# Patient Record
Sex: Female | Born: 1935 | Race: Black or African American | Hispanic: No | Smoking: Former smoker
Health system: Southern US, Community
[De-identification: ages and names within clinical notes are randomized; demographics above are authoritative.]

## PROBLEM LIST (undated history)

## (undated) DIAGNOSIS — M1990 Unspecified osteoarthritis, unspecified site: Secondary | ICD-10-CM

## (undated) DIAGNOSIS — G629 Polyneuropathy, unspecified: Secondary | ICD-10-CM

## (undated) DIAGNOSIS — E785 Hyperlipidemia, unspecified: Secondary | ICD-10-CM

## (undated) DIAGNOSIS — R011 Cardiac murmur, unspecified: Secondary | ICD-10-CM

## (undated) DIAGNOSIS — E039 Hypothyroidism, unspecified: Secondary | ICD-10-CM

## (undated) DIAGNOSIS — K5792 Diverticulitis of intestine, part unspecified, without perforation or abscess without bleeding: Secondary | ICD-10-CM

## (undated) DIAGNOSIS — R0600 Dyspnea, unspecified: Secondary | ICD-10-CM

## (undated) DIAGNOSIS — Z972 Presence of dental prosthetic device (complete) (partial): Secondary | ICD-10-CM

## (undated) DIAGNOSIS — Z950 Presence of cardiac pacemaker: Secondary | ICD-10-CM

## (undated) DIAGNOSIS — M109 Gout, unspecified: Secondary | ICD-10-CM

## (undated) DIAGNOSIS — K219 Gastro-esophageal reflux disease without esophagitis: Secondary | ICD-10-CM

## (undated) DIAGNOSIS — I509 Heart failure, unspecified: Secondary | ICD-10-CM

## (undated) DIAGNOSIS — F32A Depression, unspecified: Secondary | ICD-10-CM

## (undated) DIAGNOSIS — F329 Major depressive disorder, single episode, unspecified: Secondary | ICD-10-CM

## (undated) DIAGNOSIS — I10 Essential (primary) hypertension: Secondary | ICD-10-CM

## (undated) DIAGNOSIS — K56609 Unspecified intestinal obstruction, unspecified as to partial versus complete obstruction: Secondary | ICD-10-CM

## (undated) DIAGNOSIS — I38 Endocarditis, valve unspecified: Secondary | ICD-10-CM

## (undated) HISTORY — PX: ABDOMINAL SURGERY: SHX537

## (undated) HISTORY — PX: THYROIDECTOMY: SHX17

## (undated) HISTORY — PX: HERNIA REPAIR: SHX51

## (undated) HISTORY — PX: COLON SURGERY: SHX602

## (undated) HISTORY — PX: PACEMAKER INSERTION: SHX728

## (undated) HISTORY — PX: ABDOMINAL HYSTERECTOMY: SHX81

---

## 2004-04-09 ENCOUNTER — Ambulatory Visit: Payer: Self-pay | Admitting: Gastroenterology

## 2004-10-24 ENCOUNTER — Ambulatory Visit: Payer: Self-pay | Admitting: Family Medicine

## 2005-01-08 ENCOUNTER — Ambulatory Visit: Payer: Self-pay | Admitting: Family Medicine

## 2005-11-16 ENCOUNTER — Emergency Department: Payer: Self-pay | Admitting: Internal Medicine

## 2005-11-16 ENCOUNTER — Other Ambulatory Visit: Payer: Self-pay

## 2006-04-16 ENCOUNTER — Ambulatory Visit: Payer: Self-pay | Admitting: Family Medicine

## 2007-05-20 ENCOUNTER — Ambulatory Visit: Payer: Self-pay | Admitting: Family Medicine

## 2008-05-30 ENCOUNTER — Ambulatory Visit: Payer: Self-pay | Admitting: Cardiology

## 2008-07-19 ENCOUNTER — Ambulatory Visit: Payer: Self-pay | Admitting: Family Medicine

## 2009-01-08 ENCOUNTER — Observation Stay: Payer: Self-pay | Admitting: Internal Medicine

## 2009-07-20 ENCOUNTER — Ambulatory Visit: Payer: Self-pay | Admitting: Family Medicine

## 2014-03-29 ENCOUNTER — Inpatient Hospital Stay: Payer: Self-pay | Admitting: Surgery

## 2014-03-29 LAB — CBC WITH DIFFERENTIAL/PLATELET
BASOS ABS: 0.3 10*3/uL — AB (ref 0.0–0.1)
Basophil %: 1.8 %
Eosinophil #: 0.1 10*3/uL (ref 0.0–0.7)
Eosinophil %: 0.6 %
HCT: 46.7 % (ref 35.0–47.0)
HGB: 14.8 g/dL (ref 12.0–16.0)
LYMPHS ABS: 0.6 10*3/uL — AB (ref 1.0–3.6)
Lymphocyte %: 3.1 %
MCH: 30.7 pg (ref 26.0–34.0)
MCHC: 31.7 g/dL — ABNORMAL LOW (ref 32.0–36.0)
MCV: 97 fL (ref 80–100)
MONO ABS: 0.9 x10 3/mm (ref 0.2–0.9)
MONOS PCT: 4.8 %
Neutrophil #: 16.2 10*3/uL — ABNORMAL HIGH (ref 1.4–6.5)
Neutrophil %: 89.7 %
PLATELETS: 252 10*3/uL (ref 150–440)
RBC: 4.82 10*6/uL (ref 3.80–5.20)
RDW: 13.5 % (ref 11.5–14.5)
WBC: 18.1 10*3/uL — ABNORMAL HIGH (ref 3.6–11.0)

## 2014-03-29 LAB — COMPREHENSIVE METABOLIC PANEL
ALBUMIN: 4.1 g/dL (ref 3.4–5.0)
Alkaline Phosphatase: 78 U/L
Anion Gap: 10 (ref 7–16)
BUN: 18 mg/dL (ref 7–18)
Bilirubin,Total: 0.7 mg/dL (ref 0.2–1.0)
CALCIUM: 9.9 mg/dL (ref 8.5–10.1)
CO2: 20 mmol/L — AB (ref 21–32)
CREATININE: 1.35 mg/dL — AB (ref 0.60–1.30)
Chloride: 103 mmol/L (ref 98–107)
EGFR (African American): 48 — ABNORMAL LOW
EGFR (Non-African Amer.): 39 — ABNORMAL LOW
Glucose: 171 mg/dL — ABNORMAL HIGH (ref 65–99)
OSMOLALITY: 272 (ref 275–301)
POTASSIUM: 4.2 mmol/L (ref 3.5–5.1)
SGOT(AST): 22 U/L (ref 15–37)
SGPT (ALT): 20 U/L
SODIUM: 133 mmol/L — AB (ref 136–145)
Total Protein: 8.6 g/dL — ABNORMAL HIGH (ref 6.4–8.2)

## 2014-03-29 LAB — LIPASE, BLOOD: Lipase: 154 U/L (ref 73–393)

## 2014-03-29 LAB — TROPONIN I: Troponin-I: <0.02 ng/mL

## 2014-03-30 LAB — URINALYSIS, COMPLETE
Bacteria: NONE SEEN
Bilirubin,UR: NEGATIVE
GLUCOSE, UR: NEGATIVE mg/dL (ref 0–75)
Hyaline Cast: 2
Ketone: NEGATIVE
LEUKOCYTE ESTERASE: NEGATIVE
Nitrite: NEGATIVE
PROTEIN: NEGATIVE
Ph: 5 (ref 4.5–8.0)
RBC,UR: 47 /HPF (ref 0–5)
Specific Gravity: 1.026 (ref 1.003–1.030)
Squamous Epithelial: <1
WBC UR: 8 /HPF (ref 0–5)

## 2014-03-30 LAB — CBC WITH DIFFERENTIAL/PLATELET
Basophil #: 0 10*3/uL (ref 0.0–0.1)
Basophil %: 0.2 %
Eosinophil #: 0 10*3/uL (ref 0.0–0.7)
Eosinophil %: 0.1 %
HCT: 36.5 % (ref 35.0–47.0)
HGB: 12 g/dL (ref 12.0–16.0)
Lymphocyte #: 1.2 10*3/uL (ref 1.0–3.6)
Lymphocyte %: 10.3 %
MCH: 31.4 pg (ref 26.0–34.0)
MCHC: 32.8 g/dL (ref 32.0–36.0)
MCV: 96 fL (ref 80–100)
Monocyte #: 1.6 x10 3/mm — ABNORMAL HIGH (ref 0.2–0.9)
Monocyte %: 14 %
NEUTROS PCT: 75.4 %
Neutrophil #: 8.6 10*3/uL — ABNORMAL HIGH (ref 1.4–6.5)
PLATELETS: 216 10*3/uL (ref 150–440)
RBC: 3.82 10*6/uL (ref 3.80–5.20)
RDW: 13.5 % (ref 11.5–14.5)
WBC: 11.4 10*3/uL — ABNORMAL HIGH (ref 3.6–11.0)

## 2014-03-30 LAB — BASIC METABOLIC PANEL
Anion Gap: 7 (ref 7–16)
BUN: 24 mg/dL — AB (ref 7–18)
CREATININE: 1.43 mg/dL — AB (ref 0.60–1.30)
Calcium, Total: 8.2 mg/dL — ABNORMAL LOW (ref 8.5–10.1)
Chloride: 109 mmol/L — ABNORMAL HIGH (ref 98–107)
Co2: 26 mmol/L (ref 21–32)
EGFR (Non-African Amer.): 37 — ABNORMAL LOW
GFR CALC AF AMER: 45 — AB
GLUCOSE: 110 mg/dL — AB (ref 65–99)
Osmolality: 288 (ref 275–301)
POTASSIUM: 3.9 mmol/L (ref 3.5–5.1)
SODIUM: 142 mmol/L (ref 136–145)

## 2014-04-03 LAB — PLATELET COUNT: Platelet: 265 10*3/uL (ref 150–440)

## 2014-04-03 LAB — CBC WITH DIFFERENTIAL/PLATELET
BASOS PCT: 0.1 %
Basophil #: 0 10*3/uL (ref 0.0–0.1)
Eosinophil #: 0 10*3/uL (ref 0.0–0.7)
Eosinophil %: 0.1 %
HCT: 37.1 % (ref 35.0–47.0)
HGB: 11.9 g/dL — ABNORMAL LOW (ref 12.0–16.0)
Lymphocyte #: 1.1 10*3/uL (ref 1.0–3.6)
Lymphocyte %: 7.8 %
MCH: 30.5 pg (ref 26.0–34.0)
MCHC: 32.2 g/dL (ref 32.0–36.0)
MCV: 95 fL (ref 80–100)
MONOS PCT: 14.4 %
Monocyte #: 2 x10 3/mm — ABNORMAL HIGH (ref 0.2–0.9)
Neutrophil #: 10.9 10*3/uL — ABNORMAL HIGH (ref 1.4–6.5)
Neutrophil %: 77.6 %
Platelet: 271 10*3/uL (ref 150–440)
RBC: 3.92 10*6/uL (ref 3.80–5.20)
RDW: 12.9 % (ref 11.5–14.5)
WBC: 14.1 10*3/uL — AB (ref 3.6–11.0)

## 2014-04-03 LAB — BASIC METABOLIC PANEL
ANION GAP: 12 (ref 7–16)
BUN: 19 mg/dL — AB (ref 7–18)
Calcium, Total: 8.5 mg/dL (ref 8.5–10.1)
Chloride: 105 mmol/L (ref 98–107)
Co2: 24 mmol/L (ref 21–32)
Creatinine: 0.9 mg/dL (ref 0.60–1.30)
EGFR (Non-African Amer.): >60
GLUCOSE: 110 mg/dL — AB (ref 65–99)
Osmolality: 284 (ref 275–301)
Potassium: 3.3 mmol/L — ABNORMAL LOW (ref 3.5–5.1)
Sodium: 141 mmol/L (ref 136–145)

## 2014-04-03 LAB — PHOSPHORUS: PHOSPHORUS: 2.5 mg/dL (ref 2.5–4.9)

## 2014-04-03 LAB — MAGNESIUM: Magnesium: 1.7 mg/dL — ABNORMAL LOW

## 2014-04-04 LAB — CBC WITH DIFFERENTIAL/PLATELET
Bands: 1 %
HCT: 33.6 % — ABNORMAL LOW (ref 35.0–47.0)
HGB: 10.9 g/dL — AB (ref 12.0–16.0)
LYMPHS PCT: 10 %
MCH: 30.9 pg (ref 26.0–34.0)
MCHC: 32.4 g/dL (ref 32.0–36.0)
MCV: 96 fL (ref 80–100)
Monocytes: 16 %
Platelet: 259 10*3/uL (ref 150–440)
RBC: 3.52 10*6/uL — AB (ref 3.80–5.20)
RDW: 13.3 % (ref 11.5–14.5)
Segmented Neutrophils: 73 %
WBC: 16 10*3/uL — ABNORMAL HIGH (ref 3.6–11.0)

## 2014-04-04 LAB — BASIC METABOLIC PANEL
ANION GAP: 10 (ref 7–16)
BUN: 20 mg/dL — ABNORMAL HIGH (ref 7–18)
CALCIUM: 8 mg/dL — AB (ref 8.5–10.1)
CREATININE: 0.81 mg/dL (ref 0.60–1.30)
Chloride: 105 mmol/L (ref 98–107)
Co2: 26 mmol/L (ref 21–32)
Glucose: 93 mg/dL (ref 65–99)
Osmolality: 284 (ref 275–301)
Potassium: 3 mmol/L — ABNORMAL LOW (ref 3.5–5.1)
Sodium: 141 mmol/L (ref 136–145)

## 2014-04-05 LAB — BASIC METABOLIC PANEL
ANION GAP: 9 (ref 7–16)
BUN: 18 mg/dL (ref 7–18)
CALCIUM: 8.1 mg/dL — AB (ref 8.5–10.1)
CHLORIDE: 106 mmol/L (ref 98–107)
CO2: 27 mmol/L (ref 21–32)
Creatinine: 0.81 mg/dL (ref 0.60–1.30)
EGFR (Non-African Amer.): >60
GLUCOSE: 105 mg/dL — AB (ref 65–99)
OSMOLALITY: 285 (ref 275–301)
Potassium: 3.4 mmol/L — ABNORMAL LOW (ref 3.5–5.1)
Sodium: 142 mmol/L (ref 136–145)

## 2014-04-06 LAB — CBC WITH DIFFERENTIAL/PLATELET
Basophil #: 0.1 10*3/uL (ref 0.0–0.1)
Basophil %: 0.4 %
Eosinophil #: 0.4 10*3/uL (ref 0.0–0.7)
Eosinophil %: 2 %
HCT: 34.4 % — ABNORMAL LOW (ref 35.0–47.0)
HGB: 11.2 g/dL — ABNORMAL LOW (ref 12.0–16.0)
Lymphocyte #: 2.1 10*3/uL (ref 1.0–3.6)
Lymphocyte %: 10.9 %
MCH: 31 pg (ref 26.0–34.0)
MCHC: 32.5 g/dL (ref 32.0–36.0)
MCV: 95 fL (ref 80–100)
Monocyte #: 2.7 x10 3/mm — ABNORMAL HIGH (ref 0.2–0.9)
Monocyte %: 14.1 %
Neutrophil #: 13.7 10*3/uL — ABNORMAL HIGH (ref 1.4–6.5)
Neutrophil %: 72.6 %
Platelet: 354 10*3/uL (ref 150–440)
RBC: 3.61 10*6/uL — ABNORMAL LOW (ref 3.80–5.20)
RDW: 13.1 % (ref 11.5–14.5)
WBC: 18.9 10*3/uL — ABNORMAL HIGH (ref 3.6–11.0)

## 2014-04-06 LAB — MAGNESIUM: Magnesium: 1.9 mg/dL

## 2014-04-06 LAB — POTASSIUM: Potassium: 3.6 mmol/L (ref 3.5–5.1)

## 2014-08-03 ENCOUNTER — Ambulatory Visit
Admit: 2014-08-03 | Disposition: A | Discharge status: Home / Self Care | Payer: Self-pay | Attending: Surgery | Admitting: Surgery

## 2014-08-16 ENCOUNTER — Ambulatory Visit
Admit: 2014-08-16 | Disposition: A | Discharge status: Home / Self Care | Payer: Self-pay | Attending: Surgery | Admitting: Surgery

## 2014-08-26 NOTE — Discharge Summary (Signed)
PATIENT NAME:  Vanessa CorwinBENSON, Kadesha M MR#:  [id number] DATE OF BIRTH:  Aug 01, 1922  DATE OF ADMISSION:  03/29/2014 DATE OF DISCHARGE: 04/06/2014  FINAL DIAGNOSES: Incarcerated right femoral hernia with a small bowel obstruction, hypertension, coronary artery disease.   PRINCIPAL PROCEDURE: Exploratory laparotomy with small bowel resection and McVay repair of incarcerated right femoral hernia.   CONSULTS: Medicine consultation.   HOSPITAL COURSE SUMMARY: The patient was brought to the operating room urgently the day of her admission. Her postoperative course was unremarkable except for some nausea and vomiting. X-rays were unrevealing. The patient had hypertension, and was seen by internal medicine. No real changes were made. Blood pressure was reasonably controlled. The patient continued to improve. On postoperative day number 7, she was tolerating a regular diet and had oral pain medications by mouth. The wound was healing nicely and she was participating in physical therapy and was deemed a suitable candidate for rehabilitation transfer.   DISCHARGE MEDICATIONS: Medication reconciliation form was done.   FOLLOWUP INSTRUCTIONS: Follow up in our office in 1 week for staple removal. Call with any questions or concerns.   CONDITION ON DISCHARGE: Stable and improved.     ____________________________ Redge GainerMark A. Egbert GaribaldiBird, MD mab:JT D: 04/06/2014 08:54:14 ET T: 04/06/2014 09:04:18 ET JOB#: [id number]  cc: Loraine LericheMark A. Egbert GaribaldiBird, MD, <Dictator> Deniesha Stenglein A Doriann Zuch MD ELECTRONICALLY SIGNED 04/08/2014 17:10

## 2014-08-26 NOTE — Consult Note (Signed)
PATIENT NAME:  Vanessa CorwinBENSON, Crissa M MR#:  [id number] DATE OF BIRTH:  November 17, 1922  DATE OF CONSULTATION:  04/03/2014  REFERRING PHYSICIAN:   CONSULTING PHYSICIAN:  Katha HammingSnehalatha Tara Wich, MD  PRIMARY DOCTOR:  Dr. Terance HartBronstein.    CONSULT REQUESTING PHYSICIAN:  Dr. Egbert GaribaldiBird.    REASON FOR CONSULT: Management of hypertension.   HISTORY OF PRESENT ILLNESS: An 81 year old female patient admitted to surgical service on November 25 for incarcerated inguinal hernia. The patient admitted to surgical service and she had an exploratory laparotomy with hernia repair and small bowel resection on November 25. The patient is on morphine PCA for pain control and IV fluids. We were consulted for management of hypertension. The patient's blood pressure has been around 115/80 and 164/80.  It is overall well controlled since yesterday, but since this morning it is a little bit elevated at 167/80. The patient says that she is feeling more nauseous today than previous days. Unable to eat anything for 5 days.  Does not have abdominal pain, and able to pass flatus.    MEDICATIONS:  The patient's hypertensive medications includes lisinopril 40 mg daily, Imdur 30 mg daily, atenolol 50 mg daily, amlodipine 5 mg a day at this time.    ALLERGIES:  INDERAL.    SOCIAL HISTORY:  No smoking.  No drinking.  The patient lives alone.   PAST SURGICAL HISTORY: Significant for hysterectomy, pacemaker placement. She had history of thyroidectomy.   FAMILY HISTORY: No hypertension or diabetes.   REVIEW OF SYSTEMS:    CONSTITUTIONAL: The patient denies any fever.  EARS, NOSE, AND THROAT: The patient denies any ear pain. No epistaxis. No difficulty swallowing.  CARDIOVASCULAR: No chest pain. No palpitations or syncope.  PULMONOLOGY: Denies any trouble breathing.  GASTROINTESTINAL:  Has nausea and decreased p.o. intake. Denies abdominal pain. She complains of soreness only.  GENITOURINARY:  No dysuria.  ENDOCRINE: No polyuria or nocturia.   PSYCHIATRIC: No anxiety or insomnia.  NEUROLOGIC: No history of strokes.   PHYSICAL EXAMINATION:   VITAL SIGNS:  Temperature 98.8, heart rate 64, blood pressure 164/80, saturation 95% on 1 liter.  GENERAL:  She is alert, awake, oriented, pleasant female, slightly in distress secondary to nausea, able to questions appropriately.  HEAD: Atraumatic, normocephalic.  EYES: Pupils equal, reacting to light. Extraocular movements are intact.  EARS, NOSE, AND THROAT: No tympanic membrane congestion. No turbinate hypertrophy. No erythema.   NECK: Supple. No JVD.  No carotid bruit.  CARDIOVASCULAR: S1, S2 regular. No murmurs.  LUNGS: Clear to auscultation. No wheeze, no rales.  ABDOMEN:  The patient abdominal mesh present. Bowel sounds are diminished. Nontender, nondistended.  EXTREMITIES: No extremity edema. No cyanosis, no clubbing.  NEUROLOGIC: Alert, awake, oriented. Cranial nerves II through XII intact. Power 5 out of 5 in upper and lower extremities. Sensation is intact. DTRs 2 + bilaterally.  PSYCHIATRIC: Mood and affect are within normal limits.   LABORATORY DATA: The patient laboratories done on November 26, white count was 11.4. Electrolytes at that time showed sodium of 142, potassium 3.9, chloride 109, bicarbonate 26, BUN 24, creatinine 1.43. No recent laboratories are available.  The patient had abdominal x-ray today and it showed   Improved bowel gas pattern, but possible continued dilation ofthe stomach. Consider NG tube decompression. 3. No free air identified.  ASSESSMENT AND PLAN:  1.  This is an 81 year old female with hypertension. The patient right now is on atenolol and amlodipine, lisinopril and Imdur.  I would recommend to continue the same dose  at this time and watch and make further recommendations accordingly. Blood pressure is slightly elevated likely secondary to her nausea.  2.  Small incarcerated hernia status post repair. Right now she is off morphine PCA since today  morning and continue Reglan, PPIs and nausea medication and see how she does.  We could increase amlodipine to 10 mg depending on the blood pressure, if blood pressure is high we  could increase to 10 daily from tomorrow. At this time continue same dose of lisinopril, Imdur, atenolol, and amlodipine.  Obtain followup laboratories of CBC and  BMP  for tomorrow.  TIME SPENT ON THE CONSULTATION: 55 minutes.     ____________________________ Katha Hamming, MD sk:bu D: 04/03/2014 12:51:01 ET T: 04/03/2014 13:19:26 ET JOB#: [id number]  cc: Katha Hamming, MD, <Dictator> Katha Hamming MD ELECTRONICALLY SIGNED 04/29/2014 19:41

## 2014-08-26 NOTE — Op Note (Signed)
PATIENT NAME:  Vanessa Lang, FOISTER MR#:  [id number] DATE OF BIRTH:  02-03-1923  DATE OF PROCEDURE:  03/29/2014  ATTENDING SURGEON:  Dr. Salome Holmes.   PREOPERATIVE DIAGNOSIS: Small bowel obstruction due to incarcerated right inguinal hernia.   POSTOPERATIVE DIAGNOSIS:  Small bowel obstruction with strangulated right femoral hernia.   PROCEDURES PERFORMED:   1.  Primary right inguinal and femoral hernia repair using McVay technique for strangulated hernia.  2.  Exploratory laparotomy.   3.  Small bowel resection.    INDICATION FOR SURGERY:  Vanessa Lang is a pleasant 81 year old female who presented with right groin pain, nausea, and vomiting. She had a CT scan which was concerning for a small bowel obstruction secondary to incarcerated right inguinal hernia.   ANESTHESIA: General.   ESTIMATED BLOOD LOSS: 50 mL.   COMPLICATIONS: None.   SPECIMENS: Strangulated small bowel with necrosis.   DETAILS OF PROCEDURE: Informed consent was obtained.  Vanessa Lang was brought to the operating suite.  She was induced, endotracheal tube was placed.  General anesthesia was administered and her right groin and abdomen were prepped and draped in standard surgical fashion. A timeout was then performed, correctly identifying the patient name, operative site, and procedure to be performed.  An incision was made over her right groin.  It was deepened down to the fascia, the aponeurosis of the external oblique underneath the  inguinal ligament. There was a hard area of incarcerated what was thought to be bowel. The sac was dissected out. There was a purple-brown-black knuckle of bowel in her right lower quadrant. Her femoral canal aponeurosis was opened.  The round ligament was ligated and the floor of the canal was opened and the peritoneum was opened.  I did encounter a piece of bowel which was unable to be reduced.  The inguinal ligament was ligated, but it still was unable to be reduce.  I was concerned with the  questionable viability of the bowel and concern that resection would have to occur.  I thus made a midline incision and this was deepened down to the fascia. The fascia was incised. The peritoneum was entered. I then followed the small bowel to the area of strangulation with some work. I was able to reduce the small bowel.  Next the attention was made on repairing the hernia.  Because of the possible necrosis of small bowel a decision was made not to use mesh.  I then repaired the inguinal ligament using interrupted 3-0 Prolene.  I then performed a McVay tissue repair suturing the conjoined tendon to the [employer] ligament. I used a running 0 Prolene and ran, continued this closure to the shelving edge of the inguinal ligament which was quite destroyed due to the chronicity of the femoral hernia. I then made a transition stitch between the conjoined tendon and Cooper ligament and the shelving edge of the inguinal ligament and then continued the closure by joining the shelving edge to the conjoined tendon. The internal ring was closed. I did feel a small defect at the inferior medial aspect of my closure and did a simple interrupted 0 Ethibond to close the defect between the [place] ligament and the conjoined tendon. After I was satisfied with the closure I closed the aponeurosis of the external oblique using a running 3-0 Vicryl. I then closed Scarpa fascia with a running 3-0 Vicryl. I then closed the wound with stitches. Sterile dressing was then placed over the wound. I then proceeded to look at the abdomen  and investigate the incarcerated knuckle of bowel that was black and purple and I was afraid of its viability, therefore I opted to do a small bowel resection. A side-to-side functional end-to-end small bowel resection of the strangulated necrotic bowel was performed. It was excised using 2 staple loads, the GIA 75 with a blue load stapler at the proximal and distal aspects of it, the mesentery was ligated with  Kelly clamps and suture ties. The specimen was then sent off. I then brought the 2 ends together in proximity side by side. I placed 2 stay sutures of 3-0 Vicryl to hold them in place and then removed the tips of the staple line to provide an enterotomy. A GIA 75 load was used to create a common channel. I then used a TX 60 to close the common enterotomy.  The mesenteric defect was quite small and  I did not close it.  I then returned the bowel to the abdomen. The abdomen was then irrigated with multiple liters of normal saline and then the fascia was closed with a running looped PDS ran from cephalad to caudate and then tied to itself. The wound was then stapled. Sterile dressings were then placed over both wounds. The patient was then awoken, extubated, and brought to the postanesthesia care unit. There were no immediate complications. Needle, sponge, and instrument counts were correct at the end of the procedure.    ____________________________ Si Raiderhristopher A. Sandrina Heaton, MD cal:bu D: 03/30/2014 09:05:00 ET T: 03/30/2014 12:54:23 ET JOB#: [id number]  cc: Cristal Deerhristopher A. Cassandria Drew, MD, <Dictator> Jarvis NewcomerHRISTOPHER A Zarya Lasseigne MD ELECTRONICALLY SIGNED 04/10/2014 20:10

## 2014-08-26 NOTE — Consult Note (Signed)
Brief Consult Note: Diagnosis: 81 yr old female with i incarcerated ingunal hernia s/p repair, medical ocnsult for Hypertension management.   Patient was seen by consultant.   Consult note dictated.   Recommend to proceed with surgery or procedure.   Comments: 81 yr old female with Hypertension;overall well controlled,slighly elevated today likley due to Intractable nausea; continue amlodipine 5mg  daily,atenolol 50 mg po daily,lisinopril 40 mg po dailu and imdur 30 mg po daily.can increase amlodipine to 5mg  if BP is persistently high; monitor labs.  Electronic Signatures: Katha HammingKonidena, Giavanni Odonovan (MD)  (Signed [phone]-Nov-15 12:57)  Authored: Brief Consult Note   Last Updated: 30-Nov-15 12:57 by Katha HammingKonidena, Lyndsy Gilberto (MD)

## 2014-08-26 NOTE — H&P (Signed)
   Subjective/Chief Complaint Abdominal pain/Nausea/vomiting   History of Present Illness Ms. Vanessa Lang is Lang pleasant 81 yo F with Lang PMH of CAD, HTN and prior pacemaker placement who presents with approx 1 day of RLQ pain.  Began acutely and has not improved.  Began N/V at approx 1 am this am.  No fevers/chills.  CT shows Right inguinal hernia with incarcerated bowel with transition point.  No sick contacts, no unusual ingestions.   Past History H/O CAD H/o pacemaker HTN H/o appendectomy H/o thyroid surgery H/o hysterectomy   Code Status Full Code   Past Med/Surgical Hx:  Pacemaker:   Hypothyroidism:   Silent MI:   HTN:   Appendectomy:   thyroid surgery:   hysterectomy:   ALLERGIES:  Inderal: Itching, Rash  Family and Social History:  Family History Non-Contributory   Social History negative tobacco, negative ETOH   Place of Living Home   Review of Systems:  Subjective/Chief Complaint RLQ pain, N/V, right groin bulge   Fever/Chills No   Cough No   Abdominal Pain Yes   Diarrhea No   Constipation No   Nausea/Vomiting Yes   SOB/DOE No   Chest Pain No   Dysuria No   Tolerating Diet No  Nauseated  Vomiting   Physical Exam:  GEN well developed, well nourished, no acute distress   HEENT pink conjunctivae, PERRL, good dentition   RESP normal resp effort  clear BS  no use of accessory muscles   CARD regular rate  no murmur  no thrills   ABD positive tenderness  positive hernia  soft  distended  no Abdominal Bruits  no Adominal Mass  Unreducible right groin bulge   LYMPH negative neck, negative axillae   EXTR negative cyanosis/clubbing, negative edema   SKIN normal to palpation, No rashes   NEURO cranial nerves intact, follows commands, strength:, motor/sensory function intact   PSYCH Lang+O to time, place, person, good insight    Assessment/Admission Diagnosis 81 yo with R groin pain/N/V.  CT shows SBO from incarcerated right inguinal hernia, possibly  femoral on exam.  Elevated WBC, concern for incarceration/strangulation.   Plan Admit, plan on OR for emergent repair of incarcerated possibly strangulated hernia   Electronic Signatures: Vanessa Lang, Vanessa Lang (MD)  (Signed [phone]-Nov-15 13:35)  Authored: CHIEF COMPLAINT and HISTORY, PAST MEDICAL/SURGIAL HISTORY, ALLERGIES, FAMILY AND SOCIAL HISTORY, REVIEW OF SYSTEMS, PHYSICAL EXAM, ASSESSMENT AND PLAN   Last Updated: 25-Nov-15 13:35 by Vanessa Lang, Vanessa Lang (MD)

## 2014-08-28 LAB — SURGICAL PATHOLOGY

## 2015-05-26 ENCOUNTER — Inpatient Hospital Stay
Admission: EM | Admit: 2015-05-26 | Discharge: 2015-05-29 | DRG: 378 | Disposition: A | Discharge status: Home / Self Care | Payer: Medicare Other | Attending: Internal Medicine | Admitting: Internal Medicine

## 2015-05-26 ENCOUNTER — Encounter: Payer: Self-pay | Admitting: Internal Medicine

## 2015-05-26 DIAGNOSIS — Z8249 Family history of ischemic heart disease and other diseases of the circulatory system: Secondary | ICD-10-CM

## 2015-05-26 DIAGNOSIS — K5791 Diverticulosis of intestine, part unspecified, without perforation or abscess with bleeding: Principal | ICD-10-CM | POA: Diagnosis present

## 2015-05-26 DIAGNOSIS — E861 Hypovolemia: Secondary | ICD-10-CM | POA: Diagnosis present

## 2015-05-26 DIAGNOSIS — I10 Essential (primary) hypertension: Secondary | ICD-10-CM | POA: Diagnosis present

## 2015-05-26 DIAGNOSIS — Z79899 Other long term (current) drug therapy: Secondary | ICD-10-CM

## 2015-05-26 DIAGNOSIS — E785 Hyperlipidemia, unspecified: Secondary | ICD-10-CM | POA: Diagnosis present

## 2015-05-26 DIAGNOSIS — I5032 Chronic diastolic (congestive) heart failure: Secondary | ICD-10-CM | POA: Diagnosis present

## 2015-05-26 DIAGNOSIS — E890 Postprocedural hypothyroidism: Secondary | ICD-10-CM | POA: Diagnosis present

## 2015-05-26 DIAGNOSIS — Z888 Allergy status to other drugs, medicaments and biological substances status: Secondary | ICD-10-CM | POA: Diagnosis not present

## 2015-05-26 DIAGNOSIS — N179 Acute kidney failure, unspecified: Secondary | ICD-10-CM | POA: Diagnosis present

## 2015-05-26 DIAGNOSIS — D62 Acute posthemorrhagic anemia: Secondary | ICD-10-CM | POA: Diagnosis present

## 2015-05-26 DIAGNOSIS — N189 Chronic kidney disease, unspecified: Secondary | ICD-10-CM | POA: Diagnosis present

## 2015-05-26 DIAGNOSIS — Z87891 Personal history of nicotine dependence: Secondary | ICD-10-CM | POA: Diagnosis not present

## 2015-05-26 DIAGNOSIS — E039 Hypothyroidism, unspecified: Secondary | ICD-10-CM | POA: Diagnosis present

## 2015-05-26 DIAGNOSIS — M109 Gout, unspecified: Secondary | ICD-10-CM | POA: Diagnosis present

## 2015-05-26 DIAGNOSIS — K922 Gastrointestinal hemorrhage, unspecified: Secondary | ICD-10-CM | POA: Diagnosis present

## 2015-05-26 DIAGNOSIS — Z950 Presence of cardiac pacemaker: Secondary | ICD-10-CM

## 2015-05-26 DIAGNOSIS — Z800 Family history of malignant neoplasm of digestive organs: Secondary | ICD-10-CM

## 2015-05-26 DIAGNOSIS — Z90710 Acquired absence of both cervix and uterus: Secondary | ICD-10-CM | POA: Diagnosis not present

## 2015-05-26 HISTORY — DX: Depression, unspecified: F32.A

## 2015-05-26 HISTORY — DX: Essential (primary) hypertension: I10

## 2015-05-26 HISTORY — DX: Hypothyroidism, unspecified: E03.9

## 2015-05-26 HISTORY — DX: Gout, unspecified: M10.9

## 2015-05-26 HISTORY — DX: Heart failure, unspecified: I50.9

## 2015-05-26 HISTORY — DX: Hyperlipidemia, unspecified: E78.5

## 2015-05-26 HISTORY — DX: Major depressive disorder, single episode, unspecified: F32.9

## 2015-05-26 LAB — COMPREHENSIVE METABOLIC PANEL
ALBUMIN: 4.4 g/dL (ref 3.5–5.0)
ALK PHOS: 61 U/L (ref 38–126)
ALT: 14 U/L (ref 14–54)
ANION GAP: 10 (ref 5–15)
AST: 17 U/L (ref 15–41)
BUN: 22 mg/dL — ABNORMAL HIGH (ref 6–20)
CALCIUM: 9.4 mg/dL (ref 8.9–10.3)
CHLORIDE: 106 mmol/L (ref 101–111)
CO2: 22 mmol/L (ref 22–32)
Creatinine, Ser: 1.17 mg/dL — ABNORMAL HIGH (ref 0.44–1.00)
GFR calc Af Amer: 46 mL/min — ABNORMAL LOW (ref >60)
GFR calc non Af Amer: 40 mL/min — ABNORMAL LOW (ref >60)
GLUCOSE: 137 mg/dL — AB (ref 65–99)
Potassium: 3.9 mmol/L (ref 3.5–5.1)
SODIUM: 138 mmol/L (ref 135–145)
Total Bilirubin: 0.7 mg/dL (ref 0.3–1.2)
Total Protein: 7.9 g/dL (ref 6.5–8.1)

## 2015-05-26 LAB — CBC
HCT: 37.1 % (ref 35.0–47.0)
HEMOGLOBIN: 11.9 g/dL — AB (ref 12.0–16.0)
MCH: 30.2 pg (ref 26.0–34.0)
MCHC: 32.1 g/dL (ref 32.0–36.0)
MCV: 94.2 fL (ref 80.0–100.0)
Platelets: 251 10*3/uL (ref 150–440)
RBC: 3.94 MIL/uL (ref 3.80–5.20)
RDW: 13.3 % (ref 11.5–14.5)
WBC: 12.2 10*3/uL — ABNORMAL HIGH (ref 3.6–11.0)

## 2015-05-26 LAB — ABO/RH: ABO/RH(D): B POS

## 2015-05-26 MED ORDER — SODIUM CHLORIDE 0.9 % IV SOLN
Freq: Once | INTRAVENOUS | Status: AC
  Administered 2015-05-26: 23:00:00 via INTRAVENOUS

## 2015-05-26 NOTE — ED Provider Notes (Signed)
[facility] Emergency Department Provider Note  Time seen: 9:47 PM  I have reviewed the triage vital signs and the nursing notes.   HISTORY  Chief Complaint Rectal Bleeding    HPI Vanessa Lang is a 82 y.o. female with a past medical history of hypertension, hypothyroidism, who presents to the emergency department with rectal bleeding 2-3 hours. According to the patient for the past 2-3 hours she has been having loose stool, she looked down and saw that it was bright red blood. Denies any history of rectal bleeding a past take the baby aspirin but denies any other blood thinners. Denies any abdominal pain.Denies nausea or vomiting.     No past medical history on file.  There are no active problems to display for this patient.   No past surgical history on file.  Current Outpatient Rx  Name  Route  Sig  Dispense  Refill  . amLODipine (NORVASC) 5 MG tablet   Oral   Take 1 tablet by mouth daily.         Marland Kitchen atenolol (TENORMIN) 50 MG tablet   Oral   Take 1 tablet by mouth daily.         Marland Kitchen gabapentin (NEURONTIN) 100 MG capsule   Oral   Take 1 capsule by mouth 3 (three) times daily.         Marland Kitchen HYDROcodone-acetaminophen (NORCO/VICODIN) 5-325 MG tablet               . isosorbide mononitrate (IMDUR) 30 MG 24 hr tablet   Oral   Take 1 tablet by mouth daily.         Marland Kitchen lisinopril (PRINIVIL,ZESTRIL) 40 MG tablet   Oral   Take 1 tablet by mouth daily.         Marland Kitchen SYNTHROID 75 MCG tablet   Oral   Take 1 tablet by mouth daily.           Dispense as written.     Allergies Review of patient's allergies indicates no known allergies.  No family history on file.  Social History Social History  Substance Use Topics  . Smoking status: Not on file  . Smokeless tobacco: Not on file  . Alcohol Use: Not on file    Review of Systems Constitutional: Negative for fever Cardiovascular: Negative for chest pain. Respiratory: Negative for  shortness of breath. Gastrointestinal: Negative for abdominal pain. Positive rectal bleeding. Negative for nausea or vomiting. Musculoskeletal: Negative for back pain. Neurological: Negative for headache 10-point ROS otherwise negative.  ____________________________________________   PHYSICAL EXAM:  VITAL SIGNS: ED Triage Vitals  Enc Vitals Group     BP 05/26/15 2048 132/71 mmHg     Pulse Rate 05/26/15 2048 63     Resp 05/26/15 2048 20     Temp 05/26/15 2048 97.5 F (36.4 C)     Temp Source 05/26/15 2048 Oral     SpO2 05/26/15 2048 95 %     Weight 05/26/15 2048 149 lb (67.586 kg)     Height 05/26/15 2048  (1.778 m)     Head Cir --      Peak Flow --      Pain Score --      Pain Loc --      Pain Edu? --      Excl. in GC? --     Constitutional: Alert and oriented. Well appearing and in no distress. Eyes: Normal exam ENT   Head: Normocephalic and  atraumatic.   Mouth/Throat: Mucous membranes are moist. Cardiovascular: Normal rate, regular rhythm. No murmur Respiratory: Normal respiratory effort without tachypnea nor retractions. Breath sounds are clear  Gastrointestinal: Soft and nontender. No distention. Rectal exam shows gross red blood. Nontender. Musculoskeletal: Nontender with normal range of motion in all extremities. Neurologic:  Normal speech and language. No gross focal neurologic deficits Skin:  Skin is warm, dry and intact.  Psychiatric: Mood and affect are normal. Speech and behavior are normal.   ____________________________________________    INITIAL IMPRESSION / ASSESSMENT AND PLAN / ED COURSE  Pertinent labs & imaging results that were available during my care of the patient were reviewed by me and considered in my medical decision making (see chart for details).  Patient presents for 2-3 hours of rectal bleeding. Bright red blood on rectal exam. Bright red blood in toilet in the emergency department. Patient's labs show hemoglobin of 11.9.  We'll start IV fluids and closely monitor the patient. She'll need to be admitted to the hospital for lower GI bleed. Nontender abdomen. Suspect diverticulosis.  ____________________________________________   FINAL CLINICAL IMPRESSION(S) / ED DIAGNOSES  Lower GI bleed   Minna Antis, MD 05/26/15 2207

## 2015-05-26 NOTE — ED Notes (Signed)
Patient reports approximately 2 hours ago noticed bright red blood and clots in the toilet.

## 2015-05-26 NOTE — H&P (Signed)
[facility] - [place] at [facility]   PATIENT NAME: Vanessa Lang    MR#:  [id number]  DATE OF BIRTH:  [birth year]  DATE OF ADMISSION:  05/26/2015  PRIMARY CARE PHYSICIAN: Dorothey Baseman, MD   REQUESTING/REFERRING PHYSICIAN: Lenard Lance, MD  CHIEF COMPLAINT:   Chief Complaint  Patient presents with  . Rectal Bleeding    HISTORY OF PRESENT ILLNESS:  Vanessa Lang  is a 82 y.o. female who presents with GI bleed. Patient states that early this afternoon she had a bowel movement and noticed a fair amount of bright red blood. She then proceeded to have 4-5 more bowel movements at home. His bowel movements then turned darker with clots visible. She began to feel a little weak and came to the ED for evaluation. She had 2-3 more bloody bowel movements here. Lab workup is largely stable at this time. Vital signs are stable, including hemodynamics. Patient denies any abdominal pain or other symptoms. Denies any prior history of GI bleed. Hospitalists were called for admission  PAST MEDICAL HISTORY:   Past Medical History  Diagnosis Date  . HTN (hypertension)   . Hypothyroidism   . CHF (congestive heart failure) (HCC)   . Depression   . Gout   . HLD (hyperlipidemia)     PAST SURGICAL HISTORY:   Past Surgical History  Procedure Laterality Date  . Abdominal hysterectomy    . Pacemaker insertion    . Thyroidectomy      SOCIAL HISTORY:   Social History  Substance Use Topics  . Smoking status: Former Games developer  . Smokeless tobacco: Not on file  . Alcohol Use: No    FAMILY HISTORY:   Family History  Problem Relation Age of Onset  . Thyroid disease Mother   . Hypertension Sister   . Thyroid disease Sister   . Heart attack Brother   . Cancer Brother   . Colon cancer Sister     DRUG ALLERGIES:   Allergies  Allergen Reactions  . Propranolol     MEDICATIONS AT HOME:   Prior to Admission medications   Medication Sig Start Date End Date Taking?  Authorizing Provider  amLODipine (NORVASC) 5 MG tablet Take 1 tablet by mouth daily. 05/02/15  Yes Historical Provider, MD  atenolol (TENORMIN) 50 MG tablet Take 1 tablet by mouth daily. 03/22/15  Yes Historical Provider, MD  gabapentin (NEURONTIN) 100 MG capsule Take 1 capsule by mouth 3 (three) times daily. 03/22/15  Yes Historical Provider, MD  HYDROcodone-acetaminophen (NORCO/VICODIN) 5-325 MG tablet Take 1 tablet by mouth every 6 (six) hours as needed.  05/24/15  Yes Historical Provider, MD  isosorbide mononitrate (IMDUR) 30 MG 24 hr tablet Take 1 tablet by mouth daily. 05/11/15  Yes Historical Provider, MD  lisinopril (PRINIVIL,ZESTRIL) 40 MG tablet Take 1 tablet by mouth daily. 05/11/15  Yes Historical Provider, MD  SYNTHROID 75 MCG tablet Take 1 tablet by mouth daily. 05/22/15  Yes Historical Provider, MD    REVIEW OF SYSTEMS:  Review of Systems  Constitutional: Negative for fever, chills, weight loss and malaise/fatigue.  HENT: Negative for ear pain, hearing loss and tinnitus.   Eyes: Negative for blurred vision, double vision, pain and redness.  Respiratory: Negative for cough, hemoptysis and shortness of breath.   Cardiovascular: Negative for chest pain, palpitations, orthopnea and leg swelling.  Gastrointestinal: Positive for blood in stool. Negative for nausea, vomiting, abdominal pain, diarrhea and constipation.  Genitourinary: Negative for dysuria, frequency and hematuria.  Musculoskeletal: Negative for  back pain, joint pain and neck pain.  Skin:       No acne, rash, or lesions  Neurological: Positive for weakness. Negative for dizziness, tremors and focal weakness.  Endo/Heme/Allergies: Negative for polydipsia. Does not bruise/bleed easily.  Psychiatric/Behavioral: Negative for depression. The patient is not nervous/anxious and does not have insomnia.      VITAL SIGNS:   Filed Vitals:   05/26/15 2048  BP: 132/71  Pulse: 63  Temp: 97.5 F (36.4 C)  TempSrc: Oral  Resp: 20   Height:  (1.778 m)  Weight: 67.586 kg (149 lb)  SpO2: 95%   Wt Readings from Last 3 Encounters:  05/26/15 67.586 kg (149 lb)    PHYSICAL EXAMINATION:  Physical Exam  Vitals reviewed. Constitutional: She is oriented to person, place, and time. She appears well-developed and well-nourished. No distress.  HENT:  Head: Normocephalic and atraumatic.  Mouth/Throat: Oropharynx is clear and moist.  Eyes: Conjunctivae and EOM are normal. Pupils are equal, round, and reactive to light. No scleral icterus.  Neck: Normal range of motion. Neck supple. No JVD present. No thyromegaly present.  Cardiovascular: Normal rate, regular rhythm and intact distal pulses.  Exam reveals no gallop and no friction rub.   Murmur (3/6 systolic murmur) heard. Respiratory: Effort normal and breath sounds normal. No respiratory distress. She has no wheezes. She has no rales.  GI: Soft. Bowel sounds are normal. She exhibits no distension. There is no tenderness.  Musculoskeletal: Normal range of motion. She exhibits no edema.  No arthritis, no gout  Lymphadenopathy:    She has no cervical adenopathy.  Neurological: She is alert and oriented to person, place, and time. No cranial nerve deficit.  No dysarthria, no aphasia  Skin: Skin is warm and dry. No rash noted. No erythema.  Psychiatric: She has a normal mood and affect. Her behavior is normal. Judgment and thought content normal.    LABORATORY PANEL:   CBC  Recent Labs Lab 05/26/15 2101  WBC 12.2*  HGB 11.9*  HCT 37.1  PLT 251   ------------------------------------------------------------------------------------------------------------------  Chemistries   Recent Labs Lab 05/26/15 2101  NA 138  K 3.9  CL 106  CO2 22  GLUCOSE 137*  BUN 22*  CREATININE 1.17*  CALCIUM 9.4  AST 17  ALT 14  ALKPHOS 61  BILITOT 0.7    ------------------------------------------------------------------------------------------------------------------  Cardiac Enzymes No results for input(s): TROPONINI in the last 168 hours. ------------------------------------------------------------------------------------------------------------------  RADIOLOGY:  No results found.  EKG:   Orders placed or performed in visit on 03/29/14  . EKG 12-Lead    IMPRESSION AND PLAN:  Principal Problem:   GI bleed - suspect high possibility of diverticular bleed, as the blood was initially bright red and patient has no abdominal pain or other symptoms. Patient denies any documented history of diverticulosis. Hemoglobin currently stable, will monitor every 6 hours. Blood pressure stable at this time. We'll keep nothing by mouth and get a GI consult. We'll place on Protonix drip. Active Problems:   AKI (acute kidney injury) (HCC) - likely due to hypovolemia from acute blood loss and prerenal insult. We will keep her on IV fluids for hydration and monitor her creatinine level. Avoid nephrotoxins.   HTN (hypertension) - currently stable, holding by mouth meds at this time including antihypertensives as her blood pressure is normotensive. Monitor, and they restart once her blood pressure rises.   Chronic diastolic CHF (congestive heart failure) (HCC) - not currently exacerbated. Will hold her meds at this  time as they can further lower her blood pressure. These will need to be restarted when possible.   Hypothyroid - continue home dose thyroid replacement.  All the records are reviewed and case discussed with ED provider. Management plans discussed with the patient and/or family.  DVT PROPHYLAXIS: Mechanical only  GI PROPHYLAXIS: PPI  ADMISSION STATUS: Inpatient  CODE STATUS: Full Code Status History    This patient does not have a recorded code status. Please follow your organizational policy for patients in this situation.      TOTAL  TIME TAKING CARE OF THIS PATIENT: 45 minutes.    Keian Odriscoll FIELDING 05/26/2015, 10:34 PM  [facility]  Office  [phone]  CC: Primary care physician; Dorothey Baseman, MD

## 2015-05-27 LAB — BASIC METABOLIC PANEL
ANION GAP: 6 (ref 5–15)
BUN: 22 mg/dL — ABNORMAL HIGH (ref 6–20)
CHLORIDE: 111 mmol/L (ref 101–111)
CO2: 23 mmol/L (ref 22–32)
Calcium: 8.3 mg/dL — ABNORMAL LOW (ref 8.9–10.3)
Creatinine, Ser: 1.05 mg/dL — ABNORMAL HIGH (ref 0.44–1.00)
GFR calc non Af Amer: 45 mL/min — ABNORMAL LOW (ref >60)
GFR, EST AFRICAN AMERICAN: 53 mL/min — AB (ref >60)
Glucose, Bld: 122 mg/dL — ABNORMAL HIGH (ref 65–99)
POTASSIUM: 3.8 mmol/L (ref 3.5–5.1)
SODIUM: 140 mmol/L (ref 135–145)

## 2015-05-27 LAB — CBC
HEMATOCRIT: 27.5 % — AB (ref 35.0–47.0)
HEMOGLOBIN: 9 g/dL — AB (ref 12.0–16.0)
MCH: 31.3 pg (ref 26.0–34.0)
MCHC: 32.8 g/dL (ref 32.0–36.0)
MCV: 95.3 fL (ref 80.0–100.0)
PLATELETS: 192 10*3/uL (ref 150–440)
RBC: 2.89 MIL/uL — AB (ref 3.80–5.20)
RDW: 13.1 % (ref 11.5–14.5)
WBC: 10 10*3/uL (ref 3.6–11.0)

## 2015-05-27 LAB — HEMOGLOBIN
Hemoglobin: 8.6 g/dL — ABNORMAL LOW (ref 12.0–16.0)
Hemoglobin: 8.2 g/dL — ABNORMAL LOW (ref 12.0–16.0)

## 2015-05-27 LAB — HEMOGLOBIN AND HEMATOCRIT, BLOOD
HEMATOCRIT: 26.8 % — AB (ref 35.0–47.0)
Hemoglobin: 8.9 g/dL — ABNORMAL LOW (ref 12.0–16.0)

## 2015-05-27 MED ORDER — SODIUM CHLORIDE 0.9 % IV SOLN
8.0000 mg/h | INTRAVENOUS | Status: DC
  Administered 2015-05-27 – 2015-05-28 (×4): 8 mg/h via INTRAVENOUS
  Filled 2015-05-27 (×4): qty 80

## 2015-05-27 MED ORDER — LEVOTHYROXINE SODIUM 75 MCG PO TABS
75.0000 ug | ORAL_TABLET | Freq: Every day | ORAL | Status: DC
  Administered 2015-05-27 – 2015-05-29 (×3): 75 ug via ORAL
  Filled 2015-05-27 (×3): qty 1

## 2015-05-27 MED ORDER — PANTOPRAZOLE SODIUM 40 MG IV SOLR
80.0000 mg | Freq: Once | INTRAVENOUS | Status: AC
  Administered 2015-05-27: 80 mg via INTRAVENOUS
  Filled 2015-05-27: qty 80

## 2015-05-27 MED ORDER — SODIUM CHLORIDE 0.9 % IJ SOLN
3.0000 mL | Freq: Two times a day (BID) | INTRAMUSCULAR | Status: DC
  Administered 2015-05-27 – 2015-05-29 (×5): 3 mL via INTRAVENOUS

## 2015-05-27 MED ORDER — PANTOPRAZOLE SODIUM 40 MG IV SOLR: 40.0000 mg | Freq: Two times a day (BID) | INTRAVENOUS | Status: DC

## 2015-05-27 MED ORDER — ACETAMINOPHEN 10 MG/ML IV SOLN
500.0000 mg | Freq: Four times a day (QID) | INTRAVENOUS | Status: AC
  Administered 2015-05-27 (×4): 500 mg via INTRAVENOUS
  Filled 2015-05-27 (×5): qty 50

## 2015-05-27 MED ORDER — ONDANSETRON HCL 4 MG PO TABS: 4.0000 mg | ORAL_TABLET | Freq: Four times a day (QID) | ORAL | Status: DC | PRN

## 2015-05-27 MED ORDER — ACETAMINOPHEN 650 MG RE SUPP: 650.0000 mg | Freq: Four times a day (QID) | RECTAL | Status: DC | PRN

## 2015-05-27 MED ORDER — SODIUM CHLORIDE 0.9 % IV SOLN
INTRAVENOUS | Status: AC
  Administered 2015-05-27: 03:00:00 via INTRAVENOUS

## 2015-05-27 MED ORDER — ONDANSETRON HCL 4 MG/2ML IJ SOLN: 4.0000 mg | Freq: Four times a day (QID) | INTRAMUSCULAR | Status: DC | PRN

## 2015-05-27 MED ORDER — ACETAMINOPHEN 325 MG PO TABS
650.0000 mg | ORAL_TABLET | Freq: Four times a day (QID) | ORAL | Status: DC | PRN
  Administered 2015-05-29: 10:00:00 650 mg via ORAL
  Filled 2015-05-27: qty 2

## 2015-05-27 MED ORDER — SODIUM CHLORIDE 0.9 % IV SOLN
Freq: Once | INTRAVENOUS | Status: AC
  Administered 2015-05-27: 04:00:00 via INTRAVENOUS

## 2015-05-27 MED ORDER — DIPHENHYDRAMINE HCL 50 MG/ML IJ SOLN
25.0000 mg | Freq: Once | INTRAMUSCULAR | Status: AC
  Administered 2015-05-27: 25 mg via INTRAVENOUS
  Filled 2015-05-27: qty 0.5

## 2015-05-27 NOTE — Consult Note (Signed)
GI Inpatient Consult Note  Reason for Consult: BRBPR   Attending Requesting Consult:  History of Present Illness: Vanessa Lang is a  pleasant 82 y.o. female who started passing BRBPR yesterday. Minimal abdominal pain. Recalls having normal colonoscopy 8 years ago. No mention of diverticulosis. On bASA daily. Had intestinal surgery 1 year ago for bowel obstruction. Few small bloody BM's this AM>  Past Medical History:  Past Medical History  Diagnosis Date  . HTN (hypertension)   . Hypothyroidism   . CHF (congestive heart failure) (HCC)   . Depression   . Gout   . HLD (hyperlipidemia)     Problem List: Patient Active Problem List   Diagnosis Date Noted  . GI bleed 05/26/2015  . HTN (hypertension) 05/26/2015  . Hypothyroid 05/26/2015  . Chronic diastolic CHF (congestive heart failure) (HCC) 05/26/2015  . AKI (acute kidney injury) (HCC) 05/26/2015    Past Surgical History: Past Surgical History  Procedure Laterality Date  . Abdominal hysterectomy    . Pacemaker insertion    . Thyroidectomy      Allergies: Allergies  Allergen Reactions  . Propranolol     Home Medications: Prescriptions prior to admission  Medication Sig Dispense Refill Last Dose  . amLODipine (NORVASC) 5 MG tablet Take 1 tablet by mouth daily.   05/26/2015 at AM  . atenolol (TENORMIN) 50 MG tablet Take 1 tablet by mouth daily.   05/26/2015 at AM  . gabapentin (NEURONTIN) 100 MG capsule Take 1 capsule by mouth 3 (three) times daily.   05/26/2015 at AM  . HYDROcodone-acetaminophen (NORCO/VICODIN) 5-325 MG tablet Take 1 tablet by mouth every 6 (six) hours as needed.    PRN at PRN  . isosorbide mononitrate (IMDUR) 30 MG 24 hr tablet Take 1 tablet by mouth daily.   05/26/2015 at AM  . lisinopril (PRINIVIL,ZESTRIL) 40 MG tablet Take 1 tablet by mouth daily.   05/26/2015 at AM  . SYNTHROID 75 MCG tablet Take 1 tablet by mouth daily.   05/26/2015 at AM   Home medication reconciliation was completed with the  patient.   Scheduled Inpatient Medications:   . acetaminophen  500 mg Intravenous 4 times per day  . levothyroxine  75 mcg Oral QAC breakfast  . [START ON 05/30/2015] pantoprazole (PROTONIX) IV  40 mg Intravenous Q12H  . sodium chloride  3 mL Intravenous Q12H    Continuous Inpatient Infusions:   . pantoprozole (PROTONIX) infusion 8 mg/hr (05/27/15 0246)    PRN Inpatient Medications:  acetaminophen **OR** acetaminophen, ondansetron **OR** ondansetron (ZOFRAN) IV  Family History: family history includes Cancer in her brother; Colon cancer in her sister; Heart attack in her brother; Hypertension in her sister; Thyroid disease in her mother and sister.  The patient's family history is negative for inflammatory bowel disorders, GI malignancy, or solid organ transplantation.  Social History:   reports that she has quit smoking. She does not have any smokeless tobacco history on file. She reports that she does not drink alcohol or use illicit drugs. The patient denies ETOH, tobacco, or drug use.   Review of Systems: Constitutional: Weight is stable.  Eyes: No changes in vision. ENT: No oral lesions, sore throat.  GI: see HPI.  Heme/Lymph: No easy bruising.  CV: No chest pain.  GU: No hematuria.  Integumentary: No rashes.  Neuro: No headaches.  Psych: No depression/anxiety.  Endocrine: No heat/cold intolerance.  Allergic/Immunologic: No urticaria.  Resp: No cough, SOB.  Musculoskeletal: No joint swelling.  Physical Examination: BP 96/64 mmHg  Pulse 63  Temp(Src) 98.6 F (37 C) (Oral)  Resp 16  Ht  (1.778 m)  Wt 66.271 kg (146 lb 1.6 oz)  BMI 20.96 kg/m2  SpO2 99% Gen: NAD, alert and oriented x 4 HEENT: PEERLA, EOMI, Neck: supple, no JVD or thyromegaly Chest: CTA bilaterally, no wheezes, crackles, or other adventitious sounds CV: RRR, no m/g/c/r Abd: soft, NT, ND, +BS in all four quadrants; no HSM, guarding, ridigity, or rebound tenderness Ext: no edema, well  perfused with 2+ pulses, Skin: no rash or lesions noted Lymph: no LAD  Data: Lab Results  Component Value Date   WBC 10.0 05/27/2015   HGB 8.9* 05/27/2015   HCT 26.8* 05/27/2015   MCV 95.3 05/27/2015   PLT 192 05/27/2015    Recent Labs Lab 05/26/15 2101 05/27/15 0158 05/27/15 0915  HGB 11.9* 9.0* 8.9*   Lab Results  Component Value Date   NA 140 05/27/2015   K 3.8 05/27/2015   CL 111 05/27/2015   CO2 23 05/27/2015   BUN 22* 05/27/2015   CREATININE 1.05* 05/27/2015   Lab Results  Component Value Date   ALT 14 05/26/2015   AST 17 05/26/2015   ALKPHOS 61 05/26/2015   BILITOT 0.7 05/26/2015   No results for input(s): APTT, INR, PTT in the last 168 hours. Assessment/Plan: Vanessa Lang is a 82 y.o. female with LGI bleeding, likely from diverticulosis. Bleeding from AVM less possible.  Recommendations: Bleeding scan IF active bleeding today. Hold ASA. Expect bleeding to stop on own. Consider colonoscopy only if bleeding persists. Will follow.  Thank you for the consult. Please call with questions or concerns.  Vanessa Lang, Ezzard Standing, MD

## 2015-05-27 NOTE — ED Notes (Signed)
Transporting to 2a

## 2015-05-27 NOTE — Progress Notes (Signed)
[facility] - [place] at [facility]   PATIENT NAME: Vanessa Lang    MR#:  [id number]  DATE OF BIRTH:  November 09, 1922  SUBJECTIVE:  CHIEF COMPLAINT:   Chief Complaint  Patient presents with  . Rectal Bleeding    Came with bright red blood in stool, and then had few dark clots.   Overnight had 2-3 BM with dark blood.   S/p 1 unit of blood transfusion. No abd pain, nausea, vomit.  REVIEW OF SYSTEMS:  CONSTITUTIONAL: No fever, fatigue or weakness.  EYES: No blurred or double vision.  EARS, NOSE, AND THROAT: No tinnitus or ear pain.  RESPIRATORY: No cough, shortness of breath, wheezing or hemoptysis.  CARDIOVASCULAR: No chest pain, orthopnea, edema.  GASTROINTESTINAL: No nausea, vomiting, diarrhea or abdominal pain. Had dark stool. GENITOURINARY: No dysuria, hematuria.  ENDOCRINE: No polyuria, nocturia,  HEMATOLOGY: No anemia, easy bruising or bleeding SKIN: No rash or lesion. MUSCULOSKELETAL: No joint pain or arthritis.   NEUROLOGIC: No tingling, numbness, weakness.  PSYCHIATRY: No anxiety or depression.   ROS  DRUG ALLERGIES:   Allergies  Allergen Reactions  . Propranolol     VITALS:  Blood pressure 155/60, pulse 64, temperature 98 F (36.7 C), temperature source Oral, resp. rate 18, height  (1.778 m), weight 66.271 kg (146 lb 1.6 oz), SpO2 99 %.  PHYSICAL EXAMINATION:  GENERAL:  82 y.o.-year-old patient lying in the bed with no acute distress.  EYES: Pupils equal, round, reactive to light and accommodation. No scleral icterus. Extraocular muscles intact. Conjunctiva pale. HEENT: Head atraumatic, normocephalic. Oropharynx and nasopharynx clear.  NECK:  Supple, no jugular venous distention. No thyroid enlargement, no tenderness.  LUNGS: Normal breath sounds bilaterally, no wheezing, rales,rhonchi or crepitation. No use of accessory muscles of respiration.  CARDIOVASCULAR: S1, S2 normal. No murmurs, rubs, or gallops.  ABDOMEN: Soft, nontender,  nondistended. Bowel sounds present. No organomegaly or mass.  EXTREMITIES: No pedal edema, cyanosis, or clubbing.  NEUROLOGIC: Cranial nerves II through XII are intact. Muscle strength 5/5 in all extremities. Sensation intact. Gait not checked.  PSYCHIATRIC: The patient is alert and oriented x 3.  SKIN: No obvious rash, lesion, or ulcer.   Physical Exam LABORATORY PANEL:   CBC  Recent Labs Lab 05/27/15 0158 05/27/15 0915  05/27/15 1851  WBC 10.0  --   --   --   HGB 9.0* 8.9*  < > 8.2*  HCT 27.5* 26.8*  --   --   PLT 192  --   --   --   < > = values in this interval not displayed. ------------------------------------------------------------------------------------------------------------------  Chemistries   Recent Labs Lab 05/26/15 2101 05/27/15 0158  NA 138 140  K 3.9 3.8  CL 106 111  CO2 22 23  GLUCOSE 137* 122*  BUN 22* 22*  CREATININE 1.17* 1.05*  CALCIUM 9.4 8.3*  AST 17  --   ALT 14  --   ALKPHOS 61  --   BILITOT 0.7  --    ------------------------------------------------------------------------------------------------------------------  Cardiac Enzymes No results for input(s): TROPONINI in the last 168 hours. ------------------------------------------------------------------------------------------------------------------  RADIOLOGY:  No results found.  ASSESSMENT AND PLAN:   Principal Problem:   GI bleed Active Problems:   HTN (hypertension)   Hypothyroid   Chronic diastolic CHF (congestive heart failure) (HCC)   AKI (acute kidney injury) (HCC)   * GI bleed - suspect high possibility of diverticular bleed,   no abdominal pain or other symptoms. Patient denies any documented history  of diverticulosis. Hemoglobin currently stable, will monitor every 6 hours. Blood pressure stable at this time.   keep nothing by mouth ,appreciated GI consult.  on Protonix BID.  * acute blood loss anemia   S/p transfusion today.  * AKI (acute kidney injury)  (HCC) - likely due to hypovolemia from acute blood loss and prerenal insult.   on IV fluids for hydration and monitor her creatinine level. Avoid nephrotoxins.  * HTN (hypertension) - currently stable, holding by mouth meds at this time including antihypertensives as her blood pressure is normotensive. Monitor, and they restart once her blood pressure rises.  * Chronic diastolic CHF (congestive heart failure) (HCC) - not currently exacerbated. Will hold her meds at this time as they can further lower her blood pressure. These will need to be restarted when possible.  * Hypothyroid - continue home dose thyroid replacement.   All the records are reviewed and case discussed with Care Management/Social Workerr. Management plans discussed with the patient, family and they are in agreement.  CODE STATUS: Full  TOTAL TIME TAKING CARE OF THIS PATIENT: 35 minutes.     POSSIBLE D/C IN 1-2 DAYS, DEPENDING ON CLINICAL CONDITION.   [name] M.D on 05/27/2015   Between 7am to 6pm - Pager - [phone]  After 6pm go to [email or web address] - password [email or web address]  [facility]  Office  [phone]  CC: Primary care physician; Dorothey Baseman, MD  Note: This dictation was prepared with Dragon dictation along with smaller phrase technology. Any transcriptional errors that result from this process are unintentional.

## 2015-05-27 NOTE — Progress Notes (Addendum)
Pt admitted for rectal bleeding, A&O, 1 Assist to [facility]. No c/o pain. Skin assessment- skin is dry and flaky, there is a plantar callus on right foot. Skin witnessed by Soumoun, RN.   At 0214 pt had an episode of heavy bleeding in bed. Pt requested to use the bathroom while this writer was getting her clean. A bedpan was offered, pt refused, pt stated "I was using the bathroom in the emergency room, I have pride, I wont do it in bed" got pt to [facility], while on the [facility] pt said "I'm bleeding to death, I feel it coming." after couple of minutes pt's face started getting pale and pt fainted for about 1-2 minutes. RRT was called.  Pt was helped back in bed. when RR nurse and supervisor got to the room patient was coming back to baseline, A&O/talking. Hgb level went from 11.9 to 9.0, MD Willis aware. One unit of blood is infusing at this time as ordered by Dr. Anne Hahn.

## 2015-05-27 NOTE — Progress Notes (Signed)
Rapid Response Event Note Called to rapid response to room 252.      Overview:    Per nursing staff patient got up to bedside commode for bowel movement and "went out".  Patient currently resting in bed.     Initial Focused Assessment:  Alert and oriented.  B/P 108-70.  HR 68.  Hgb 9.0.      Interventions:  IVF bolus currently given. Type and screen previously ordered.  Protonix drip ordered and GI consult pending Event Summary:   at  Dr. Anne Hahn notified.  Continue to monitor VS and for continued bleeding.  Patient instructed not to get OOB.    at          [facility]

## 2015-05-28 LAB — CBC
HEMATOCRIT: 26.6 % — AB (ref 35.0–47.0)
Hemoglobin: 8.9 g/dL — ABNORMAL LOW (ref 12.0–16.0)
MCH: 30.7 pg (ref 26.0–34.0)
MCHC: 33.6 g/dL (ref 32.0–36.0)
MCV: 91.5 fL (ref 80.0–100.0)
Platelets: 165 10*3/uL (ref 150–440)
RBC: 2.91 MIL/uL — ABNORMAL LOW (ref 3.80–5.20)
RDW: 14.5 % (ref 11.5–14.5)
WBC: 8.4 10*3/uL (ref 3.6–11.0)

## 2015-05-28 LAB — HEMOGLOBIN: HEMOGLOBIN: 7.9 g/dL — AB (ref 12.0–16.0)

## 2015-05-28 MED ORDER — LISINOPRIL 20 MG PO TABS
40.0000 mg | ORAL_TABLET | Freq: Every day | ORAL | Status: DC
  Administered 2015-05-28 – 2015-05-29 (×2): 40 mg via ORAL
  Filled 2015-05-28 (×2): qty 2

## 2015-05-28 MED ORDER — LISINOPRIL 20 MG PO TABS: 40.0000 mg | ORAL_TABLET | Freq: Every evening | ORAL | Status: DC

## 2015-05-28 MED ORDER — ATENOLOL 50 MG PO TABS
50.0000 mg | ORAL_TABLET | Freq: Every day | ORAL | Status: DC
  Administered 2015-05-28 – 2015-05-29 (×2): 50 mg via ORAL
  Filled 2015-05-28 (×3): qty 2
  Filled 2015-05-28: qty 1

## 2015-05-28 MED ORDER — ISOSORBIDE MONONITRATE ER 30 MG PO TB24
30.0000 mg | ORAL_TABLET | Freq: Every day | ORAL | Status: DC
  Administered 2015-05-28 – 2015-05-29 (×2): 30 mg via ORAL
  Filled 2015-05-28 (×3): qty 1

## 2015-05-28 MED ORDER — AMLODIPINE BESYLATE 5 MG PO TABS
5.0000 mg | ORAL_TABLET | Freq: Every day | ORAL | Status: DC
  Administered 2015-05-28 – 2015-05-29 (×2): 5 mg via ORAL
  Filled 2015-05-28 (×2): qty 1

## 2015-05-28 MED ORDER — ALPRAZOLAM 0.25 MG PO TABS
0.2500 mg | ORAL_TABLET | Freq: Every day | ORAL | Status: DC | PRN
  Administered 2015-05-28: 0.25 mg via ORAL
  Filled 2015-05-28: qty 1

## 2015-05-28 NOTE — Plan of Care (Signed)
Pt improving - no bloody stools; Hgb imp to 8.9.  Still experiencing dizziness.  Wked w/PT but had run of Afib.  Pt has been off her BP meds and they were restarted today.  PT recommends home w/home PT and also recommends that someone stay w/her 24/7.  We really need to make sure this happens.  Protonix drip d/ced and her diet has been advanced to soft and she seems to be tolerating.  Pt states she has some anxiety and takes xanax prn daily.  That has been reordered.  Possible d/c tomorrow.

## 2015-05-28 NOTE — Progress Notes (Signed)
[facility] -  at [facility]   PATIENT NAME: Vanessa Lang    MR#:  [id number]  DATE OF BIRTH:  1923/03/14  SUBJECTIVE:  CHIEF COMPLAINT:   Chief Complaint  Patient presents with  . Rectal Bleeding    Came with bright red blood in stool, and then had few dark clots.   Overnight had 2-3 BM with dark blood.   S/p 1 unit of blood transfusion. No abd pain, nausea, vomit.  did not had any BM in last 24 hrs.   Tolerated liquid diet well.  REVIEW OF SYSTEMS:  CONSTITUTIONAL: No fever, fatigue or weakness.  EYES: No blurred or double vision.  EARS, NOSE, AND THROAT: No tinnitus or ear pain.  RESPIRATORY: No cough, shortness of breath, wheezing or hemoptysis.  CARDIOVASCULAR: No chest pain, orthopnea, edema.  GASTROINTESTINAL: No nausea, vomiting, diarrhea or abdominal pain. Had dark stool. GENITOURINARY: No dysuria, hematuria.  ENDOCRINE: No polyuria, nocturia,  HEMATOLOGY: No anemia, easy bruising or bleeding SKIN: No rash or lesion. MUSCULOSKELETAL: No joint pain or arthritis.   NEUROLOGIC: No tingling, numbness, weakness.  PSYCHIATRY: No anxiety or depression.   ROS  DRUG ALLERGIES:   Allergies  Allergen Reactions  . Propranolol     VITALS:  Blood pressure 166/69, pulse 63, temperature 98.2 F (36.8 C), temperature source Oral, resp. rate 16, height  (1.778 m), weight 66.271 kg (146 lb 1.6 oz), SpO2 100 %.  PHYSICAL EXAMINATION:  GENERAL:  82 y.o.-year-old patient lying in the bed with no acute distress.  EYES: Pupils equal, round, reactive to light and accommodation. No scleral icterus. Extraocular muscles intact. Conjunctiva pale. HEENT: Head atraumatic, normocephalic. Oropharynx and nasopharynx clear.  NECK:  Supple, no jugular venous distention. No thyroid enlargement, no tenderness.  LUNGS: Normal breath sounds bilaterally, no wheezing, rales,rhonchi or crepitation. No use of accessory muscles of respiration.  CARDIOVASCULAR: S1,  S2 normal. No murmurs, rubs, or gallops.  ABDOMEN: Soft, nontender, nondistended. Bowel sounds present. No organomegaly or mass.  EXTREMITIES: No pedal edema, cyanosis, or clubbing.  NEUROLOGIC: Cranial nerves II through XII are intact. Muscle strength 5/5 in all extremities. Sensation intact. Gait not checked.  PSYCHIATRIC: The patient is alert and oriented x 3.  SKIN: No obvious rash, lesion, or ulcer.   Physical Exam LABORATORY PANEL:   CBC  Recent Labs Lab 05/28/15 0721  WBC 8.4  HGB 8.9*  HCT 26.6*  PLT 165   ------------------------------------------------------------------------------------------------------------------  Chemistries   Recent Labs Lab 05/26/15 2101 05/27/15 0158  NA 138 140  K 3.9 3.8  CL 106 111  CO2 22 23  GLUCOSE 137* 122*  BUN 22* 22*  CREATININE 1.17* 1.05*  CALCIUM 9.4 8.3*  AST 17  --   ALT 14  --   ALKPHOS 61  --   BILITOT 0.7  --    ------------------------------------------------------------------------------------------------------------------  Cardiac Enzymes No results for input(s): TROPONINI in the last 168 hours. ------------------------------------------------------------------------------------------------------------------  RADIOLOGY:  No results found.  ASSESSMENT AND PLAN:   Principal Problem:   GI bleed Active Problems:   HTN (hypertension)   Hypothyroid   Chronic diastolic CHF (congestive heart failure) (HCC)   AKI (acute kidney injury) (HCC)   * GI bleed - suspect high possibility of diverticular bleed,   no abdominal pain or other symptoms. Patient denies any documented history of diverticulosis. Hemoglobin  currently stable, Blood pressure stable at this time.  appreciated GI consult.  on Protonix BID.  Upgrade diet as she can tolerate.  *  acute blood loss anemia   S/p transfusion, Hb stable.  * AKI (acute kidney injury) (HCC) - likely due to hypovolemia from acute blood loss and prerenal insult.    on IV fluids for hydration and monitor her creatinine level. Avoid nephrotoxins.   Renal func at baseline now.  * HTN (hypertension) - currently stable, holding by mouth meds at this time including antihypertensives as her blood pressure is normotensive. Monitor  BP started rising- resume meds today.  * Chronic diastolic CHF (congestive heart failure) (HCC) - not currently exacerbated.   resume meds now.  * Hypothyroid - continue home dose thyroid replacement.   All the records are reviewed and case discussed with Care Management/Social Workerr. Management plans discussed with the patient, family and they are in agreement.  CODE STATUS: Full  TOTAL TIME TAKING CARE OF THIS PATIENT: 35 minutes.  As pt is weak, she walks with a cane at home- will get PT eval to get discharge planning. BP is on higher side, so resumed home meds today.  Likely d/c tomorrow.   [name] M.D on 05/28/2015   Between 7am to 6pm - Pager - [phone]  After 6pm go to [email or web address] - password [email or web address] [facility]  [facility]  Office  [phone]  CC: Primary care physician; Dorothey Baseman, MD  Note: This dictation was prepared with Dragon dictation along with smaller phrase technology. Any transcriptional errors that result from this process are unintentional.

## 2015-05-28 NOTE — Consult Note (Signed)
  GI Inpatient Follow-up Note  Patient Identification: Vanessa Lang is a 82 y.o. female  Subjective: No further bleeding. Hgb stable. Eating solids now.  Scheduled Inpatient Medications:  . amLODipine  5 mg Oral Daily  . atenolol  50 mg Oral Daily  . isosorbide mononitrate  30 mg Oral Daily  . levothyroxine  75 mcg Oral QAC breakfast  . lisinopril  40 mg Oral Daily  . [START ON 05/30/2015] pantoprazole (PROTONIX) IV  40 mg Intravenous Q12H  . sodium chloride  3 mL Intravenous Q12H    Continuous Inpatient Infusions:   . pantoprozole (PROTONIX) infusion 8 mg/hr (05/28/15 1355)    PRN Inpatient Medications:  acetaminophen **OR** acetaminophen, ondansetron **OR** ondansetron (ZOFRAN) IV  Review of Systems: Constitutional: Weight is stable.  Eyes: No changes in vision. ENT: No oral lesions, sore throat.  GI: see HPI.  Heme/Lymph: No easy bruising.  CV: No chest pain.  GU: No hematuria.  Integumentary: No rashes.  Neuro: No headaches.  Psych: No depression/anxiety.  Endocrine: No heat/cold intolerance.  Allergic/Immunologic: No urticaria.  Resp: No cough, SOB.  Musculoskeletal: No joint swelling.    Physical Examination: BP 166/69 mmHg  Pulse 63  Temp(Src) 98.2 F (36.8 C) (Oral)  Resp 16  Ht  (1.778 m)  Wt 66.271 kg (146 lb 1.6 oz)  BMI 20.96 kg/m2  SpO2 100% Gen: NAD, alert and oriented x 4 HEENT: PEERLA, EOMI, Neck: supple, no JVD or thyromegaly Chest: CTA bilaterally, no wheezes, crackles, or other adventitious sounds CV: RRR, no m/g/c/r Abd: soft, NT, ND, +BS in all four quadrants; no HSM, guarding, ridigity, or rebound tenderness Ext: no edema, well perfused with 2+ pulses, Skin: no rash or lesions noted Lymph: no LAD  Data: Lab Results  Component Value Date   WBC 8.4 05/28/2015   HGB 8.9* 05/28/2015   HCT 26.6* 05/28/2015   MCV 91.5 05/28/2015   PLT 165 05/28/2015    Recent Labs Lab 05/27/15 1851 05/28/15 0234 05/28/15 0721  HGB  8.2* 7.9* 8.9*   Lab Results  Component Value Date   NA 140 05/27/2015   K 3.8 05/27/2015   CL 111 05/27/2015   CO2 23 05/27/2015   BUN 22* 05/27/2015   CREATININE 1.05* 05/27/2015   Lab Results  Component Value Date   ALT 14 05/26/2015   AST 17 05/26/2015   ALKPHOS 61 05/26/2015   BILITOT 0.7 05/26/2015   No results for input(s): APTT, INR, PTT in the last 168 hours. Assessment/Plan: Ms. Panella is a 82 y.o. female with LGI bleeiding, which has stopped.  Recommendations: Discharge pt if remains stable. No plans for colonoscopy unless pt actively rebleeds. Can f/u in GI in few weeks. Will sign off. Thanks Please call with questions or concerns.  Corde Antonini, Ezzard Standing, MD

## 2015-05-28 NOTE — Evaluation (Signed)
Physical Therapy Evaluation Patient Details Name: Vanessa Lang MRN: [id number] DOB: [birth year] Today's Date: 05/28/2015   History of Present Illness  Vanessa Lang is a 82 y.o. female who presents with GI bleed. Patient states that early this afternoon she had a bowel movement and noticed a fair amount of bright red blood. She then proceeded to have 4-5 more bowel movements at home. His bowel movements then turned darker with clots visible. She began to feel a little weak and came to the ED for evaluation. She had 2-3 more bloody bowel movements here. Lab workup is largely stable at this time. Vital signs are stable, including hemodynamics. Patient denies any abdominal pain or other symptoms. Denies any prior history of GI bleed. Hospitalists were called for admission. No falls in the last 12 months  Clinical Impression  Pt demonstrates good strength and stability with bed mobility and transfers. She demonstrate safe use of rolling walker but requires cues initially to stay within confines of walker. During ambulation pt reports increase in dizziness requiring return to bed. HR found to be 160 and RN notified who comes and confirms. RN to contact MD regarding heart medications. Pt is safe to return home provided she has intermittent supervision for the first 24-48 hours due to dizziness with ambulation. Advised to utilize rolling walker upon returning home which she agrees. Pt will need HH PT for strengthening and balance training upon returning home. Pt will benefit from skilled PT services to address deficits in strength, balance, and mobility in order to return to full function at home.     Follow Up Recommendations Home health PT;Supervision - Intermittent    Equipment Recommendations  None recommended by PT (Should use rolling walker at discharge, pt agrees)    Recommendations for Other Services       Precautions / Restrictions Precautions Precautions: Fall Restrictions Weight Bearing  Restrictions: No      Mobility  Bed Mobility Overal bed mobility: Independent             General bed mobility comments: Good strength and sequencing noted with bed mobility  Transfers Overall transfer level: Needs assistance Equipment used: Rolling walker (2 wheeled) Transfers: Sit to/from Stand Sit to Stand: Min guard         General transfer comment: Pt demonstrates increased time to come to standing but overall is steady and stable with transfer. Pt with good safety awareness and no evidence for LOB. Forward leaning trunk posture noted in standing but appropriate for age  Ambulation/Gait Ambulation/Gait assistance: Min guard Ambulation Distance (Feet): 40 Feet Assistive device: Rolling walker (2 wheeled) Gait Pattern/deviations: Decreased step length - right;Decreased step length - left Gait velocity: Decreased but pt reports speed is baseline for her Gait velocity interpretation: <1.8 ft/sec, indicative of risk for recurrent falls General Gait Details: Overall decreased gait speed. Pt is steady with use of walker but reports dizziness in standing and during ambulation. Pt requires cues to standing inside walker as she typically uses single point cane for ambulation. Pt unable to ambulate further at this time due to dizziness. Upon returning to bed HR found to be 160 bpm, SaO2>90%. RN notified who comes and takes apical pulse who also notes tachycardia. Per RN pt has not recieved all of her heart medications. RN to call MD.   Stairs            Wheelchair Mobility    Modified Rankin (Stroke Patients Only)       Balance Overall  balance assessment: Needs assistance Sitting-balance support: No upper extremity supported Sitting balance-Leahy Scale: Good     Standing balance support: No upper extremity supported Standing balance-Leahy Scale: Fair                               Pertinent Vitals/Pain Pain Assessment: 0-10 Pain Score: 4  Pain  Location: L anterior lower ribs Pain Descriptors / Indicators: Sharp Pain Intervention(s): Limited activity within patient's tolerance;Monitored during session    Home Living Family/patient expects to be discharged to:: Private residence Living Arrangements: Alone Available Help at Discharge: Family;Friend(s) Type of Home: House Home Access: Stairs to enter Entrance Stairs-Rails: Right Entrance Stairs-Number of Steps: 2 Home Layout: One level Home Equipment: Toilet riser;Shower seat;Walker - 2 wheels;Cane - single point;Grab bars - toilet;Grab bars - tub/shower;Wheelchair - manual      Prior Function Level of Independence: Needs assistance   Gait / Transfers Assistance Needed: Ambulates with single point cane for household and limited community ambulation. Pt does not drive  ADL's / Homemaking Assistance Needed: Independent with ADLs  Comments: Assist for cleaning house.      Hand Dominance   Dominant Hand: Right    Extremity/Trunk Assessment   Upper Extremity Assessment: Overall WFL for tasks assessed           Lower Extremity Assessment: RLE deficits/detail;Overall [facility] for tasks assessed RLE Deficits / Details: 4-/5 R hip flexion, 4/5 L hip flexion. Otherwise bilateral knee and ankle strength at least 4/5 throughout. Pt denies numbness/tingling in bilateral LE       Communication   Communication: No difficulties  Cognition Arousal/Alertness: Awake/alert Behavior During Therapy: WFL for tasks assessed/performed Overall Cognitive Status: Within Functional Limits for tasks assessed                      General Comments      Exercises        Assessment/Plan    PT Assessment Patient needs continued PT services  PT Diagnosis Difficulty walking;Abnormality of gait;Generalized weakness   PT Problem List Decreased strength;Decreased activity tolerance;Decreased balance;Decreased mobility;Decreased knowledge of use of DME;Cardiopulmonary status limiting  activity  PT Treatment Interventions DME instruction;Gait training;Stair training;Therapeutic activities;Therapeutic exercise;Balance training;Neuromuscular re-education;Patient/family education   PT Goals (Current goals can be found in the Care Plan section) Acute Rehab PT Goals Patient Stated Goal: "I really want to get back home." PT Goal Formulation: With patient Time For Goal Achievement: 06/11/15 Potential to Achieve Goals: Good    Frequency Min 2X/week   Barriers to discharge Decreased caregiver support Pt reports she will contact friend to see if she can have some additional supervision upon returning home    Co-evaluation               End of Session Equipment Utilized During Treatment: Gait belt Activity Tolerance: Other (comment) (Limited by dizziness during ambulation) Patient left: in bed;with call bell/phone within reach;with bed alarm set Nurse Communication: Mobility status;Other (comment) (Elevated HR, dizziness)         Time: 1610-9604 PT Time Calculation (min) (ACUTE ONLY): 32 min   Charges:   PT Evaluation $PT Eval Moderate Complexity: 1 Procedure PT Treatments $Gait Training: 8-22 mins   PT G Codes:       Sharalyn Ink Ayisha Pol PT, DPT   Mallissa Lorenzen 05/28/2015, 3:29 PM

## 2015-05-28 NOTE — Plan of Care (Signed)
Problem: Bowel/Gastric: Goal: Will not experience complications related to bowel motility Outcome: Progressing Pt alert and oriented. No c/o pain nor distress noted. No bleeding noted this shift. Continues on iv Protonix drip at 25 ml/hr. Continue to monitor.

## 2015-05-29 LAB — TYPE AND SCREEN
ABO/RH(D): B POS
Antibody Screen: NEGATIVE
UNIT DIVISION: 0
Unit division: 0

## 2015-05-29 LAB — PREPARE RBC (CROSSMATCH)

## 2015-05-29 MED ORDER — FERROUS SULFATE 325 (65 FE) MG PO TABS: 325.0000 mg | ORAL_TABLET | Freq: Two times a day (BID) | ORAL | Status: AC

## 2015-05-29 MED ORDER — PANTOPRAZOLE SODIUM 40 MG PO TBEC: 40.0000 mg | DELAYED_RELEASE_TABLET | Freq: Every day | ORAL | Status: DC

## 2015-05-29 MED ORDER — HYDROCODONE-ACETAMINOPHEN 5-325 MG PO TABS: 1.0000 | ORAL_TABLET | Freq: Four times a day (QID) | ORAL | Status: DC | PRN

## 2015-05-29 MED ORDER — CYCLOBENZAPRINE HCL 5 MG PO TABS: 5.0000 mg | ORAL_TABLET | Freq: Three times a day (TID) | ORAL | Status: DC | PRN

## 2015-05-29 NOTE — Plan of Care (Signed)
Problem: Bowel/Gastric: Goal: Will not experience complications related to bowel motility Outcome: Progressing Pt up to bsc with standby assist. No complaints of pain. Pt wishes to go home but rehab recommended. No signs of active bleeding. Will continue to monitor.

## 2015-05-29 NOTE — Discharge Summary (Signed)
[facility] - [place] at [facility]   PATIENT NAME: Vanessa Lang    MR#:  [id number]  DATE OF BIRTH:  1922/09/19  DATE OF ADMISSION:  05/26/2015 ADMITTING PHYSICIAN: Oralia Manis, MD  DATE OF DISCHARGE:05/29/2015  PRIMARY CARE PHYSICIAN: Dorothey Baseman, MD    ADMISSION DIAGNOSIS:  Lower GI bleed [K92.2]  DISCHARGE DIAGNOSIS:  Principal Problem:   GI bleed Active Problems:   HTN (hypertension)   Hypothyroid   Chronic diastolic CHF (congestive heart failure) (HCC)   AKI (acute kidney injury) (HCC)   SECONDARY DIAGNOSIS:   Past Medical History  Diagnosis Date  . HTN (hypertension)   . Hypothyroidism   . CHF (congestive heart failure) (HCC)   . Depression   . Gout   . HLD (hyperlipidemia)     HOSPITAL COURSE:   * GI bleed - suspect high possibility of diverticular bleed,  no abdominal pain or other symptoms. Patient denies any documented history of diverticulosis. Hemoglobin currently stable, Blood pressure stable at this time.  appreciated GI consult. on Protonix BID. Upgrade diet as she can tolerate.   Gi suggested conservative approach.  * acute blood loss anemia  S/p transfusion, Hb stable.   Will give oral iron on d/c.  * AKI (acute kidney injury) (HCC) - likely due to hypovolemia from acute blood loss and prerenal insult.  on IV fluids for hydration and monitor her creatinine level. Avoid nephrotoxins.  Renal func at baseline now.  * HTN (hypertension) - currently stable, holding by mouth meds at this time including antihypertensives as her blood pressure is normotensive. Monitor BP started rising- resume meds today.  * Chronic diastolic CHF (congestive heart failure) (HCC) - not currently exacerbated.  resume meds now.  * Hypothyroid - continue home dose thyroid replacement.  * left lower chest pain   More positional, and sharp.   Likely due to muscular strain.   Flexeryl, and continue norco as she was  taking.  DISCHARGE CONDITIONS:   Stable.  CONSULTS OBTAINED:  Treatment Team:  Wallace Cullens, MD  DRUG ALLERGIES:   Allergies  Allergen Reactions  . Propranolol     DISCHARGE MEDICATIONS:   Current Discharge Medication List    START taking these medications   Details  cyclobenzaprine (FLEXERIL) 5 MG tablet Take 1 tablet (5 mg total) by mouth 3 (three) times daily as needed for muscle spasms. Qty: 20 tablet, Refills: 0    ferrous sulfate 325 (65 FE) MG tablet Take 1 tablet (325 mg total) by mouth 2 (two) times daily with a meal. Qty: 60 tablet, Refills: 3    pantoprazole (PROTONIX) 40 MG tablet Take 1 tablet (40 mg total) by mouth daily. Qty: 30 tablet, Refills: 0      CONTINUE these medications which have CHANGED   Details  HYDROcodone-acetaminophen (NORCO/VICODIN) 5-325 MG tablet Take 1 tablet by mouth every 6 (six) hours as needed for moderate pain or severe pain. Qty: 20 tablet, Refills: 0      CONTINUE these medications which have NOT CHANGED   Details  ALPRAZolam (XANAX) 0.25 MG tablet Take 0.25 mg by mouth daily as needed for anxiety.    amLODipine (NORVASC) 5 MG tablet Take 1 tablet by mouth daily.    atenolol (TENORMIN) 50 MG tablet Take 1 tablet by mouth daily.    gabapentin (NEURONTIN) 100 MG capsule Take 1 capsule by mouth 3 (three) times daily.    isosorbide mononitrate (IMDUR) 30 MG 24 hr tablet Take 1 tablet  by mouth daily.    lisinopril (PRINIVIL,ZESTRIL) 40 MG tablet Take 1 tablet by mouth daily.    SYNTHROID 75 MCG tablet Take 1 tablet by mouth daily.         DISCHARGE INSTRUCTIONS:    Follow with PMD in 1-2 weeks.  If you experience worsening of your admission symptoms, develop shortness of breath, life threatening emergency, suicidal or homicidal thoughts you must seek medical attention immediately by calling 911 or calling your MD immediately  if symptoms less severe.  You Must read complete instructions/literature along with all the  possible adverse reactions/side effects for all the Medicines you take and that have been prescribed to you. Take any new Medicines after you have completely understood and accept all the possible adverse reactions/side effects.   Please note  You were cared for by a hospitalist during your hospital stay. If you have any questions about your discharge medications or the care you received while you were in the hospital after you are discharged, you can call the unit and asked to speak with the hospitalist on call if the hospitalist that took care of you is not available. Once you are discharged, your primary care physician will handle any further medical issues. Please note that NO REFILLS for any discharge medications will be authorized once you are discharged, as it is imperative that you return to your primary care physician (or establish a relationship with a primary care physician if you do not have one) for your aftercare needs so that they can reassess your need for medications and monitor your lab values.    Today   CHIEF COMPLAINT:   Chief Complaint  Patient presents with  . Rectal Bleeding    HISTORY OF PRESENT ILLNESS:  Vanessa Lang  is a 82 y.o. female presents with GI bleed. Patient states that early this afternoon she had a bowel movement and noticed a fair amount of bright red blood. She then proceeded to have 4-5 more bowel movements at home. His bowel movements then turned darker with clots visible. She began to feel a little weak and came to the ED for evaluation. She had 2-3 more bloody bowel movements here. Lab workup is largely stable at this time. Vital signs are stable, including hemodynamics. Patient denies any abdominal pain or other symptoms. Denies any prior history of GI bleed. Hospitalists were called for admission     VITAL SIGNS:  Blood pressure 150/94, pulse 63, temperature 98.4 F (36.9 C), temperature source Oral, resp. rate 18, height  (1.778 m),  weight 66.271 kg (146 lb 1.6 oz), SpO2 98 %.  I/O:   Intake/Output Summary (Last 24 hours) at 05/29/15 0947 Last data filed at 05/28/15 1800  Gross per 24 hour  Intake    361 ml  Output      0 ml  Net    361 ml    PHYSICAL EXAMINATION:   GENERAL: 82 y.o.-year-old patient lying in the bed with no acute distress.  EYES: Pupils equal, round, reactive to light and accommodation. No scleral icterus. Extraocular muscles intact. Conjunctiva pale. HEENT: Head atraumatic, normocephalic. Oropharynx and nasopharynx clear.  NECK: Supple, no jugular venous distention. No thyroid enlargement, no tenderness.  LUNGS: Normal breath sounds bilaterally, no wheezing, rales,rhonchi or crepitation. No use of accessory muscles of respiration.  CARDIOVASCULAR: S1, S2 normal. No murmurs, rubs, or gallops.  ABDOMEN: Soft, nontender, nondistended. Bowel sounds present. No organomegaly or mass.  EXTREMITIES: No pedal edema, cyanosis, or clubbing.  NEUROLOGIC: Cranial nerves II through XII are intact. Muscle strength 5/5 in all extremities. Sensation intact. Gait not checked.  PSYCHIATRIC: The patient is alert and oriented x 3.  SKIN: No obvious rash, lesion, or ulcer.    DATA REVIEW:   CBC  Recent Labs Lab 05/28/15 0721  WBC 8.4  HGB 8.9*  HCT 26.6*  PLT 165    Chemistries   Recent Labs Lab 05/26/15 2101 05/27/15 0158  NA 138 140  K 3.9 3.8  CL 106 111  CO2 22 23  GLUCOSE 137* 122*  BUN 22* 22*  CREATININE 1.17* 1.05*  CALCIUM 9.4 8.3*  AST 17  --   ALT 14  --   ALKPHOS 61  --   BILITOT 0.7  --     Cardiac Enzymes No results for input(s): TROPONINI in the last 168 hours.  Microbiology Results  No results found for this or any previous visit.  RADIOLOGY:  No results found.  EKG:   Orders placed or performed in visit on 03/29/14  . EKG 12-Lead      Management plans discussed with the patient, family and they are in agreement.  CODE STATUS:     Code Status  Orders        Start     Ordered   05/27/15 0016  Full code   Continuous     05/27/15 0015    Code Status History    Date Active Date Inactive Code Status Order ID Comments User Context   This patient has a current code status but no historical code status.      TOTAL TIME TAKING CARE OF THIS PATIENT: 36 minutes.    [name] M.D on 05/29/2015 at 9:47 AM  Between 7am to 6pm - Pager - [phone]  After 6pm go to [email or web address] - password [email or web address] [facility]  [facility]  Office  [phone]  CC: Primary care physician; Dorothey Baseman, MD   Note: This dictation was prepared with Dragon dictation along with smaller phrase technology. Any transcriptional errors that result from this process are unintentional.

## 2015-05-29 NOTE — Progress Notes (Signed)
Pt given prescription x 1, pt given d/c instructions r/t activity, diet, followup care, and medications, voiced understanding, pt d/c home via wheelchair escorted by staff and friend

## 2015-05-29 NOTE — Care Management (Signed)
Admitted to [facility] with the diagnosis of GI Bleed. Lives alone. Last seen Dr. Terance Hart 3 months ago. [facility] 2 years ago. Uses a cane to aid in ambulation. Takes care of all basic activities of daily living herself. Friends help with errands. Last fall was 2 years ago. Good appetite, but eats little meals. Friend will transport home. Physical therapy evaluation completed. Recommends home with home health/physical therapy. Discussed home health agencies at the bedside. Chose [facility]. Will update Feliberto Gottron, [facility] representative, Discharge to home today per Dr. Elisabeth Pigeon. Gwenette Greet RN MSN CCM Care Management [phone]

## 2015-05-29 NOTE — Care Management Important Message (Signed)
Important Message  Patient Details  Name: Vanessa Lang MRN: [id number] Date of Birth: 1922-10-06   Medicare Important Message Given:  Yes    Olegario Messier A Avani Sensabaugh 05/29/2015, 10:12 AM

## 2015-12-06 ENCOUNTER — Emergency Department
Admission: EM | Admit: 2015-12-06 | Discharge: 2015-12-06 | Disposition: A | Discharge status: Home / Self Care | Payer: Federal, State, Local not specified - PPO | Attending: Emergency Medicine | Admitting: Emergency Medicine

## 2015-12-06 ENCOUNTER — Emergency Department: Payer: Federal, State, Local not specified - PPO

## 2015-12-06 DIAGNOSIS — R0602 Shortness of breath: Secondary | ICD-10-CM | POA: Diagnosis not present

## 2015-12-06 DIAGNOSIS — I5032 Chronic diastolic (congestive) heart failure: Secondary | ICD-10-CM | POA: Insufficient documentation

## 2015-12-06 DIAGNOSIS — M542 Cervicalgia: Secondary | ICD-10-CM | POA: Diagnosis present

## 2015-12-06 DIAGNOSIS — I110 Hypertensive heart disease with heart failure: Secondary | ICD-10-CM | POA: Diagnosis not present

## 2015-12-06 DIAGNOSIS — Z79899 Other long term (current) drug therapy: Secondary | ICD-10-CM | POA: Insufficient documentation

## 2015-12-06 DIAGNOSIS — Z87891 Personal history of nicotine dependence: Secondary | ICD-10-CM | POA: Diagnosis not present

## 2015-12-06 DIAGNOSIS — M791 Myalgia: Secondary | ICD-10-CM | POA: Insufficient documentation

## 2015-12-06 DIAGNOSIS — Z950 Presence of cardiac pacemaker: Secondary | ICD-10-CM | POA: Insufficient documentation

## 2015-12-06 DIAGNOSIS — E039 Hypothyroidism, unspecified: Secondary | ICD-10-CM | POA: Insufficient documentation

## 2015-12-06 DIAGNOSIS — M7918 Myalgia, other site: Secondary | ICD-10-CM

## 2015-12-06 LAB — COMPREHENSIVE METABOLIC PANEL
ALK PHOS: 62 U/L (ref 38–126)
ALT: 13 U/L — AB (ref 14–54)
ANION GAP: 8 (ref 5–15)
AST: 18 U/L (ref 15–41)
Albumin: 4.5 g/dL (ref 3.5–5.0)
BILIRUBIN TOTAL: 0.8 mg/dL (ref 0.3–1.2)
BUN: 17 mg/dL (ref 6–20)
CHLORIDE: 104 mmol/L (ref 101–111)
CO2: 27 mmol/L (ref 22–32)
CREATININE: 1.27 mg/dL — AB (ref 0.44–1.00)
Calcium: 9.8 mg/dL (ref 8.9–10.3)
GFR, EST AFRICAN AMERICAN: 41 mL/min — AB (ref >60)
GFR, EST NON AFRICAN AMERICAN: 36 mL/min — AB (ref >60)
Glucose, Bld: 107 mg/dL — ABNORMAL HIGH (ref 65–99)
Potassium: 4.5 mmol/L (ref 3.5–5.1)
SODIUM: 139 mmol/L (ref 135–145)
Total Protein: 8 g/dL (ref 6.5–8.1)

## 2015-12-06 LAB — CBC
HEMATOCRIT: 37.2 % (ref 35.0–47.0)
Hemoglobin: 12.6 g/dL (ref 12.0–16.0)
MCH: 32.5 pg (ref 26.0–34.0)
MCHC: 33.9 g/dL (ref 32.0–36.0)
MCV: 95.9 fL (ref 80.0–100.0)
Platelets: 197 10*3/uL (ref 150–440)
RBC: 3.88 MIL/uL (ref 3.80–5.20)
RDW: 14 % (ref 11.5–14.5)
WBC: 8.4 10*3/uL (ref 3.6–11.0)

## 2015-12-06 LAB — TROPONIN I: Troponin I: <0.03 ng/mL (ref <0.03)

## 2015-12-06 MED ORDER — HYDROCODONE-ACETAMINOPHEN 5-325 MG PO TABS
1.0000 | ORAL_TABLET | Freq: Once | ORAL | Status: AC
  Administered 2015-12-06: 1 via ORAL
  Filled 2015-12-06: qty 1

## 2015-12-06 MED ORDER — HYDROCODONE-ACETAMINOPHEN 5-325 MG PO TABS: 1.0000 | ORAL_TABLET | ORAL | 0 refills | Status: AC | PRN

## 2015-12-06 NOTE — ED Triage Notes (Signed)
Pt sent from [facility] ([facility]) with c/o left shoulder and neck pain with SOB for the past 3-4 days.. Denies pain with movement, states it is constant.

## 2015-12-06 NOTE — ED Provider Notes (Signed)
[facility] Emergency Department Provider Note  Time seen: 9:31 AM  I have reviewed the triage vital signs and the nursing notes.   HISTORY  Chief Complaint Neck Pain and Shortness of Breath    HPI Vanessa Lang is a 82 y.o. female with a past medical history of CHF, pacemaker, depression, hypertension, hyperlipidemia, who presents the emergency department with 4 days of left shoulder pain. According to the patient for the past 4 days she has been experiencing left shoulder and left-sided neck discomfort. Patient states a history of arthritis for which she is prescribed Norco chronically. She also states over the past 4 days she is intermittently become short of breath. Denies any shortness of breath currently. Denies any chest pain at any point. Denies any shortness of breath with exertion, states it comes and goes randomly. Denies any recent cough, congestion or fever. Denies any known trauma. Patient states the area feels better if she rubs on it.  Past Medical History:  Diagnosis Date  . CHF (congestive heart failure) (HCC)   . Depression   . Gout   . HLD (hyperlipidemia)   . HTN (hypertension)   . Hypothyroidism     Patient Active Problem List   Diagnosis Date Noted  . GI bleed 05/26/2015  . HTN (hypertension) 05/26/2015  . Hypothyroid 05/26/2015  . Chronic diastolic CHF (congestive heart failure) (HCC) 05/26/2015  . AKI (acute kidney injury) (HCC) 05/26/2015    Past Surgical History:  Procedure Laterality Date  . ABDOMINAL HYSTERECTOMY    . PACEMAKER INSERTION    . THYROIDECTOMY      Prior to Admission medications   Medication Sig Start Date End Date Taking? Authorizing Provider  ALPRAZolam Prudy Feeler) 0.25 MG tablet Take 0.25 mg by mouth daily as needed for anxiety.    Historical Provider, MD  amLODipine (NORVASC) 5 MG tablet Take 1 tablet by mouth daily. 05/02/15   Historical Provider, MD  atenolol (TENORMIN) 50 MG tablet Take 1 tablet by  mouth daily. 03/22/15   Historical Provider, MD  cyclobenzaprine (FLEXERIL) 5 MG tablet Take 1 tablet (5 mg total) by mouth 3 (three) times daily as needed for muscle spasms. 05/29/15   [name], MD  ferrous sulfate 325 (65 FE) MG tablet Take 1 tablet (325 mg total) by mouth 2 (two) times daily with a meal. 05/29/15   [name], MD  gabapentin (NEURONTIN) 100 MG capsule Take 1 capsule by mouth 3 (three) times daily. 03/22/15   Historical Provider, MD  HYDROcodone-acetaminophen (NORCO/VICODIN) 5-325 MG tablet Take 1 tablet by mouth every 6 (six) hours as needed for moderate pain or severe pain. 05/29/15   [name], MD  isosorbide mononitrate (IMDUR) 30 MG 24 hr tablet Take 1 tablet by mouth daily. 05/11/15   Historical Provider, MD  lisinopril (PRINIVIL,ZESTRIL) 40 MG tablet Take 1 tablet by mouth daily. 05/11/15   Historical Provider, MD  pantoprazole (PROTONIX) 40 MG tablet Take 1 tablet (40 mg total) by mouth daily. 05/29/15   [name], MD  SYNTHROID 75 MCG tablet Take 1 tablet by mouth daily. 05/22/15   Historical Provider, MD    Allergies  Allergen Reactions  . Propranolol     Family History  Problem Relation Age of Onset  . Thyroid disease Mother   . Hypertension Sister   . Thyroid disease Sister   . Heart attack Brother   . Cancer Brother   . Colon cancer Sister     Social History Social History  Substance Use Topics  . Smoking status: Former Games developer  . Smokeless tobacco: Not on file  . Alcohol use No    Review of Systems Constitutional: Negative for fever. Cardiovascular: Negative for chest pain. Respiratory: Intermittent shortness of breath Gastrointestinal: Negative for abdominal pain Musculoskeletal: Left neck/shoulder pain Neurological: Negative for headaches, focal weakness or numbness. 10-point ROS otherwise negative.  ____________________________________________   PHYSICAL EXAM:  Constitutional: Alert and oriented.  Well appearing and in no distress. Eyes: Normal exam ENT   Head: Normocephalic and atraumatic.   Mouth/Throat: Mucous membranes are moist. Cardiovascular: Normal rate, regular rhythm. No murmur Respiratory: Normal respiratory effort without tachypnea nor retractions. Breath sounds are clear and equal bilaterally. No wheezes/rales/rhonchi. Gastrointestinal: Soft and nontender. No distention.  Musculoskeletal: Mild tenderness to palpation elicited of the left neck back or shoulder. Neurologic:  Normal speech and language. No gross focal neurologic deficit Skin:  Skin is warm, dry and intact.  Psychiatric: Mood and affect are normal. Speech and behavior are normal.   ____________________________________________    EKG  EKG reviewed and interpreted by myself shows an atrial paced rhythm at 66 bpm, normal axis, nonspecific but no concerning ST changes.  ____________________________________________    RADIOLOGY  Chest x-ray negative  ____________________________________________   INITIAL IMPRESSION / ASSESSMENT AND PLAN / ED COURSE  Pertinent labs & imaging results that were available during my care of the patient were reviewed by me and considered in my medical decision making (see chart for details).  Patient presents with 4 days of intermittent shortness of breath, left neck and shoulder discomfort. Has a history of arthritis, but became concerned due to the intermittent shortness of breath so she came for evaluation. We will check labs, chest x-ray, EKG, and closer monitor in the emergency department. Overall the patient appears very well, no distress.   Patient's labs within normal limits. Troponin is negative. Chest x-rays negative. Patient ambulates well with her cane. Patient states she is or even go home. I discussed with the patient taking a full Norco every 6 hours as needed for discomfort, the patient is currently only taking half a Norco tablet when she feels  discomfort.  patient's symptoms appear to be in the distribution of her trapezius muscle on the left side.  ____________________________________________   FINAL CLINICAL IMPRESSION(S) / ED DIAGNOSES  Musculoskeletal pain    Minna Antis, MD 12/06/15 1131

## 2015-12-06 NOTE — ED Notes (Signed)
MD at bedside. 

## 2015-12-06 NOTE — ED Notes (Signed)
Pt ambulated in the room with a cane, slow and steady gait

## 2015-12-06 NOTE — Discharge Instructions (Signed)
As we discussed please take your Norco as needed for discomfort, as prescribed by your doctor. He may also use Tylenol or ibuprofen for discomfort. Return to the emergency department for any chest pain, any trouble breathing, or any weakness or numbness of any arm or leg, confusion or slurred speech. Please follow-up your doctor, call today for the next available appointment.

## 2018-06-28 ENCOUNTER — Encounter: Payer: Self-pay | Admitting: Emergency Medicine

## 2018-06-28 ENCOUNTER — Inpatient Hospital Stay
Admission: EM | Admit: 2018-06-28 | Discharge: 2018-07-03 | DRG: 982 | Disposition: A | Discharge status: Home Health | Payer: Medicare Other | Attending: Internal Medicine | Admitting: Internal Medicine

## 2018-06-28 ENCOUNTER — Inpatient Hospital Stay: Payer: Medicare Other

## 2018-06-28 ENCOUNTER — Other Ambulatory Visit: Payer: Self-pay

## 2018-06-28 DIAGNOSIS — N289 Disorder of kidney and ureter, unspecified: Secondary | ICD-10-CM | POA: Diagnosis present

## 2018-06-28 DIAGNOSIS — Z7982 Long term (current) use of aspirin: Secondary | ICD-10-CM

## 2018-06-28 DIAGNOSIS — Z23 Encounter for immunization: Secondary | ICD-10-CM | POA: Diagnosis present

## 2018-06-28 DIAGNOSIS — K4090 Unilateral inguinal hernia, without obstruction or gangrene, not specified as recurrent: Secondary | ICD-10-CM | POA: Diagnosis present

## 2018-06-28 DIAGNOSIS — D62 Acute posthemorrhagic anemia: Secondary | ICD-10-CM | POA: Diagnosis present

## 2018-06-28 DIAGNOSIS — I459 Conduction disorder, unspecified: Secondary | ICD-10-CM | POA: Diagnosis present

## 2018-06-28 DIAGNOSIS — Z8249 Family history of ischemic heart disease and other diseases of the circulatory system: Secondary | ICD-10-CM

## 2018-06-28 DIAGNOSIS — Z888 Allergy status to other drugs, medicaments and biological substances status: Secondary | ICD-10-CM

## 2018-06-28 DIAGNOSIS — E785 Hyperlipidemia, unspecified: Secondary | ICD-10-CM | POA: Diagnosis present

## 2018-06-28 DIAGNOSIS — E890 Postprocedural hypothyroidism: Secondary | ICD-10-CM | POA: Diagnosis present

## 2018-06-28 DIAGNOSIS — Z87891 Personal history of nicotine dependence: Secondary | ICD-10-CM

## 2018-06-28 DIAGNOSIS — Z66 Do not resuscitate: Secondary | ICD-10-CM | POA: Diagnosis present

## 2018-06-28 DIAGNOSIS — K5733 Diverticulitis of large intestine without perforation or abscess with bleeding: Principal | ICD-10-CM | POA: Diagnosis present

## 2018-06-28 DIAGNOSIS — Z8349 Family history of other endocrine, nutritional and metabolic diseases: Secondary | ICD-10-CM

## 2018-06-28 DIAGNOSIS — I5032 Chronic diastolic (congestive) heart failure: Secondary | ICD-10-CM | POA: Diagnosis present

## 2018-06-28 DIAGNOSIS — M109 Gout, unspecified: Secondary | ICD-10-CM | POA: Diagnosis present

## 2018-06-28 DIAGNOSIS — E440 Moderate protein-calorie malnutrition: Secondary | ICD-10-CM | POA: Diagnosis present

## 2018-06-28 DIAGNOSIS — Z90710 Acquired absence of both cervix and uterus: Secondary | ICD-10-CM

## 2018-06-28 DIAGNOSIS — Z9049 Acquired absence of other specified parts of digestive tract: Secondary | ICD-10-CM

## 2018-06-28 DIAGNOSIS — Z79899 Other long term (current) drug therapy: Secondary | ICD-10-CM

## 2018-06-28 DIAGNOSIS — F329 Major depressive disorder, single episode, unspecified: Secondary | ICD-10-CM | POA: Diagnosis present

## 2018-06-28 DIAGNOSIS — R001 Bradycardia, unspecified: Secondary | ICD-10-CM | POA: Diagnosis present

## 2018-06-28 DIAGNOSIS — K921 Melena: Secondary | ICD-10-CM

## 2018-06-28 DIAGNOSIS — Z681 Body mass index (BMI) 19 or less, adult: Secondary | ICD-10-CM | POA: Diagnosis not present

## 2018-06-28 DIAGNOSIS — Z79890 Hormone replacement therapy (postmenopausal): Secondary | ICD-10-CM

## 2018-06-28 DIAGNOSIS — K922 Gastrointestinal hemorrhage, unspecified: Secondary | ICD-10-CM | POA: Diagnosis present

## 2018-06-28 DIAGNOSIS — Z950 Presence of cardiac pacemaker: Secondary | ICD-10-CM

## 2018-06-28 DIAGNOSIS — I471 Supraventricular tachycardia: Secondary | ICD-10-CM | POA: Diagnosis not present

## 2018-06-28 DIAGNOSIS — I110 Hypertensive heart disease with heart failure: Secondary | ICD-10-CM | POA: Diagnosis present

## 2018-06-28 HISTORY — DX: Unspecified intestinal obstruction, unspecified as to partial versus complete obstruction: K56.609

## 2018-06-28 HISTORY — PX: IR ANGIOGRAM VISCERAL SELECTIVE: IMG657

## 2018-06-28 HISTORY — DX: Diverticulitis of intestine, part unspecified, without perforation or abscess without bleeding: K57.92

## 2018-06-28 LAB — COMPREHENSIVE METABOLIC PANEL
ALT: 13 U/L (ref 0–44)
AST: 22 U/L (ref 15–41)
Albumin: 4.4 g/dL (ref 3.5–5.0)
Alkaline Phosphatase: 67 U/L (ref 38–126)
Anion gap: 9 (ref 5–15)
BUN: 36 mg/dL — ABNORMAL HIGH (ref 8–23)
CO2: 24 mmol/L (ref 22–32)
Calcium: 9.2 mg/dL (ref 8.9–10.3)
Chloride: 103 mmol/L (ref 98–111)
Creatinine, Ser: 1.49 mg/dL — ABNORMAL HIGH (ref 0.44–1.00)
GFR calc Af Amer: 35 mL/min — ABNORMAL LOW (ref >60)
GFR calc non Af Amer: 30 mL/min — ABNORMAL LOW (ref >60)
GLUCOSE: 117 mg/dL — AB (ref 70–99)
Potassium: 3.9 mmol/L (ref 3.5–5.1)
Sodium: 136 mmol/L (ref 135–145)
Total Bilirubin: 0.4 mg/dL (ref 0.3–1.2)
Total Protein: 7.6 g/dL (ref 6.5–8.1)

## 2018-06-28 LAB — CBC
HCT: 33.8 % — ABNORMAL LOW (ref 36.0–46.0)
Hemoglobin: 11 g/dL — ABNORMAL LOW (ref 12.0–15.0)
MCH: 31.1 pg (ref 26.0–34.0)
MCHC: 32.5 g/dL (ref 30.0–36.0)
MCV: 95.5 fL (ref 80.0–100.0)
Platelets: 178 10*3/uL (ref 150–400)
RBC: 3.54 MIL/uL — ABNORMAL LOW (ref 3.87–5.11)
RDW: 12.7 % (ref 11.5–15.5)
WBC: 12.4 10*3/uL — ABNORMAL HIGH (ref 4.0–10.5)
nRBC: 0 % (ref 0.0–0.2)

## 2018-06-28 LAB — HEMOGLOBIN AND HEMATOCRIT, BLOOD
HCT: 29 % — ABNORMAL LOW (ref 36.0–46.0)
HCT: 25.6 % — ABNORMAL LOW (ref 36.0–46.0)
HCT: 25.2 % — ABNORMAL LOW (ref 36.0–46.0)
Hemoglobin: 9.6 g/dL — ABNORMAL LOW (ref 12.0–15.0)
Hemoglobin: 8 g/dL — ABNORMAL LOW (ref 12.0–15.0)
Hemoglobin: 8.3 g/dL — ABNORMAL LOW (ref 12.0–15.0)

## 2018-06-28 LAB — MRSA PCR SCREENING: MRSA by PCR: NEGATIVE

## 2018-06-28 LAB — PROTIME-INR
INR: 1.07
Prothrombin Time: 13.8 seconds (ref 11.4–15.2)

## 2018-06-28 LAB — APTT: aPTT: 30 seconds (ref 24–36)

## 2018-06-28 LAB — TSH: TSH: 1.76 u[IU]/mL (ref 0.350–4.500)

## 2018-06-28 LAB — GLUCOSE, CAPILLARY: Glucose-Capillary: 91 mg/dL (ref 70–99)

## 2018-06-28 MED ORDER — IOHEXOL 350 MG/ML SOLN
75.0000 mL | Freq: Once | INTRAVENOUS | Status: AC | PRN
  Administered 2018-06-28: 80 mL via INTRAVENOUS

## 2018-06-28 MED ORDER — SODIUM CHLORIDE 0.9 % IV SOLN
10.0000 mL/h | Freq: Once | INTRAVENOUS | Status: AC
  Administered 2018-06-29: 10 mL/h via INTRAVENOUS

## 2018-06-28 MED ORDER — SODIUM CHLORIDE 0.9 % IV SOLN
INTRAVENOUS | Status: DC
  Administered 2018-06-28 – 2018-06-30 (×6): via INTRAVENOUS

## 2018-06-28 MED ORDER — IOPAMIDOL (ISOVUE-300) INJECTION 61%
100.0000 mL | Freq: Once | INTRAVENOUS | Status: AC | PRN
  Administered 2018-06-28: 85 mL via INTRA_ARTERIAL

## 2018-06-28 MED ORDER — DOPAMINE-DEXTROSE 3.2-5 MG/ML-% IV SOLN
INTRAVENOUS | Status: AC
  Filled 2018-06-28: qty 250

## 2018-06-28 MED ORDER — PHENYLEPHRINE 40 MCG/ML (10ML) SYRINGE FOR IV PUSH (FOR BLOOD PRESSURE SUPPORT)
PREFILLED_SYRINGE | INTRAVENOUS | Status: AC | PRN
  Administered 2018-06-28: 1 ug via INTRAVENOUS

## 2018-06-28 MED ORDER — ACETAMINOPHEN 650 MG RE SUPP: 650.0000 mg | Freq: Four times a day (QID) | RECTAL | Status: DC | PRN

## 2018-06-28 MED ORDER — PNEUMOCOCCAL VAC POLYVALENT 25 MCG/0.5ML IJ INJ
0.5000 mL | INJECTION | INTRAMUSCULAR | Status: AC
  Administered 2018-06-30: 0.5 mL via INTRAMUSCULAR
  Filled 2018-06-28: qty 0.5

## 2018-06-28 MED ORDER — LEVOTHYROXINE SODIUM 75 MCG PO TABS
75.0000 ug | ORAL_TABLET | Freq: Every day | ORAL | Status: DC
  Administered 2018-06-28 – 2018-07-03 (×6): 75 ug via ORAL
  Filled 2018-06-28 (×6): qty 1

## 2018-06-28 MED ORDER — MIDAZOLAM HCL 5 MG/5ML IJ SOLN
INTRAMUSCULAR | Status: AC
  Filled 2018-06-28: qty 5

## 2018-06-28 MED ORDER — TRANEXAMIC ACID-NACL 1000-0.7 MG/100ML-% IV SOLN
1000.0000 mg | INTRAVENOUS | Status: AC
  Administered 2018-06-28: 1000 mg via INTRAVENOUS
  Filled 2018-06-28: qty 100

## 2018-06-28 MED ORDER — LIDOCAINE HCL (PF) 1 % IJ SOLN
INTRAMUSCULAR | Status: AC
  Filled 2018-06-28: qty 30

## 2018-06-28 MED ORDER — SODIUM CHLORIDE 0.9 % IV SOLN
INTRAVENOUS | Status: AC | PRN
  Administered 2018-06-28: 10 mL/h via INTRAVENOUS

## 2018-06-28 MED ORDER — NOREPINEPHRINE 4 MG/250ML-% IV SOLN
INTRAVENOUS | Status: AC | PRN
  Administered 2018-06-28: 10 ug/min via INTRAVENOUS

## 2018-06-28 MED ORDER — ACETAMINOPHEN 325 MG PO TABS
650.0000 mg | ORAL_TABLET | Freq: Four times a day (QID) | ORAL | Status: DC | PRN
  Administered 2018-07-02: 650 mg via ORAL
  Filled 2018-06-28 (×2): qty 2

## 2018-06-28 MED ORDER — DOPAMINE-DEXTROSE 3.2-5 MG/ML-% IV SOLN
INTRAVENOUS | Status: AC | PRN
  Administered 2018-06-28: 5 ug/kg/min via INTRAVENOUS
  Administered 2018-06-28: 10 ug/kg/min via INTRAVENOUS

## 2018-06-28 MED ORDER — ONDANSETRON HCL 4 MG PO TABS: 4.0000 mg | ORAL_TABLET | Freq: Four times a day (QID) | ORAL | Status: DC | PRN

## 2018-06-28 MED ORDER — FENTANYL CITRATE (PF) 100 MCG/2ML IJ SOLN
INTRAMUSCULAR | Status: AC | PRN
  Administered 2018-06-28 (×2): 25 ug via INTRAVENOUS

## 2018-06-28 MED ORDER — PANTOPRAZOLE SODIUM 40 MG IV SOLR
40.0000 mg | Freq: Two times a day (BID) | INTRAVENOUS | Status: DC
  Administered 2018-06-28 – 2018-07-03 (×11): 40 mg via INTRAVENOUS
  Filled 2018-06-28 (×11): qty 40

## 2018-06-28 MED ORDER — MIDAZOLAM HCL 5 MG/5ML IJ SOLN
INTRAMUSCULAR | Status: AC | PRN
  Administered 2018-06-28: 1 mg via INTRAVENOUS

## 2018-06-28 MED ORDER — TECHNETIUM TC 99M-LABELED RED BLOOD CELLS IV KIT
20.0000 | PACK | Freq: Once | INTRAVENOUS | Status: AC | PRN
  Administered 2018-06-28: 21.459 via INTRAVENOUS

## 2018-06-28 MED ORDER — OXYCODONE-ACETAMINOPHEN 5-325 MG PO TABS
1.0000 | ORAL_TABLET | ORAL | Status: DC | PRN
  Administered 2018-06-28 – 2018-07-01 (×4): 1 via ORAL
  Filled 2018-06-28 (×5): qty 1

## 2018-06-28 MED ORDER — PHENYLEPHRINE HCL 10 MG/ML IJ SOLN
INTRAMUSCULAR | Status: AC
  Filled 2018-06-28: qty 1

## 2018-06-28 MED ORDER — ONDANSETRON HCL 4 MG/2ML IJ SOLN: 4.0000 mg | Freq: Four times a day (QID) | INTRAMUSCULAR | Status: DC | PRN

## 2018-06-28 MED ORDER — FENTANYL CITRATE (PF) 100 MCG/2ML IJ SOLN
INTRAMUSCULAR | Status: AC
  Filled 2018-06-28: qty 4

## 2018-06-28 NOTE — ED Notes (Addendum)
Pt turned and cleaned. Noted large amount of dark red blood from rectum, will notify admitting physician

## 2018-06-28 NOTE — Progress Notes (Signed)
[facility] [place] [facility]   PATIENT NAME: Vanessa Lang    MR#:  [id number]  DATE OF BIRTH:  Nov 13, 1922  SUBJECTIVE:  CHIEF COMPLAINT:   Chief Complaint  Patient presents with  . Rectal Bleeding   Patient admitted this morning with rectal bleeding.  Maroon-colored bleeding.  Has had multiple episodes.  Vital signs are stable at the time of my evaluation.  Due to recurrent episodes of bleeding, I discussed case with gastroenterologist and recommendation was for bleeding scan which was done and came back positive.  REVIEW OF SYSTEMS:  Review of Systems  Constitutional: Negative for chills and fever.  HENT: Negative for hearing loss and tinnitus.   Eyes: Negative for blurred vision and double vision.  Respiratory: Negative for cough, hemoptysis and sputum production.   Cardiovascular: Negative for chest pain and palpitations.  Gastrointestinal: Negative for abdominal pain, heartburn and nausea.       Ongoing rectal bleed  Genitourinary: Negative for dysuria and urgency.  Musculoskeletal: Negative for myalgias and neck pain.  Skin: Negative for itching and rash.  Neurological: Negative for dizziness and headaches.  Psychiatric/Behavioral: Negative for depression and hallucinations.      DRUG ALLERGIES:   Allergies  Allergen Reactions  . Propranolol Rash   VITALS:  Blood pressure 132/72, pulse 60, temperature 97.7 F (36.5 C), temperature source Oral, resp. rate (!) 22, height 5' 10.5" (1.791 m), weight 61.7 kg, SpO2 97 %. PHYSICAL EXAMINATION:  Physical Exam  Constitutional: She is oriented to person, place, and time. She appears well-developed and well-nourished.  HENT:  Head: Normocephalic and atraumatic.  Eyes: Pupils are equal, round, and reactive to light. Conjunctivae and EOM are normal. Right eye exhibits no discharge.  Neck: Normal range of motion. Neck supple. No tracheal deviation present.  Cardiovascular: Normal rate, regular rhythm  and normal heart sounds.  Respiratory: Effort normal and breath sounds normal. She has no wheezes.  GI: Soft. Bowel sounds are normal. There is no abdominal tenderness.  Musculoskeletal: Normal range of motion.        General: No edema.  Neurological: She is alert and oriented to person, place, and time. No cranial nerve deficit.  Skin: Skin is warm. She is not diaphoretic. No erythema.  Psychiatric: She has a normal mood and affect. Her behavior is normal.    LABORATORY PANEL:  Female CBC Recent Labs  Lab 06/28/18 0151 06/28/18 0856  WBC 12.4*  --   HGB 11.0* 9.6*  HCT 33.8* 29.0*  PLT 178  --    ------------------------------------------------------------------------------------------------------------------ Chemistries  Recent Labs  Lab 06/28/18 0151  NA 136  K 3.9  CL 103  CO2 24  GLUCOSE 117*  BUN 36*  CREATININE 1.49*  CALCIUM 9.2  AST 22  ALT 13  ALKPHOS 67  BILITOT 0.4   RADIOLOGY:  Nm Gi Blood Loss  Result Date: 06/28/2018 CLINICAL DATA:  GI bleed. Started 1030 last night. Prior colectomy. EXAM: NUCLEAR MEDICINE GASTROINTESTINAL BLEEDING SCAN TECHNIQUE: Sequential abdominal images were obtained following intravenous administration of Tc-68m labeled red blood cells. RADIOPHARMACEUTICALS:  21.459 mCi Tc-81m pertechnetate in-vitro labeled red cells. COMPARISON:  None. FINDINGS: Progressively increasing and peristalsing radiotracer in the left upper abdomen extending into the left lower abdomen. Normal physiologic biodistribution of the radiotracer is demonstrated throughout the abdomen. IMPRESSION: Progressively increasing and peristalsing radiotracer in the left upper abdomen extending into the left lower abdomen. Findings are consistent with active gastrointestinal bleeding in the splenic flexure region versus small  bowel. Critical Value/emergent results were called by telephone at the time of interpretation on 06/28/2018 at 1:30 pm to Dr. Jama Flavors , who verbally  acknowledged these results. Electronically Signed   By: Elige Ko   On: 06/28/2018 13:30   ASSESSMENT AND PLAN:    1.  GI bleed: Patient presented with melena with some intermittent hematochezia.  Has had multiple maroon-colored bleeding today. I discussed case with gastroenterologist on-call who recommended bleeding scan since patient not prepped for colonoscopy. Bleeding scan came back positive.  Discussed case with interventional radiologist Dr. Deanne Coffer.  Arrangements being made for CTA to localize source of bleeding and then patient to have angiogram done. All the vital signs currently stable, patient already complained of feeling dizzy and lightheaded. Nursing staff to transfuse with 2 units of packed red blood cells.  IV fluid hydration.  Continue IV Protonix Changed status to ICU for closer monitoring. Consult placed for intensivist and case discussed with intensivist.  2.  Hypertension: Controlled; hold blood pressure medications for now  3.  Hypothyroidism: TSH normal.  Continue Synthroid  4.  CHF: Chronic; diastolic.  Stable.   DVT prophylaxis: SCDs No heparin products due to GI bleed  All the records are reviewed and case discussed with Care Management/Social Worker. Management plans discussed with the patient, family and they are in agreement.  CODE STATUS: Full Code  TOTAL TIME TAKING CARE OF THIS PATIENT: 40 minutes.   More than 50% of the time was spent in counseling/coordination of care: YES  POSSIBLE D/C IN 3 DAYS, DEPENDING ON CLINICAL CONDITION.   Takiah Maiden M.D on 06/28/2018 at 2:30 PM  Between 7am to 6pm - Pager - [phone]  After 6pm go to [email or web address] - Social research officer, government  [facility]  Office  [phone]  CC: Primary care physician; Dorothey Baseman, MD  Note: This dictation was prepared with Dragon dictation along with smaller phrase technology. Any transcriptional errors that result from this process are  unintentional.

## 2018-06-28 NOTE — Sedation Documentation (Signed)
Levophed gtt. Started now . Dopamine gtt. OFF now.

## 2018-06-28 NOTE — ED Provider Notes (Signed)
[facility] Emergency Department Provider Note  ____________________________________________   First MD Initiated Contact with Patient 06/28/18 [phone]     (approximate)  I have reviewed the triage vital signs and the nursing notes.   HISTORY  Chief Complaint Rectal Bleeding    HPI Vanessa Lang is a 85 y.o. female with a history of diverticulitis and diverticulosis with prior GI bleed about 3 years ago.  In spite of her age she is very healthy, active, and mentally sharp with no evidence of dementia.  She presents with a family member by private vehicle for evaluation of acute onset and severe rectal bleeding.  She states that it started spontaneously this evening when she felt an urgent bowel movement and it was mostly blood.  It was relatively small volume but she has had at least 2 additional episodes that were larger volume.  She had one just after my initial evaluation of the patient in the emergency department that seems to be pure blood and a mixture of both dark and bright red blood.  She is having no pain.  She denies any abdominal cramping, nausea, vomiting, fever/chills, chest pain, shortness of breath.  She is not dizzy or lightheaded.      Past Medical History:  Diagnosis Date  . Bowel obstruction (HCC)   . CHF (congestive heart failure) (HCC)   . Depression   . Diverticulitis   . Gout   . HLD (hyperlipidemia)   . HTN (hypertension)   . Hypothyroidism     Patient Active Problem List   Diagnosis Date Noted  . GI bleed 05/26/2015  . HTN (hypertension) 05/26/2015  . Hypothyroid 05/26/2015  . Chronic diastolic CHF (congestive heart failure) (HCC) 05/26/2015  . AKI (acute kidney injury) (HCC) 05/26/2015    Past Surgical History:  Procedure Laterality Date  . ABDOMINAL HYSTERECTOMY    . ABDOMINAL SURGERY    . HERNIA REPAIR    . PACEMAKER INSERTION    . THYROIDECTOMY      Prior to Admission medications   Medication Sig Start Date  End Date Taking? Authorizing Provider  ALPRAZolam Prudy Feeler) 0.25 MG tablet Take 0.25 mg by mouth daily as needed for anxiety.   Yes [provider]  amLODipine (NORVASC) 5 MG tablet Take 1 tablet by mouth daily. 05/02/15  Yes [provider]  aspirin EC 81 MG tablet Take 81 mg by mouth daily.   Yes [provider]  atenolol (TENORMIN) 50 MG tablet Take 1 tablet by mouth daily. 03/22/15  Yes [provider]  donepezil (ARICEPT) 5 MG tablet Take 1 tablet by mouth daily. 11/07/15  Yes [provider]  escitalopram (LEXAPRO) 5 MG tablet Take 5 mg by mouth daily. For 90 days 06/04/18 09/02/18 Yes [provider]  ferrous sulfate 325 (65 FE) MG tablet Take 1 tablet (325 mg total) by mouth 2 (two) times daily with a meal. 05/29/15  Yes [name], MD  gabapentin (NEURONTIN) 100 MG capsule Take 1 capsule by mouth 3 (three) times daily. 03/22/15  Yes [provider]  hydrochlorothiazide (HYDRODIURIL) 25 MG tablet Take 1 tablet by mouth daily. 10/24/15  Yes [provider]  isosorbide mononitrate (IMDUR) 30 MG 24 hr tablet Take 1 tablet by mouth daily. 05/11/15  Yes [provider]  lisinopril (PRINIVIL,ZESTRIL) 40 MG tablet Take 1 tablet by mouth daily. 05/11/15  Yes [provider]  Multiple Vitamin (MULTI-VITAMIN DAILY PO) Take 1 tablet by mouth daily.   Yes  [provider]  pantoprazole (PROTONIX) 40 MG tablet Take 40 mg by mouth daily. 04/21/18  Yes [provider]  SYNTHROID 75 MCG tablet Take 1 tablet by mouth daily. 05/22/15  Yes [provider]    Allergies Propranolol  Family History  Problem Relation Age of Onset  . Thyroid disease Mother   . Hypertension Sister   . Thyroid disease Sister   . Heart attack Brother   . Cancer Brother   . Colon cancer Sister     Social History Social History   Tobacco Use  . Smoking status: Former Games developer  . Smokeless tobacco: Never Used   Substance Use Topics  . Alcohol use: No    Alcohol/week: 0.0 standard drinks  . Drug use: No    Review of Systems Constitutional: No fever/chills Eyes: No visual changes. ENT: No sore throat. Cardiovascular: Denies chest pain. Respiratory: Denies shortness of breath. Gastrointestinal: Acute GI bleed.  No abdominal pain.  No nausea, no vomiting.  No diarrhea.  No constipation. Genitourinary: Negative for dysuria. Musculoskeletal: Negative for neck pain.  Negative for back pain. Integumentary: Negative for rash. Neurological: Negative for headaches, focal weakness or numbness.   ____________________________________________   PHYSICAL EXAM:  VITAL SIGNS: ED Triage Vitals  Enc Vitals Group     BP 06/28/18 0141 (!) 187/98     Pulse Rate 06/28/18 0141 63     Resp 06/28/18 0141 17     Temp 06/28/18 0141 97.7 F (36.5 C)     Temp Source 06/28/18 0141 Oral     SpO2 06/28/18 0141 95 %     Weight 06/28/18 0142 61.7 kg (136 lb)     Height 06/28/18 0142 1.791 m (5' 10.5")     Head Circumference --      Peak Flow --      Pain Score 06/28/18 0142 2     Pain Loc --      Pain Edu? --      Excl. in GC? --     Constitutional: Alert and oriented.  No distress, well-appearing and active for her age. Eyes: Conjunctivae are normal.  Head: Atraumatic. Nose: No congestion/rhinnorhea. Mouth/Throat: Mucous membranes are moist. Neck: No stridor.  No meningeal signs.   Cardiovascular: Normal rate, regular rhythm. Good peripheral circulation. Grossly normal heart sounds. Respiratory: Normal respiratory effort.  No retractions. Lungs CTAB. Gastrointestinal: Soft and nontender. No distention.  Rectal: Gross melena and hematochezia.  ED chaperone present throughout exam. Musculoskeletal: No lower extremity tenderness nor edema. No gross deformities of extremities. Neurologic:  Normal speech and language. No gross focal neurologic deficits are appreciated.  Skin:  Skin is warm, dry and  intact. No rash noted. Psychiatric: Mood and affect are normal. Speech and behavior are normal.  ____________________________________________   LABS (all labs ordered are listed, but only abnormal results are displayed)  Labs Reviewed  COMPREHENSIVE METABOLIC PANEL - Abnormal; Notable for the following components:      Result Value   Glucose, Bld 117 (*)    BUN 36 (*)    Creatinine, Ser 1.49 (*)    GFR calc non Af Amer 30 (*)    GFR calc Af Amer 35 (*)    All other components within normal limits  CBC - Abnormal; Notable for the following components:   WBC 12.4 (*)    RBC 3.54 (*)    Hemoglobin 11.0 (*)    HCT 33.8 (*)    All other components within normal limits  PROTIME-INR  APTT  POC OCCULT BLOOD, ED  TYPE AND SCREEN   ____________________________________________  EKG  ED ECG REPORT I, Loleta Rose, the attending physician, personally viewed and interpreted this ECG.  Date: 06/28/2018 EKG Time: 4:43 AM Rate: 68 Rhythm: Atrial paced rhythm QRS Axis: normal Intervals: LVH ST/T Wave abnormalities: Non-specific ST segment / T-wave changes, but no clear evidence of acute ischemia. Narrative Interpretation: no definitive evidence of acute ischemia; does not meet STEMI criteria.   ____________________________________________  RADIOLOGY   ED MD interpretation: No indication for emergent imaging  Official radiology report(s): No results found.  ____________________________________________   PROCEDURES   Procedure(s) performed (including Critical Care):  Procedures   ____________________________________________   INITIAL IMPRESSION / MDM / ASSESSMENT AND PLAN / ED COURSE  As part of my medical decision making, I reviewed the following data within the electronic MEDICAL RECORD NUMBER History obtained from family, Nursing notes reviewed and incorporated, Labs reviewed , Old chart reviewed, Discussed with admitting physician  and Notes from prior ED visits         Differential diagnosis includes, but is not limited to, diverticular bleed, neoplasm, AV malformation, less likely upper GI source.  The patient has what seems to be a combination of bright red blood and melena, but based on the fact that she has a prior diverticular bleed and she is having the sensation of bowel urgency I think it is likely this is coming from her colon.  She is not having any abdominal pain or tenderness, no cramping, and no nausea nor vomiting.  Her lab work is reassuring with a stable creatinine of metabolic panel and a stable hemoglobin at this time although I suspect her repeat CBC will have gone down due to her acute blood loss.  Type and screen and coags are pending.  I am ordering TXA 1000 mg IV (although her creatinine clearance is decreased, I discussed it with Matt the pharmacist and we agreed that it would be better to go ahead and get her the medication rather than to try to adjust it given that there is unlikely to be any negative consequence to a higher dose and it may help with the acute bleeding).  I have paged the hospitalist at about 4:30 AM to discuss admission.  The patient is hemodynamically stable with 2 large-bore peripheral IVs.  I am holding off on IV fluids at this time.  Clinical Course as of Jun 28 530  Mon Jun 28, 2018  0786 I just discussed the case in person with Dr. Sheryle Hail who agrees with the plan and will admit.   [CF]    Clinical Course User Index [CF] Loleta Rose, MD    ____________________________________________  FINAL CLINICAL IMPRESSION(S) / ED DIAGNOSES  Final diagnoses:  Acute GI bleeding  Melena     MEDICATIONS GIVEN DURING THIS VISIT:  Medications  tranexamic acid (CYKLOKAPRON) IVPB 1,000 mg (1,000 mg Intravenous New Bag/Given 06/28/18 0524)     ED Discharge Orders    None       Note:  This document was prepared using Dragon voice recognition software and may include unintentional dictation errors.   Loleta Rose, MD 06/28/18 [phone]

## 2018-06-28 NOTE — Sedation Documentation (Signed)
Await amiodarone from pharmacy. Pt. AA&Ox3. Pt. Able to "cough" self out of runs of VT

## 2018-06-28 NOTE — Sedation Documentation (Signed)
NS to 750 ml/hr. Now  After Neo IVP. HR-82-20

## 2018-06-28 NOTE — Sedation Documentation (Signed)
Pt. Tx. To ICU-20 now via bed, with RN and 2 tech. Escort, on O2 and monitor. Stable for tx.

## 2018-06-28 NOTE — Sedation Documentation (Signed)
NS to 500 ml/hr. BP 141/60-81-16-96%. Dopamine gtt. Remains at 20 mcg/kg/min.

## 2018-06-28 NOTE — Sedation Documentation (Signed)
Total sedation: Versed 1 mg IV, Fentanyl 50 mcg IV. Pt. Tolerated procedure well 

## 2018-06-28 NOTE — Procedures (Signed)
  Procedure: Mesenteric angiogram no bleeding identified EBL:   minimal Complications:  Perforation of R CFA retroperitoneal branch, with hematoma, controlled with coil embolization  See full dictation in [employer].  Thora Lance MD Main # [phone] Pager  [phone]

## 2018-06-28 NOTE — ED Notes (Signed)
Admitting doctor messaged to inform that pt is c/o dizziness with increased fatigue

## 2018-06-28 NOTE — ED Notes (Addendum)
Large amount of maroon colored bm

## 2018-06-28 NOTE — Sedation Documentation (Signed)
Dr. Deanne Coffer notified of BP. Dopamine started at 10 mcg/kg/min.

## 2018-06-28 NOTE — Consult Note (Signed)
Patient been in the ER all day , Informed taged RBC scan positive for bleed and going to IR for intervention.   If GI consult required please feel free to call/text/page/Haiku me. Discussed with Dr Enid Baas no consult needed for now.   Dr Wyline Mood MD,MRCP [facility]) Gastroenterology/Hepatology Pager: [phone]

## 2018-06-28 NOTE — ED Notes (Signed)
Pt transferred over to hospital bed.  

## 2018-06-28 NOTE — Sedation Documentation (Signed)
300 ml bolus of 0.9 NS started-see procedure log.

## 2018-06-28 NOTE — H&P (Signed)
Vanessa Lang is an 85 y.o. female.   Chief Complaint: Rectal bleeding HPI: The patient with past medical history of hypertension, hypothyroidism, CHF and diverticulitis presents to the emergency department complaining of urgency of defecation and bleeding per rectum.  The patient reports initially feeling significant urge to have a bowel movement.  She then had a painless bloody stool.  She has had diverticulitis in the past but has never had bleeding.  She denies lightheadedness or dizziness, shortness of breath or palpitations.  In the emergency department the patient again had grossly bloody stools.  Throughout evaluation she remained stable and due to continued GI bleeding the emergency department staff called the hospitalist service for admission  Past Medical History:  Diagnosis Date  . Bowel obstruction (HCC)   . CHF (congestive heart failure) (HCC)   . Depression   . Diverticulitis   . Gout   . HLD (hyperlipidemia)   . HTN (hypertension)   . Hypothyroidism     Past Surgical History:  Procedure Laterality Date  . ABDOMINAL HYSTERECTOMY    . ABDOMINAL SURGERY    . HERNIA REPAIR    . PACEMAKER INSERTION    . THYROIDECTOMY      Family History  Problem Relation Age of Onset  . Thyroid disease Mother   . Hypertension Sister   . Thyroid disease Sister   . Heart attack Brother   . Cancer Brother   . Colon cancer Sister    Social History:  reports that she has quit smoking. She has never used smokeless tobacco. She reports that she does not drink alcohol or use drugs.  Allergies:  Allergies  Allergen Reactions  . Propranolol Rash    Prior to Admission medications   Medication Sig Start Date End Date Taking? Authorizing Provider  ALPRAZolam Prudy Feeler) 0.25 MG tablet Take 0.25 mg by mouth daily as needed for anxiety.   Yes [provider]  amLODipine (NORVASC) 5 MG tablet Take 1 tablet by mouth daily. 05/02/15  Yes [provider]  aspirin EC 81 MG tablet  Take 81 mg by mouth daily.   Yes [provider]  atenolol (TENORMIN) 50 MG tablet Take 1 tablet by mouth daily. 03/22/15  Yes [provider]  donepezil (ARICEPT) 5 MG tablet Take 1 tablet by mouth daily. 11/07/15  Yes [provider]  escitalopram (LEXAPRO) 5 MG tablet Take 5 mg by mouth daily. For 90 days 06/04/18 09/02/18 Yes [provider]  ferrous sulfate 325 (65 FE) MG tablet Take 1 tablet (325 mg total) by mouth 2 (two) times daily with a meal. 05/29/15  Yes [name], MD  gabapentin (NEURONTIN) 100 MG capsule Take 1 capsule by mouth 3 (three) times daily. 03/22/15  Yes [provider]  hydrochlorothiazide (HYDRODIURIL) 25 MG tablet Take 1 tablet by mouth daily. 10/24/15  Yes [provider]  isosorbide mononitrate (IMDUR) 30 MG 24 hr tablet Take 1 tablet by mouth daily. 05/11/15  Yes [provider]  lisinopril (PRINIVIL,ZESTRIL) 40 MG tablet Take 1 tablet by mouth daily. 05/11/15  Yes [provider]  Multiple Vitamin (MULTI-VITAMIN DAILY PO) Take 1 tablet by mouth daily.   Yes [provider]  pantoprazole (PROTONIX) 40 MG tablet Take 40 mg by mouth daily. 04/21/18  Yes [provider]  SYNTHROID 75 MCG tablet Take 1 tablet by mouth daily. 05/22/15  Yes [provider]     Results for orders placed or performed during the hospital encounter of 06/28/18 (  from the past 48 hour(s))  Comprehensive metabolic panel     Status: Abnormal   Collection Time: 06/28/18  1:51 AM  Result Value Ref Range   Sodium 136 135 - 145 mmol/L   Potassium 3.9 3.5 - 5.1 mmol/L   Chloride 103 98 - 111 mmol/L   CO2 24 22 - 32 mmol/L   Glucose, Bld 117 (H) 70 - 99 mg/dL   BUN 36 (H) 8 - 23 mg/dL   Creatinine, Ser 9.60 (H) 0.44 - 1.00 mg/dL   Calcium 9.2 8.9 - 45.4 mg/dL   Total Protein 7.6 6.5 - 8.1 g/dL   Albumin 4.4 3.5 - 5.0 g/dL   AST 22 15 - 41 U/L   ALT 13 0 - 44 U/L   Alkaline Phosphatase 67 38 -  126 U/L   Total Bilirubin 0.4 0.3 - 1.2 mg/dL   GFR calc non Af Amer 30 (L) >60 mL/min   GFR calc Af Amer 35 (L) >60 mL/min   Anion gap 9 5 - 15    Comment: Performed at [facility], [address] [place], [place] [address]  CBC     Status: Abnormal   Collection Time: 06/28/18  1:51 AM  Result Value Ref Range   WBC 12.4 (H) 4.0 - 10.5 K/uL   RBC 3.54 (L) 3.87 - 5.11 MIL/uL   Hemoglobin 11.0 (L) 12.0 - 15.0 g/dL   HCT 91.4 (L) 78.2 - 95.6 %   MCV 95.5 80.0 - 100.0 fL   MCH 31.1 26.0 - 34.0 pg   MCHC 32.5 30.0 - 36.0 g/dL   RDW 21.3 08.6 - 57.8 %   Platelets 178 150 - 400 K/uL   nRBC 0.0 0.0 - 0.2 %    Comment: Performed at [facility], [address] [place], [place] [address]   No results found.  Review of Systems  Constitutional: Negative for chills and fever.  HENT: Negative for sore throat and tinnitus.   Eyes: Negative for blurred vision and redness.  Respiratory: Negative for cough and shortness of breath.   Cardiovascular: Negative for chest pain, palpitations, orthopnea and PND.  Gastrointestinal: Positive for melena. Negative for abdominal pain, diarrhea, nausea and vomiting.  Genitourinary: Negative for dysuria, frequency and urgency.  Musculoskeletal: Negative for joint pain and myalgias.  Skin: Negative for rash.       No lesions  Neurological: Negative for speech change, focal weakness and weakness.  Endo/Heme/Allergies: Does not bruise/bleed easily.       No temperature intolerance  Psychiatric/Behavioral: Negative for depression and suicidal ideas.    Blood pressure 139/81, pulse (!) 58, temperature 97.7 F (36.5 C), temperature source Oral, resp. rate 18, height 5' 10.5" (1.791 m), weight 61.7 kg, SpO2 96 %. Physical Exam  Vitals reviewed. Constitutional: She is oriented to person, place, and time. She appears well-developed and well-nourished. No distress.  HENT:  Head: Normocephalic and atraumatic.  Mouth/Throat: Oropharynx is  clear and moist.  Eyes: Pupils are equal, round, and reactive to light. Conjunctivae and EOM are normal. No scleral icterus.  Neck: Normal range of motion. Neck supple. No JVD present. No tracheal deviation present. No thyromegaly present.  Cardiovascular: Normal rate, regular rhythm and normal heart sounds. Exam reveals no gallop and no friction rub.  No murmur heard. Respiratory: Effort normal and breath sounds normal.  GI: Soft. Bowel sounds are normal. She exhibits no distension. There is no abdominal tenderness.  Genitourinary:    Genitourinary Comments: Deferred  Musculoskeletal: Normal range of motion.        General: No edema.  Lymphadenopathy:    She has no cervical adenopathy.  Neurological: She is alert and oriented to person, place, and time. No cranial nerve deficit. She exhibits normal muscle tone.  Skin: Skin is warm and dry. No rash noted. No erythema.  Psychiatric: She has a normal mood and affect. Her behavior is normal. Judgment and thought content normal.     Assessment/Plan This is a 85 year old female admitted for GI bleed. 1.  GI bleed: Melena with some intermittent hematochezia.  Blood pressure stable.  I have started IV Protonix as well as maintenance fluid.  Consult gastroenterology. 2.  Hypertension: Controlled; hold blood pressure medications for now 3.  Hypothyroidism: Check TSH; continue Synthroid 4.  CHF: Chronic; diastolic.  Stable. 5.  DVT prophylaxis: SCDs 6.  GI prophylaxis: As above The patient is a full code.  Time spent on admission orders and patient care approximately 45 minutes  Arnaldo Natal, MD 06/28/2018, 5:40 AM

## 2018-06-28 NOTE — Sedation Documentation (Signed)
IVF's down to 250 ml/hr.

## 2018-06-28 NOTE — ED Notes (Signed)
Assisted patient to restroom during triage visit. Patient has large amount bloody stool in brief, with bad odor.AS

## 2018-06-28 NOTE — Sedation Documentation (Signed)
Dopamine gtt to 5 mcg/kg/min. BP [id number].

## 2018-06-28 NOTE — ED Notes (Signed)
Pt returned from nuclear medicine at this time 

## 2018-06-28 NOTE — Progress Notes (Signed)
patient with past medical history of hypertension, hypothyroidism, CHF and diverticulitis presents to the emergency department complaining of urgency of defecation and bleeding per rectum.    In emergency department the patient again had grossly bloody stools.   GI consulted   tagged RBC scan positive for bleed and going to IR for intervention.     IR Procedure:       Mesenteric angiogram no bleeding identified  Complications: Perforation of R CFA retroperitoneal branch, with hematoma, controlled with coil embolization  VS stable Admitted to SD for further assessment Transfuse one unit PRBC's Follow H/H     Lucie Leather, M.D.  [facility]  Medical Director [facility] Medical Director [facility] Cardio-Pulmonary Department

## 2018-06-28 NOTE — ED Notes (Signed)
Report given to Sabrina RN.

## 2018-06-28 NOTE — ED Notes (Signed)
ED TO INPATIENT HANDOFF REPORT  Name/Age/Gender Vanessa Lang 85 y.o. female  Code Status    Code Status Orders  (From admission, onward)         Start     Ordered   06/28/18 0800  Full code  Continuous     06/28/18 0759        Code Status History    Date Active Date Inactive Code Status Order ID Comments User Context   05/27/2015 0015 05/29/2015 1459 Full Code [id number]  Lance Coon, MD Inpatient      Home/SNF/Other UNKNOWN at this time  Chief Complaint Rectal Bleeding  Level of Care/Admitting Diagnosis ED Disposition    ED Disposition Condition [place]: [place] [100120]  Level of Care: ICU [6]  Diagnosis: GI bleed [195093]  Admitting Physician: Otila Back [place]  Attending Physician: Otila Back [[id number]]  Estimated length of stay: past midnight tomorrow  Certification:: I certify this patient will need inpatient services for at least 2 midnights  PT Class (Do Not Modify): Inpatient [101]  PT Acc Code (Do Not Modify): Private [1]       Medical History Past Medical History:  Diagnosis Date  . Bowel obstruction ([place])   . CHF (congestive heart failure) ([place])   . Depression   . Diverticulitis   . Gout   . HLD (hyperlipidemia)   . HTN (hypertension)   . Hypothyroidism     Allergies Allergies  Allergen Reactions  . Propranolol Rash    IV Location/Drains/Wounds Patient Lines/Drains/Airways Status   Active Line/Drains/Airways    Name:   Placement date:   Placement time:   Site:   Days:   Peripheral IV 06/28/18 Right Forearm   06/28/18    0420    Forearm   less than 1   Peripheral IV 06/28/18 Left Forearm   06/28/18    0420    Forearm   less than 1          Labs/Imaging Results for orders placed or performed during the hospital encounter of 06/28/18 (from the past 48 hour(s))  Comprehensive metabolic panel     Status: Abnormal   Collection Time: 06/28/18  1:51 AM  Result Value Ref Range   Sodium  136 135 - 145 mmol/L   Potassium 3.9 3.5 - 5.1 mmol/L   Chloride 103 98 - 111 mmol/L   CO2 24 22 - 32 mmol/L   Glucose, Bld 117 (H) 70 - 99 mg/dL   BUN 36 (H) 8 - 23 mg/dL   Creatinine, Ser 1.49 (H) 0.44 - 1.00 mg/dL   Calcium 9.2 8.9 - 10.3 mg/dL   Total Protein 7.6 6.5 - 8.1 g/dL   Albumin 4.4 3.5 - 5.0 g/dL   AST 22 15 - 41 U/L   ALT 13 0 - 44 U/L   Alkaline Phosphatase 67 38 - 126 U/L   Total Bilirubin 0.4 0.3 - 1.2 mg/dL   GFR calc non Af Amer 30 (L) >60 mL/min   GFR calc Af Amer 35 (L) >60 mL/min   Anion gap 9 5 - 15    Comment: Performed at [facility], [address] [place], [place] [address]  CBC     Status: Abnormal   Collection Time: 06/28/18  1:51 AM  Result Value Ref Range   WBC 12.4 (H) 4.0 - 10.5 K/uL   RBC 3.54 (L) 3.87 - 5.11 MIL/uL   Hemoglobin 11.0 (L) 12.0 -  15.0 g/dL   HCT 33.8 (L) 36.0 - 46.0 %   MCV 95.5 80.0 - 100.0 fL   MCH 31.1 26.0 - 34.0 pg   MCHC 32.5 30.0 - 36.0 g/dL   RDW 12.7 11.5 - 15.5 %   Platelets 178 150 - 400 K/uL   nRBC 0.0 0.0 - 0.2 %    Comment: Performed at [facility], [address] [place], [place] [address]  TSH     Status: None   Collection Time: 06/28/18  1:51 AM  Result Value Ref Range   TSH 1.760 0.350 - 4.500 uIU/mL    Comment: Performed by a 3rd Generation assay with a functional sensitivity of <=0.01 uIU/mL. Performed at [facility], [address] [place], [place] [address]   Type and screen Ordered by PROVIDER DEFAULT     Status: None (Preliminary result)   Collection Time: 06/28/18  5:14 AM  Result Value Ref Range   ABO/RH(D) B POS    Antibody Screen NEG    Sample Expiration      07/01/2018 Performed at [facility], [address] [place], [place] [address]    Unit Number [id number]    Blood Component Type RED CELLS,LR    Unit division 00    Status of Unit ALLOCATED    Transfusion Status OK TO TRANSFUSE    Crossmatch Result Compatible    Unit Number  [id number]    Blood Component Type RED CELLS,LR    Unit division 00    Status of Unit ALLOCATED    Transfusion Status OK TO TRANSFUSE    Crossmatch Result Compatible   Protime-INR     Status: None   Collection Time: 06/28/18  5:14 AM  Result Value Ref Range   Prothrombin Time 13.8 11.4 - 15.2 seconds   INR 1.07     Comment: Performed at [facility], [address] [place], [place] [address]  APTT     Status: None   Collection Time: 06/28/18  5:14 AM  Result Value Ref Range   aPTT 30 24 - 36 seconds    Comment: Performed at [facility], [address] [place], [place] [address]  Hemoglobin and hematocrit, blood     Status: Abnormal   Collection Time: 06/28/18  8:56 AM  Result Value Ref Range   Hemoglobin 9.6 (L) 12.0 - 15.0 g/dL   HCT 29.0 (L) 36.0 - 46.0 %    Comment: Performed at [facility], [address], [place], [place] [address]  Prepare RBC     Status: None   Collection Time: 06/28/18  2:06 PM  Result Value Ref Range   Order Confirmation      ORDER PROCESSED BY BLOOD BANK Performed at [facility], [address] [place], [place] [address]    Nm Gi Blood Loss  Result Date: 06/28/2018 CLINICAL DATA:  GI bleed. Started 1030 last night. Prior colectomy. EXAM: NUCLEAR MEDICINE GASTROINTESTINAL BLEEDING SCAN TECHNIQUE: Sequential abdominal images were obtained following intravenous administration of Tc-65mlabeled red blood cells. RADIOPHARMACEUTICALS:  21.459 mCi Tc-961mertechnetate in-vitro labeled red cells. COMPARISON:  None. FINDINGS: Progressively increasing and peristalsing radiotracer in the left upper abdomen extending into the left lower abdomen. Normal physiologic biodistribution of the radiotracer is demonstrated throughout the abdomen. IMPRESSION: Progressively increasing and peristalsing radiotracer in the left upper abdomen extending into the left lower abdomen. Findings are consistent with active gastrointestinal  bleeding in the splenic flexure region versus small bowel.  Critical Value/emergent results were called by telephone at the time of interpretation on 06/28/2018 at 1:30 pm to Dr. Otila Back , who verbally acknowledged these results. Electronically Signed   By: Kathreen Devoid   On: 06/28/2018 13:30    Pending Labs Unresulted Labs (From admission, onward)    Start     Ordered   06/29/18 0349  Basic metabolic panel  Tomorrow morning,   STAT     06/28/18 1356   06/29/18 0500  CBC  Tomorrow morning,   STAT     06/28/18 1356   06/29/18 0500  Magnesium  Tomorrow morning,   STAT     06/28/18 1356   06/28/18 1356  Hemoglobin and hematocrit, blood  Now then every 6 hours,   STAT     06/28/18 1356          Vitals/Pain Today's Vitals   06/28/18 1537 06/28/18 1539 06/28/18 1543 06/28/18 1545  BP:  (!) 86/53 (!) 71/48   Pulse:  68 68   Resp:  19 13   Temp:      TempSrc:      SpO2:  100% 99%   Weight:      Height:      PainSc: 0-No pain   0-No pain    Isolation Precautions No active isolations  Medications Medications  levothyroxine (SYNTHROID, LEVOTHROID) tablet 75 mcg (75 mcg Oral Given 06/28/18 1027)  acetaminophen (TYLENOL) tablet 650 mg (has no administration in time range)    Or  acetaminophen (TYLENOL) suppository 650 mg (has no administration in time range)  0.9 %  sodium chloride infusion ( Intravenous New Bag/Given 06/28/18 0612)  ondansetron (ZOFRAN) tablet 4 mg (has no administration in time range)    Or  ondansetron (ZOFRAN) injection 4 mg (has no administration in time range)  pantoprazole (PROTONIX) injection 40 mg (40 mg Intravenous Given 06/28/18 0926)  0.9 %  sodium chloride infusion (has no administration in time range)  fentaNYL (SUBLIMAZE) 100 MCG/2ML injection (has no administration in time range)  midazolam (VERSED) 5 MG/5ML injection (has no administration in time range)  lidocaine (PF) (XYLOCAINE) 1 % injection (has no administration in time range)  0.9 %  sodium  chloride infusion (10 mL/hr Intravenous New Bag/Given 06/28/18 1530)  midazolam (VERSED) 5 MG/5ML injection (1 mg Intravenous Given 06/28/18 1535)  fentaNYL (SUBLIMAZE) injection (25 mcg Intravenous Given 06/28/18 1536)  tranexamic acid (CYKLOKAPRON) IVPB 1,000 mg (0 mg Intravenous Stopped 06/28/18 0534)  technetium labeled red blood cells (ULTRATAG) injection kit 20 millicurie (17.915 millicuries Intravenous Contrast Given 06/28/18 1210)  iohexol (OMNIPAQUE) 350 MG/ML injection 75 mL (80 mLs Intravenous Contrast Given 06/28/18 1405)    Mobility walks

## 2018-06-28 NOTE — ED Notes (Signed)
Pt being taken by nuclear medicine for bleeding scan

## 2018-06-28 NOTE — Sedation Documentation (Signed)
Amiodarone bolus 150 mg IV bolus given now per orders.

## 2018-06-28 NOTE — ED Triage Notes (Signed)
Pt reports rectal bleeding that started about 3 hours ago; says it's not profusely bleeding; constant pressure in her rectum that is making her feel like she needs to have a bowel movement all time; intermittent abd pain; denies N/V; history of diverticulitis that has required admissions in the past; pt says she doesn't want it to get that bad this time; pt awake and alert, oriented x 3

## 2018-06-28 NOTE — Sedation Documentation (Signed)
0.9 NS reduced to 500 ml/hr.

## 2018-06-28 NOTE — Consult Note (Signed)
Chief Complaint: Patient was seen in consultation today for  Chief Complaint  Patient presents with  . Rectal Bleeding   at the request of Dr. Enid Baas  Referring Physician(s): Enid Baas  Supervising Physician: Oley Balm  Patient Status: [facility] - ED  History of Present Illness: Vanessa Lang is a 85 y.o. female who presented to the ED with lower GI bleed.  She is currently hemodynamically stable.  She has a reported history of partial colectomy.  History of diverticulosis.  Nuclear medicine red blood cell scintigraphy demonstrated active extravasation into the bowel in the left midabdomen, unclear whether this is small bowel versus colon.  CTA confirms active arterial extravasation in the mid descending colon with innumerable colonic diverticuli. Past Medical History:  Diagnosis Date  . Bowel obstruction (HCC)   . CHF (congestive heart failure) (HCC)   . Depression   . Diverticulitis   . Gout   . HLD (hyperlipidemia)   . HTN (hypertension)   . Hypothyroidism     Past Surgical History:  Procedure Laterality Date  . ABDOMINAL HYSTERECTOMY    . ABDOMINAL SURGERY    . HERNIA REPAIR    . PACEMAKER INSERTION    . THYROIDECTOMY      Allergies: Propranolol  Medications: Prior to Admission medications   Medication Sig Start Date End Date Taking? Authorizing Provider  ALPRAZolam Prudy Feeler) 0.25 MG tablet Take 0.25 mg by mouth daily as needed for anxiety.   Yes [provider]  amLODipine (NORVASC) 5 MG tablet Take 1 tablet by mouth daily. 05/02/15  Yes [provider]  aspirin EC 81 MG tablet Take 81 mg by mouth daily.   Yes [provider]  atenolol (TENORMIN) 50 MG tablet Take 1 tablet by mouth daily. 03/22/15  Yes [provider]  donepezil (ARICEPT) 5 MG tablet Take 1 tablet by mouth daily. 11/07/15  Yes [provider]  escitalopram (LEXAPRO) 5 MG tablet Take 5 mg by mouth daily. For 90 days 06/04/18 09/02/18 Yes [provider]  ferrous sulfate 325 (65 FE) MG tablet Take 1 tablet (325 mg total) by mouth 2 (two) times daily with a meal. 05/29/15  Yes [name], MD  gabapentin (NEURONTIN) 100 MG capsule Take 1 capsule by mouth 3 (three) times daily. 03/22/15  Yes [provider]  hydrochlorothiazide (HYDRODIURIL) 25 MG tablet Take 1 tablet by mouth daily. 10/24/15  Yes [provider]  isosorbide mononitrate (IMDUR) 30 MG 24 hr tablet Take 1 tablet by mouth daily. 05/11/15  Yes [provider]  lisinopril (PRINIVIL,ZESTRIL) 40 MG tablet Take 1 tablet by mouth daily. 05/11/15  Yes [provider]  Multiple Vitamin (MULTI-VITAMIN DAILY PO) Take 1 tablet by mouth daily.   Yes [provider]  pantoprazole (PROTONIX) 40 MG tablet Take 40 mg by mouth daily. 04/21/18  Yes [provider]  SYNTHROID 75 MCG tablet Take 1 tablet by mouth daily. 05/22/15  Yes [provider]     Family History  Problem Relation Age of Onset  . Thyroid disease Mother   . Hypertension Sister   . Thyroid disease Sister   . Heart attack Brother   . Cancer Brother   . Colon cancer Sister     Social History   Socioeconomic History  . Marital status: Widowed    Spouse name: Not on file  . Number of children: Not on file  . Years of education: Not on file  . Highest education  level: Not on file  Occupational History  . Not on file  Social Needs  . Financial resource strain: Not on file  . Food insecurity:    Worry: Not on file    Inability: Not on file  . Transportation needs:    Medical: Not on file    Non-medical: Not on file  Tobacco Use  . Smoking status: Former Games developer  . Smokeless tobacco: Never Used  Substance and Sexual Activity  . Alcohol use: No    Alcohol/week: 0.0 standard drinks  . Drug use: No  . Sexual activity: Not on file  Lifestyle  . Physical activity:    Days per week: Not on file    Minutes per session: Not on file  .  Stress: Not on file  Relationships  . Social connections:    Talks on phone: Not on file    Gets together: Not on file    Attends religious service: Not on file    Active member of club or organization: Not on file    Attends meetings of clubs or organizations: Not on file    Relationship status: Not on file  Other Topics Concern  . Not on file  Social History Narrative  . Not on file    ECOG Status: 1 - Symptomatic but completely ambulatory  Review of Systems: A 12 point ROS discussed and pertinent positives are indicated in the HPI above.  All other systems are negative.  Review of Systems  Vital Signs: BP 137/64 (BP Location: Left Arm)   Pulse 83   Temp 97.6 F (36.4 C) (Oral)   Resp 16   Ht  (1.803 m)   Wt 60.2 kg   SpO2 91%   BMI 18.51 kg/m   Physical Exam Constitutional: Oriented to person, place, and time. Well-developed and well-nourished. No distress.  She is attentive and conversant. Last Weight  Most recent update: 06/28/2018  5:28 PM   Weight  60.2 kg (132 lb 11.5 oz)           HENT:  Head: Normocephalic and atraumatic.  Eyes: Conjunctivae and EOM are normal. Right eye exhibits no discharge. Left eye exhibits no discharge. No scleral icterus.  Neck: No JVD present.  Pulmonary/Chest: Effort normal. No stridor. No respiratory distress.  Abdomen: soft, non distended.  Large right inguinal hernia which is soft and reducible Neurological:  alert and oriented to person, place, and time.  Skin: Skin is warm and dry.  not diaphoretic.  Psychiatric:   normal mood and affect.   behavior is normal. Judgment and thought content normal.   Imaging: Nm Gi Blood Loss  Result Date: 06/28/2018 CLINICAL DATA:  GI bleed. Started 1030 last night. Prior colectomy. EXAM: NUCLEAR MEDICINE GASTROINTESTINAL BLEEDING SCAN TECHNIQUE: Sequential abdominal images were obtained following intravenous administration of Tc-50m labeled red blood cells. RADIOPHARMACEUTICALS:   21.459 mCi Tc-45m pertechnetate in-vitro labeled red cells. COMPARISON:  None. FINDINGS: Progressively increasing and peristalsing radiotracer in the left upper abdomen extending into the left lower abdomen. Normal physiologic biodistribution of the radiotracer is demonstrated throughout the abdomen. IMPRESSION: Progressively increasing and peristalsing radiotracer in the left upper abdomen extending into the left lower abdomen. Findings are consistent with active gastrointestinal bleeding in the splenic flexure region versus small bowel. Critical Value/emergent results were called by telephone at the time of interpretation on 06/28/2018 at 1:30 pm to Dr. Jama Flavors , who verbally acknowledged these results. Electronically Signed   By: Elige Ko  On: 06/28/2018 13:30   Ct Angio Abd/pel W/ And/or W/o  Result Date: 06/28/2018 CLINICAL DATA:  Lower GI bleed. Currently hemodynamically stable. History of partial colectomy. Positive red blood cell scintigraphy with evidence of active extravasation in the left mid abdomen, indeterminate whether small bowel versus colon. EXAM: CTA ABDOMEN AND PELVIS wITHOUT AND WITH CONTRAST TECHNIQUE: Multidetector CT imaging of the abdomen and pelvis was performed using the standard protocol during bolus administration of intravenous contrast. Multiplanar reconstructed images and MIPs were obtained and reviewed to evaluate the vascular anatomy. CONTRAST:  33mL OMNIPAQUE IOHEXOL 350 MG/ML SOLN COMPARISON:  08/03/2014 FINDINGS: VASCULAR Coronary calcifications. Aorta: Ectatic up to 3 cm diameter. Moderate calcified plaque. No dissection or stenosis. Celiac: Short-segment origin occlusion or high-grade stenosis, reconstituted distally. SMA: Patent. Classic distal branch anatomy. Renals: Single left, with partially calcified ostial plaque resulting in short segment mild stenosis. Single right, patent. IMA: High-grade origin stenosis, patent but diminutive distally. Inflow: Scattered  calcified plaque through the common and internal iliac arteries. Mild tortuosity of the iliac arterial system without aneurysm or stenosis. Proximal Outflow: Bilateral common femoral and visualized portions of the superficial and profunda femoral arteries are patent without evidence of aneurysm, dissection, vasculitis or significant stenosis. Veins: Patent patent veins, portal vein, SMV, bilateral renal veins. No venous pathology identified on this arterial phase study. Review of the MIP images confirms the above findings. NON-VASCULAR Lower chest: Transvenous pacing leads partially visualized. Coarse aortic leaflet calcifications. No pleural or pericardial effusion. Visualized lung bases clear. Hepatobiliary: No focal liver abnormality is seen. No gallstones, gallbladder wall thickening, or biliary dilatation. Pancreas: Unremarkable. No pancreatic ductal dilatation or surrounding inflammatory changes. Spleen: Normal in size without focal abnormality. Adrenals/Urinary Tract: Unremarkable adrenals. Innumerable bilateral renal lesions many measuring near fluid attenuation consistent with simple cysts, others more hyperdense, others coarsely peripherally calcified. No hydronephrosis. Urinary bladder is distended. Stomach/Bowel: Stomach is decompressed. Small bowel is nondilated. Multiple small bowel loops are involved in a large right inguinal hernia. No evidence of obstruction or strangulation. Anastomotic staple line in small bowel within the hernia sac. The appendix is not discretely identified. Innumerable diverticula throughout the colon. There is ACTIVE ARTERIAL EXTRAVASATION into the mid descending colon, with accumulation of contrast with of the mid descending colon on the delayed scan. Lymphatic: No abdominal or pelvic adenopathy. Reproductive: Status post hysterectomy. No adnexal masses. Other: No ascites. No free air. Musculoskeletal: Multilevel spondylitic changes in the lower lumbar spine with grade 1  anterolisthesis L3-4, L4-5. No fracture or worrisome bone lesion. IMPRESSION: VASCULAR 1. POSITIVE for active arterial extravasation into the mid descending colon, corresponding to findings on recent scintigraphy. 2. Origin occlusion of the celiac axis 3. Coronary and aortoiliac  atherosclerosis (ICD10-170.0). NON-VASCULAR 1. Extensive colonic diverticulosis. 2. Large right inguinal hernia containing multiple small bowel loops without obstruction or strangulation. 3. Multiple bilateral renal lesions, some of which are hyperdense and others are incompletely characterized. Electronically Signed   By: Corlis Leak M.D.   On: 06/28/2018 17:26    Labs:  CBC: Recent Labs    06/28/18 0151 06/28/18 0856 06/28/18 1708  WBC 12.4*  --   --   HGB 11.0* 9.6* 8.0*  HCT 33.8* 29.0* 25.6*  PLT 178  --   --     COAGS: Recent Labs    06/28/18 0514  INR 1.07  APTT 30    BMP: Recent Labs    06/28/18 0151  NA 136  K 3.9  CL 103  CO2 24  GLUCOSE 117*  BUN 36*  CALCIUM 9.2  CREATININE 1.49*  GFRNONAA 30*  GFRAA 35*    LIVER FUNCTION TESTS: Recent Labs    06/28/18 0151  BILITOT 0.4  AST 22  ALT 13  ALKPHOS 67  PROT 7.6  ALBUMIN 4.4    TUMOR MARKERS: No results for input(s): AFPTM, CEA, CA199, CHROMGRNA in the last 8760 hours.  Assessment and Plan:  My impression is that this patient has a mid descending colon active GI bleed, probably diverticular.  She is unprepped and GI is unable to perform urgent colonoscopy and direct intervention per Dr. Tobi Bastos.  Despite her renal insufficiency, we could proceed with mesenteric arteriography and possible embolization if we can localize the bleed and gain access to peripheral feeding branch. I had a discussion with the patient, and her sister over the telephone concurrently, regarding the situation.  We discussed treatment options including watchful waiting, surgical resection of the involved segment, endoscopic cautery, as well as mesenteric  arteriography with possible coil embolization.  We discussed in detail the technique of mesenteric arteriography via femoral access, anticipated benefits, possible risks and complications.  She seemed  to understand and was motivated to proceed.  Accordingly, we will proceed urgently to mesenteric arteriography and possible embolization.  Thank you for this interesting consult.  I greatly enjoyed meeting Vanessa Lang and look forward to participating in their care.  A copy of this report was sent to the requesting provider on this date.  Electronically Signed: Durwin Glaze, MD 06/28/2018, 5:38 PM   I spent a total of 20 Minutes    in face to face in clinical consultation, greater than 50% of which was counseling/coordinating care for acute lower GI bleed.

## 2018-06-28 NOTE — Sedation Documentation (Signed)
0.9 NS increased back to 999 ml/hr. Dopamine at 15 mcg/kilo/hr.

## 2018-06-28 NOTE — ED Notes (Signed)
Notified Dr. Enid Baas of dropping hemoglobin

## 2018-06-28 NOTE — ED Notes (Addendum)
Dr Enid Baas came to see pt, reports Gi will come to see pt but she is not prepped for colonoscopy. They have recommended bleeding scan by nuclear medicine. Dr Enid Baas has contacted nuclear medicine about test. Pt informed of plan of care.  Pt had moderate amount of dark red blood from rectum. Pt cleaned and repositioned

## 2018-06-28 NOTE — Sedation Documentation (Signed)
BP: 74/62 despite max. Dopamine gtt. And NS at 999 ml/hr. MD orders received for Neo IVP.

## 2018-06-28 NOTE — ED Notes (Signed)
Awake and alert.  Transferred to Special Procedures with Special Procedures staff.

## 2018-06-28 NOTE — ED Notes (Signed)
Called special procedures and they will transfer pt to ICU

## 2018-06-28 NOTE — Sedation Documentation (Signed)
Dopamine gtt. Down to 5 mcg/kg/min. NS to 100 ml/hr. Pt. Having runs of VT: able to "cough" herself out of it.

## 2018-06-28 NOTE — ED Notes (Addendum)
Spoke with admitting provider - he has placed order for hgb/hct every 6 hours She is also ordered to have CTA per provider to access anatomy for active GI bleed  Provider gave VO for 2 units of packed red blood cell  Provider will be contacting ICU for step down bed placement

## 2018-06-29 ENCOUNTER — Encounter: Payer: Self-pay | Admitting: Interventional Radiology

## 2018-06-29 LAB — HEMOGLOBIN AND HEMATOCRIT, BLOOD
HCT: 26 % — ABNORMAL LOW (ref 36.0–46.0)
HCT: 23.7 % — ABNORMAL LOW (ref 36.0–46.0)
HEMOGLOBIN: 7.7 g/dL — AB (ref 12.0–15.0)
Hemoglobin: 8.6 g/dL — ABNORMAL LOW (ref 12.0–15.0)

## 2018-06-29 LAB — CBC
HCT: 24.8 % — ABNORMAL LOW (ref 36.0–46.0)
Hemoglobin: 8.2 g/dL — ABNORMAL LOW (ref 12.0–15.0)
MCH: 30.8 pg (ref 26.0–34.0)
MCHC: 33.1 g/dL (ref 30.0–36.0)
MCV: 93.2 fL (ref 80.0–100.0)
PLATELETS: 120 10*3/uL — AB (ref 150–400)
RBC: 2.66 MIL/uL — ABNORMAL LOW (ref 3.87–5.11)
RDW: 15.7 % — ABNORMAL HIGH (ref 11.5–15.5)
WBC: 11.2 10*3/uL — ABNORMAL HIGH (ref 4.0–10.5)
nRBC: 0 % (ref 0.0–0.2)

## 2018-06-29 LAB — BASIC METABOLIC PANEL WITH GFR
Anion gap: 6 (ref 5–15)
BUN: 30 mg/dL — ABNORMAL HIGH (ref 8–23)
CO2: 23 mmol/L (ref 22–32)
Calcium: 8.3 mg/dL — ABNORMAL LOW (ref 8.9–10.3)
Chloride: 111 mmol/L (ref 98–111)
Creatinine, Ser: 1.25 mg/dL — ABNORMAL HIGH (ref 0.44–1.00)
GFR calc Af Amer: 43 mL/min — ABNORMAL LOW
GFR calc non Af Amer: 37 mL/min — ABNORMAL LOW
Glucose, Bld: 88 mg/dL (ref 70–99)
Potassium: 4 mmol/L (ref 3.5–5.1)
Sodium: 140 mmol/L (ref 135–145)

## 2018-06-29 LAB — MAGNESIUM: Magnesium: 1.9 mg/dL (ref 1.7–2.4)

## 2018-06-29 NOTE — Progress Notes (Signed)
No stools overnight.  A&Ox4.  Bilateral groin sights free of complication.  Family slept at bedside throughout shift.  Patient slept well overnight.  VSS.

## 2018-06-29 NOTE — Progress Notes (Signed)
Chief Complaint: Patient was seen today for follow up GI bleed  Supervising Physician: Simonne Come  Patient Status: [facility] - In-pt  Subjective: Pt feeling okay today. S/p mesenteric angiogram yesterday. Source of GI bleeding not found. Incidental (R)CFA branch perf with hematoma, controlled with coil embolization. No additional complications. No further signs of GI bleeding overnight.  She has only had some liquids this morning. Denies CP, SOB, abd pain. Family at bedside  Objective: Physical Exam: BP 107/70   Pulse 65   Temp 98.1 F (36.7 C)   Resp (!) 24   Ht 5\' 11"  (1.803 m)   Wt 60.2 kg   SpO2 99%   BMI 18.51 kg/m  A&O x 3 Sitting up in bed. Abdomen: soft, NT. Large soft, NT right inguinal hernia Ext: (B)groin stick sites soft, NT, no hematoma Feet warm.   Current Facility-Administered Medications:  .  0.9 %  sodium chloride infusion, , Intravenous, Continuous, Arnaldo Natal, MD, Last Rate: 100 mL/hr at 06/29/18 0600 .  0.9 %  sodium chloride infusion, 10 mL/hr, Intravenous, Once, Ojie, Jude, MD .  acetaminophen (TYLENOL) tablet 650 mg, 650 mg, Oral, Q6H PRN **OR** acetaminophen (TYLENOL) suppository 650 mg, 650 mg, Rectal, Q6H PRN, Arnaldo Natal, MD .  levothyroxine (SYNTHROID, LEVOTHROID) tablet 75 mcg, 75 mcg, Oral, Daily, Arnaldo Natal, MD, 75 mcg at 06/29/18 0549 .  ondansetron (ZOFRAN) tablet 4 mg, 4 mg, Oral, Q6H PRN **OR** ondansetron (ZOFRAN) injection 4 mg, 4 mg, Intravenous, Q6H PRN, Arnaldo Natal, MD .  oxyCODONE-acetaminophen (PERCOCET/ROXICET) 5-325 MG per tablet 1 tablet, 1 tablet, Oral, Q4H PRN, Erin Fulling, MD, 1 tablet at 06/28/18 1843 .  pantoprazole (PROTONIX) injection 40 mg, 40 mg, Intravenous, Q12H, Arnaldo Natal, MD, 40 mg at 06/29/18 1117 .  pneumococcal 23 valent vaccine (PNU-IMMUNE) injection 0.5 mL, 0.5 mL, Intramuscular, Tomorrow-1000, Sherrye Payor A, RN  Labs: CBC Recent Labs    06/28/18 0151   06/29/18 0541 06/29/18 1120  WBC 12.4*  --  11.2*  --   HGB 11.0*   < > 8.2* 8.6*  HCT 33.8*   < > 24.8* 26.0*  PLT 178  --  120*  --    < > = values in this interval not displayed.   BMET Recent Labs    06/28/18 0151 06/29/18 0541  NA 136 140  K 3.9 4.0  CL 103 111  CO2 24 23  GLUCOSE 117* 88  BUN 36* 30*  CREATININE 1.49* 1.25*  CALCIUM 9.2 8.3*   LFT Recent Labs    06/28/18 0151  PROT 7.6  ALBUMIN 4.4  AST 22  ALT 13  ALKPHOS 67  BILITOT 0.4   PT/INR Recent Labs    06/28/18 0514  LABPROT 13.8  INR 1.07     Studies/Results: Nm Gi Blood Loss  Result Date: 06/28/2018 CLINICAL DATA:  GI bleed. Started 1030 last night. Prior colectomy. EXAM: NUCLEAR MEDICINE GASTROINTESTINAL BLEEDING SCAN TECHNIQUE: Sequential abdominal images were obtained following intravenous administration of Tc-92m labeled red blood cells. RADIOPHARMACEUTICALS:  21.459 mCi Tc-20m pertechnetate in-vitro labeled red cells. COMPARISON:  None. FINDINGS: Progressively increasing and peristalsing radiotracer in the left upper abdomen extending into the left lower abdomen. Normal physiologic biodistribution of the radiotracer is demonstrated throughout the abdomen. IMPRESSION: Progressively increasing and peristalsing radiotracer in the left upper abdomen extending into the left lower abdomen. Findings are consistent with active gastrointestinal bleeding in the splenic flexure region versus small bowel. Critical Value/emergent results  were called by telephone at the time of interpretation on 06/28/2018 at 1:30 pm to Dr. Jama Flavors , who verbally acknowledged these results. Electronically Signed   By: Elige Ko   On: 06/28/2018 13:30   Ct Angio Abd/pel W/ And/or W/o  Result Date: 06/28/2018 CLINICAL DATA:  Lower GI bleed. Currently hemodynamically stable. History of partial colectomy. Positive red blood cell scintigraphy with evidence of active extravasation in the left mid abdomen, indeterminate whether  small bowel versus colon. EXAM: CTA ABDOMEN AND PELVIS wITHOUT AND WITH CONTRAST TECHNIQUE: Multidetector CT imaging of the abdomen and pelvis was performed using the standard protocol during bolus administration of intravenous contrast. Multiplanar reconstructed images and MIPs were obtained and reviewed to evaluate the vascular anatomy. CONTRAST:  19mL OMNIPAQUE IOHEXOL 350 MG/ML SOLN COMPARISON:  08/03/2014 FINDINGS: VASCULAR Coronary calcifications. Aorta: Ectatic up to 3 cm diameter. Moderate calcified plaque. No dissection or stenosis. Celiac: Short-segment origin occlusion or high-grade stenosis, reconstituted distally. SMA: Patent. Classic distal branch anatomy. Renals: Single left, with partially calcified ostial plaque resulting in short segment mild stenosis. Single right, patent. IMA: High-grade origin stenosis, patent but diminutive distally. Inflow: Scattered calcified plaque through the common and internal iliac arteries. Mild tortuosity of the iliac arterial system without aneurysm or stenosis. Proximal Outflow: Bilateral common femoral and visualized portions of the superficial and profunda femoral arteries are patent without evidence of aneurysm, dissection, vasculitis or significant stenosis. Veins: Patent patent veins, portal vein, SMV, bilateral renal veins. No venous pathology identified on this arterial phase study. Review of the MIP images confirms the above findings. NON-VASCULAR Lower chest: Transvenous pacing leads partially visualized. Coarse aortic leaflet calcifications. No pleural or pericardial effusion. Visualized lung bases clear. Hepatobiliary: No focal liver abnormality is seen. No gallstones, gallbladder wall thickening, or biliary dilatation. Pancreas: Unremarkable. No pancreatic ductal dilatation or surrounding inflammatory changes. Spleen: Normal in size without focal abnormality. Adrenals/Urinary Tract: Unremarkable adrenals. Innumerable bilateral renal lesions many measuring  near fluid attenuation consistent with simple cysts, others more hyperdense, others coarsely peripherally calcified. No hydronephrosis. Urinary bladder is distended. Stomach/Bowel: Stomach is decompressed. Small bowel is nondilated. Multiple small bowel loops are involved in a large right inguinal hernia. No evidence of obstruction or strangulation. Anastomotic staple line in small bowel within the hernia sac. The appendix is not discretely identified. Innumerable diverticula throughout the colon. There is ACTIVE ARTERIAL EXTRAVASATION into the mid descending colon, with accumulation of contrast with of the mid descending colon on the delayed scan. Lymphatic: No abdominal or pelvic adenopathy. Reproductive: Status post hysterectomy. No adnexal masses. Other: No ascites. No free air. Musculoskeletal: Multilevel spondylitic changes in the lower lumbar spine with grade 1 anterolisthesis L3-4, L4-5. No fracture or worrisome bone lesion. IMPRESSION: VASCULAR 1. POSITIVE for active arterial extravasation into the mid descending colon, corresponding to findings on recent scintigraphy. 2. Origin occlusion of the celiac axis 3. Coronary and aortoiliac  atherosclerosis (ICD10-170.0). NON-VASCULAR 1. Extensive colonic diverticulosis. 2. Large right inguinal hernia containing multiple small bowel loops without obstruction or strangulation. 3. Multiple bilateral renal lesions, some of which are hyperdense and others are incompletely characterized. Electronically Signed   By: Corlis Leak M.D.   On: 06/28/2018 17:26    Assessment/Plan: Lower GI bleed seen on NM RBC scan and CTA S/p mesenteric angiogram, unable to find source vessel. No apparent further bleeding overnight. Hgb stable at 8.2 If signs of further rebleed, would recommend GI consult for consideration of colonoscopy/direct visualization before offering repeat angiogram.  LOS: 1 day   I spent a total of 20 minutes in face to face in clinical consultation,  greater than 50% of which was counseling/coordinating care for GI bleed s/p mesenteric angiogram  Brayton El PA-C 06/29/2018 11:48 AM

## 2018-06-29 NOTE — Progress Notes (Signed)
Dr Belia Heman reviewed EKG. At this time no new orders. Patient is not complaining of any chest pain or shortness of breath.

## 2018-06-29 NOTE — Progress Notes (Signed)
Patient having EKG changes. ST elevation/junctional. 12 Lead ordered

## 2018-06-29 NOTE — Progress Notes (Signed)
[facility] -  at [facility]   PATIENT NAME: Wilton Ansara    MR#:  [id number]  DATE OF BIRTH:  June 28, 1922  SUBJECTIVE:  CHIEF COMPLAINT:   Chief Complaint  Patient presents with  . Rectal Bleeding   Patient initially admitted to the ICU due to ongoing active GI bleed.  Seen by interventional radiologist and had angiogram done but unable to find the source vessel.  No further bleeding overnight.  Hemoglobin stable at 8.6 this morning.  Started on clear liquids this morning. Was transfused with at least 2 units of packed red blood cells yesterday.  REVIEW OF SYSTEMS:  Review of Systems  Constitutional: Negative for chills and fever.  HENT: Negative for hearing loss and tinnitus.   Eyes: Negative for blurred vision and double vision.  Respiratory: Negative for cough, hemoptysis and sputum production.   Cardiovascular: Negative for chest pain and palpitations.  Gastrointestinal: Negative for abdominal pain, heartburn and nausea.       Ongoing rectal bleed  Genitourinary: Negative for dysuria and urgency.  Musculoskeletal: Negative for myalgias and neck pain.  Skin: Negative for itching and rash.  Neurological: Negative for dizziness and headaches.  Psychiatric/Behavioral: Negative for depression and hallucinations.      DRUG ALLERGIES:   Allergies  Allergen Reactions  . Propranolol Rash   VITALS:  Blood pressure (!) 138/105, pulse 68, temperature 98.1 F (36.7 C), resp. rate 16, height 5\' 11"  (1.803 m), weight 60.2 kg, SpO2 98 %. PHYSICAL EXAMINATION:  Physical Exam  Constitutional: She is oriented to person, place, and time. She appears well-developed and well-nourished.  HENT:  Head: Normocephalic and atraumatic.  Eyes: Pupils are equal, round, and reactive to light. Conjunctivae and EOM are normal. Right eye exhibits no discharge.  Neck: Normal range of motion. Neck supple. No tracheal deviation present.  Cardiovascular: Normal rate, regular  rhythm and normal heart sounds.  Respiratory: Effort normal and breath sounds normal. She has no wheezes.  GI: Soft. Bowel sounds are normal. There is no abdominal tenderness.  Musculoskeletal: Normal range of motion.        General: No edema.  Neurological: She is alert and oriented to person, place, and time. No cranial nerve deficit.  Skin: Skin is warm. She is not diaphoretic. No erythema.  Psychiatric: She has a normal mood and affect. Her behavior is normal.    LABORATORY PANEL:  Female CBC Recent Labs  Lab 06/29/18 0541 06/29/18 1120  WBC 11.2*  --   HGB 8.2* 8.6*  HCT 24.8* 26.0*  PLT 120*  --    ------------------------------------------------------------------------------------------------------------------ Chemistries  Recent Labs  Lab 06/28/18 0151 06/29/18 0541  NA 136 140  K 3.9 4.0  CL 103 111  CO2 24 23  GLUCOSE 117* 88  BUN 36* 30*  CREATININE 1.49* 1.25*  CALCIUM 9.2 8.3*  MG  --  1.9  AST 22  --   ALT 13  --   ALKPHOS 67  --   BILITOT 0.4  --    RADIOLOGY:  Ir Angiogram Visceral Selective  Result Date: 06/29/2018 CLINICAL DATA:  GI bleed, localized to mid descending segment on CTA. EXAM: SELECTIVE VISCERAL ARTERIOGRAPHY PROCEDURE: The procedure, risks (including but not limited to bleeding, infection, organ damage ), benefits, and alternatives were explained to the patient, and sister on the phone. Questions regarding the procedure were encouraged and answered. The patient understands and consents to the procedure. Bilateral femoral regions prepped and draped in usual sterile fashion. Maximal  barrier sterile technique was utilized including caps, mask, sterile gowns, sterile gloves, sterile drape, hand hygiene and skin antiseptic. The right common femoral artery was localized under ultrasound. Under real-time ultrasound guidance, with medial displacement of her inguinal hernia, the vessel was accessed with a 21-gauge micropuncture needle from a lateral  approach. A 018 guidewire advanced easily. Needle was exchanged for a transitional dilator, and with advancement some resistance was encountered. The guidewire and inner dilator were removed and small contrast injection under fluoroscopy demonstrated extraluminal contrast in the extraperitoneal space lateral to the urinary bladder. The dilator was withdrawn until the tip was in the lumen of the common femoral artery. Injection showed continued extravasation from a small muscular branch medial to the distal common femoral artery. Contralateral access was required to manage the right common femoral branch extravasation. Therefore, the left common femoral artery was localized under ultrasound, patency confirmed and documented. Under real-time ultrasound guidance, the vessel was accessed with a 21-gauge micropuncture needle, exchanged for a transitional dilator. Through this, a Bentson wire was advanced centrally to the aorta. Over this, a 5 French sheath was placed, through which the 5 [place] RIM catheter was advanced across the aortic bifurcation for right pelvic arteriography. This demonstrated continued extravasation from the common femoral branch. The catheter and sheath were removed over an angled stiff glidewire exchanged for a 7 French sheath, through which 7 [place] Fogarty occlusion balloon was advanced over the wire to the distal external iliac artery in the balloon gently inflated until occlusive. A coaxial microcatheter was advanced through the Fogarty catheter and with the aid of a 016 guidewire the arterial branch of concern was catheterized, and a 7 mm Ruby coil placed at its origin. Injection through the occlusion balloon catheter showed no further extravasation. The balloon was deflated. Arteriogram through the catheter showed good flow distally through the right common femoral artery, no further extravasation, good position of the coil. The Fogarty catheter was exchanged over guidewire for a rim  catheter, and attempts to engage the origin of the IMA were attempted but ultimately unsuccessful. For this reason, the rim was exchanged for a 5 French pigtail catheter for oblique distal aortography demonstrating apparent occlusion and nonvisualization of the inferior mesenteric artery. The pigtail catheter was then exchanged for a C2 catheter, used to selectively catheterize the superior mesenteric artery for selective arteriography in multiple projections. As no extravasation was identified, attention was then turned back to the expected region of the inferior mesenteric artery. The 5 French pigtail was placed again in the distal abdominal aorta for flush AP aortography. Still the inferior mesenteric artery could not be localized. Given the patient's renal insufficiency , known distal site of the bleed on recent CTA, and apparent celiac origin occlusion on recent CTA, celiac arteriography was not attempted. The catheter and sheath were removed and hemostasis achieved with the aid of the StarClose device after confirmatory femoral arteriography. The patient tolerated the procedure well. COMPLICATIONS: Extravasation of from muscular branch of the right common femoral artery, successfully treated with endovascular coil embolization. SIR level B: Nominal therapy (including overnight admission for observation), no consequence. ANESTHESIA/SEDATION: Intravenous Fentanyl and Versed were administered as conscious sedation during continuous cardiorespiratory monitoring by the radiology RN, with a total moderate sedation time of 56 minutes. MEDICATIONS: Lidocaine 1% subcutaneous CONTRAST:  85mL ISOVUE-300 IOPAMIDOL (ISOVUE-300) INJECTION 61% FINDINGS: The active extravasation seen in the mid descending colon on recent CTA was not identified. 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 No other mesenteric  vascular abnormality identified. Access site complication with resultant extravasation from a muscular branch of  the right common femoral artery was identified and controlled with coil embolization. IMPRESSION: 1. Negative mesenteric arteriogram. No evidence of active extravasation. 2. 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 Electronically Signed   By: Corlis Leak M.D.   On: 06/29/2018 12:10   ASSESSMENT AND PLAN:    1.  GI bleed: Patient presented with melena with some intermittent hematochezia.  Has had multiple maroon-colored bleeding on presentation. I discussed case with gastroenterologist on-call initially who recommended bleeding scan since patient not prepped for colonoscopy. Bleeding scan came back positive.  Discussed case with interventional radiologist Dr. Deanne Coffer and patient subsequently had mesenteric angiogram done.  Bleeding vessel was not found.   No further bleeding overnight.  Hemoglobin stable at 8.6 status post 2 units of packed red blood cells yesterday.   Started on clear liquid diet today.   Transferred out of ICU today.   If patient has repeat bleeding, to consult gastroenterology for consideration of colonoscopy and direct visualization before repeat angiogram.  2.  Hypertension: Controlled; hold blood pressure medications for now  3.  Hypothyroidism: TSH normal.  Continue Synthroid  4.  CHF: Chronic; diastolic.  Stable.   DVT prophylaxis: SCDs No heparin products due to GI bleed  All the records are reviewed and case discussed with Care Management/Social Worker. Management plans discussed with the patient, family and they are in agreement.  CODE STATUS: Full Code  TOTAL TIME TAKING CARE OF THIS PATIENT: .   More than 50% of the time was spent in counseling/coordination of care: YES  POSSIBLE D/C IN 2DAYS, DEPENDING ON CLINICAL CONDITION.   Obryan Radu M.D on 06/29/2018 at 3:35 PM  Between 7am to 6pm - Pager - [phone]  After 6pm go to [email or web address] - Social research officer, government  [facility]  Office  [phone]  CC:  Primary care physician; Dorothey Baseman, MD  Note: This dictation was prepared with Dragon dictation along with smaller phrase technology. Any transcriptional errors that result from this process are unintentional.

## 2018-06-30 ENCOUNTER — Inpatient Hospital Stay: Payer: Medicare Other

## 2018-06-30 LAB — BASIC METABOLIC PANEL
Anion gap: 7 (ref 5–15)
BUN: 20 mg/dL (ref 8–23)
CO2: 22 mmol/L (ref 22–32)
Calcium: 8.3 mg/dL — ABNORMAL LOW (ref 8.9–10.3)
Chloride: 114 mmol/L — ABNORMAL HIGH (ref 98–111)
Creatinine, Ser: 1.25 mg/dL — ABNORMAL HIGH (ref 0.44–1.00)
GFR calc non Af Amer: 37 mL/min — ABNORMAL LOW (ref >60)
GFR, EST AFRICAN AMERICAN: 43 mL/min — AB (ref >60)
Glucose, Bld: 81 mg/dL (ref 70–99)
POTASSIUM: 3.8 mmol/L (ref 3.5–5.1)
Sodium: 143 mmol/L (ref 135–145)

## 2018-06-30 LAB — CBC
HCT: 23 % — ABNORMAL LOW (ref 36.0–46.0)
Hemoglobin: 7.6 g/dL — ABNORMAL LOW (ref 12.0–15.0)
MCH: 30.9 pg (ref 26.0–34.0)
MCHC: 33 g/dL (ref 30.0–36.0)
MCV: 93.5 fL (ref 80.0–100.0)
NRBC: 0 % (ref 0.0–0.2)
Platelets: 118 10*3/uL — ABNORMAL LOW (ref 150–400)
RBC: 2.46 MIL/uL — AB (ref 3.87–5.11)
RDW: 15 % (ref 11.5–15.5)
WBC: 9.6 10*3/uL (ref 4.0–10.5)

## 2018-06-30 LAB — MAGNESIUM: MAGNESIUM: 1.7 mg/dL (ref 1.7–2.4)

## 2018-06-30 NOTE — Consult Note (Signed)
[facility] Cardiology Consultation Note  Patient ID: Vanessa Lang, MRN: [id number], DOB/AGE: January 02, 1923 85 y.o. Admit date: 06/28/2018   Date of Consult: 06/30/2018 Primary Physician: Dorothey Baseman, MD Primary Cardiologist: Paraschos  Chief Complaint:  Chief Complaint  Patient presents with  . Rectal Bleeding   Reason for Consult: GI bleed with aortic stenosis  HPI: 85 y.o. female with known chronic kidney disease stage III symptomatic bradycardia and heart block status post dual-chamber pacemaker placement and moderate aortic valve stenosis with normal LV systolic function with ejection fraction of 50% in 2018.  The patient has had a new onset of significant GI bleeding for which he has stopped at this time with a current hemoglobin of 7.6.  She is currently hemodynamically stable without evidence of significant congestive heart failure and/or angina.  The patient has had echocardiogram in 2018 showing moderate aortic valve stenosis and ejection fraction of 50% and currently has had no evidence of heart failure or angina due to this severe stress with anemia and GI bleed.  The patient's pacemaker based on EKG shows dual-chamber pacemaking working well with occasional preventricular contractions.  The patient currently is at lowest risk possible for further intervention from GI bleed including colonoscopy or other treatment options  Past Medical History:  Diagnosis Date  . Bowel obstruction (HCC)   . CHF (congestive heart failure) (HCC)   . Depression   . Diverticulitis   . Gout   . HLD (hyperlipidemia)   . HTN (hypertension)   . Hypothyroidism       Surgical History:  Past Surgical History:  Procedure Laterality Date  . ABDOMINAL HYSTERECTOMY    . ABDOMINAL SURGERY    . HERNIA REPAIR    . IR ANGIOGRAM VISCERAL SELECTIVE  06/28/2018  . PACEMAKER INSERTION    . THYROIDECTOMY       Home Meds: Prior to Admission medications   Medication Sig Start Date End Date Taking?  Authorizing Provider  ALPRAZolam Prudy Feeler) 0.25 MG tablet Take 0.25 mg by mouth daily as needed for anxiety.   Yes [provider]  amLODipine (NORVASC) 5 MG tablet Take 1 tablet by mouth daily. 05/02/15  Yes [provider]  aspirin EC 81 MG tablet Take 81 mg by mouth daily.   Yes [provider]  atenolol (TENORMIN) 50 MG tablet Take 1 tablet by mouth daily. 03/22/15  Yes [provider]  donepezil (ARICEPT) 5 MG tablet Take 1 tablet by mouth daily. 11/07/15  Yes [provider]  escitalopram (LEXAPRO) 5 MG tablet Take 5 mg by mouth daily. For 90 days 06/04/18 09/02/18 Yes [provider]  ferrous sulfate 325 (65 FE) MG tablet Take 1 tablet (325 mg total) by mouth 2 (two) times daily with a meal. 05/29/15  Yes [name], MD  gabapentin (NEURONTIN) 100 MG capsule Take 1 capsule by mouth 3 (three) times daily. 03/22/15  Yes [provider]  hydrochlorothiazide (HYDRODIURIL) 25 MG tablet Take 1 tablet by mouth daily. 10/24/15  Yes [provider]  isosorbide mononitrate (IMDUR) 30 MG 24 hr tablet Take 1 tablet by mouth daily. 05/11/15  Yes [provider]  lisinopril (PRINIVIL,ZESTRIL) 40 MG tablet Take 1 tablet by mouth daily. 05/11/15  Yes [provider]  Multiple Vitamin (MULTI-VITAMIN DAILY PO) Take 1 tablet by mouth daily.   Yes [provider]  pantoprazole (PROTONIX) 40 MG tablet Take 40 mg by mouth daily. 04/21/18  Yes [provider]  SYNTHROID 75 MCG tablet Take  1 tablet by mouth daily. 05/22/15  Yes [provider]    Inpatient Medications:  . levothyroxine  75 mcg Oral Daily  . pantoprazole (PROTONIX) IV  40 mg Intravenous Q12H     Allergies:  Allergies  Allergen Reactions  . Propranolol Rash    Social History   Socioeconomic History  . Marital status: Widowed    Spouse name: Not on file  . Number of children: Not on file  . Years of education: Not on  file  . Highest education level: Not on file  Occupational History  . Not on file  Social Needs  . Financial resource strain: Not on file  . Food insecurity:    Worry: Not on file    Inability: Not on file  . Transportation needs:    Medical: Not on file    Non-medical: Not on file  Tobacco Use  . Smoking status: Former Games developer  . Smokeless tobacco: Never Used  Substance and Sexual Activity  . Alcohol use: No    Alcohol/week: 0.0 standard drinks  . Drug use: No  . Sexual activity: Not on file  Lifestyle  . Physical activity:    Days per week: Not on file    Minutes per session: Not on file  . Stress: Not on file  Relationships  . Social connections:    Talks on phone: Not on file    Gets together: Not on file    Attends religious service: Not on file    Active member of club or organization: Not on file    Attends meetings of clubs or organizations: Not on file    Relationship status: Not on file  . Intimate partner violence:    Fear of current or ex partner: Not on file    Emotionally abused: Not on file    Physically abused: Not on file    Forced sexual activity: Not on file  Other Topics Concern  . Not on file  Social History Narrative  . Not on file     Family History  Problem Relation Age of Onset  . Thyroid disease Mother   . Hypertension Sister   . Thyroid disease Sister   . Heart attack Brother   . Cancer Brother   . Colon cancer Sister      Review of Systems Positive for GI bleed Negative for: General:  chills, fever, night sweats or weight changes.  Cardiovascular: PND orthopnea syncope dizziness  Dermatological skin lesions rashes Respiratory: Cough congestion Urologic: Frequent urination urination at night and hematuria Abdominal: negative for nausea, vomiting, diarrhea,   or hematemesis Neurologic: negative for visual changes, and/or hearing changes  All other systems reviewed and are otherwise negative except as noted above.  Labs: No  results for input(s): CKTOTAL, CKMB, TROPONINI in the last 72 hours. Lab Results  Component Value Date   WBC 9.6 06/30/2018   HGB 7.6 (L) 06/30/2018   HCT 23.0 (L) 06/30/2018   MCV 93.5 06/30/2018   PLT 118 (L) 06/30/2018    Recent Labs  Lab 06/28/18 0151  06/30/18 0614  NA 136   < > 143  K 3.9   < > 3.8  CL 103   < > 114*  CO2 24   < > 22  BUN 36*   < > 20  CREATININE 1.49*   < > 1.25*  CALCIUM 9.2   < > 8.3*  PROT 7.6  --   --   BILITOT 0.4  --   --  ALKPHOS 67  --   --   ALT 13  --   --   AST 22  --   --   GLUCOSE 117*   < > 81   < > = values in this interval not displayed.   No results found for: CHOL, HDL, LDLCALC, TRIG No results found for: DDIMER  Radiology/Studies:  Ct Abdomen Pelvis Wo Contrast  Result Date: 06/30/2018 CLINICAL DATA:  Status post recent angiogram with groin hematoma and drop in hemoglobin. EXAM: CT ABDOMEN AND PELVIS WITHOUT CONTRAST TECHNIQUE: Multidetector CT imaging of the abdomen and pelvis was performed following the standard protocol without IV contrast. COMPARISON:  06/28/2018 FINDINGS: Lower chest: Small left pleural effusion. Minimal overlying atelectasis. No worrisome pulmonary lesions. Stable mild cardiac enlargement and vascular calcifications. Hepatobiliary: No focal hepatic lesion or intrahepatic biliary dilatation. The gallbladder contains contrast likely due to vicarious excretion. Pancreas: No mass, inflammation or ductal dilatation. Spleen: Normal size.  No focal lesions. Adrenals/Urinary Tract: Stable nodularity of both adrenal glands. No discrete mass. Possible nodular hyperplasia or small adenomas. Again demonstrated are numerous bilateral simple and complex renal cysts. Several of these demonstrate dense calcifications and some cysts are hyperdense/hemorrhagic. I do not see any worrisome lesions without contrast and I do not see any significant change since prior CT scan from 2016. No hydronephrosis. The bladder contains contrast and  appears normal. No asymmetric bladder wall thickening or mass. Stomach/Bowel: Stable large right inguinal hernia containing numerous small bowel loops and evidence of surgical anastomosis. The colon demonstrates extensive diverticulosis but no definite findings for acute diverticulitis. Vascular/Lymphatic: Extensive atherosclerotic calcifications involving the aorta and iliac arteries. Stable branch vessel calcifications also. No mesenteric or retroperitoneal mass or adenopathy. Reproductive: Surgically absent. Other: There is a small right groin hematoma contiguous with a small/moderate sized right-sided extraperitoneal pelvic hematoma. No mass effect on the bladder appeared presacral edema/fluid is also noted. Musculoskeletal: Stable degenerative changes involving the spine and hips. No acute bony findings or destructive bony lesions. IMPRESSION: 1. Small right groin hematoma contiguous with a small to moderate extraperitoneal right-sided pelvic hematoma. 2. Stable large right inguinal hernia containing numerous small bowel loops but no findings for obstruction or incarceration. 3. Numerous complex renal cysts bilaterally but no obvious worrisome lesion. 4. Diffuse colonic diverticulosis but no findings for acute diverticulitis. 5. Advanced atherosclerotic calcifications involving the aorta, iliac arteries and branch vessels. 6. Small left pleural effusion. Electronically Signed   By: Rudie Meyer M.D.   On: 06/30/2018 11:22   Nm Gi Blood Loss  Result Date: 06/28/2018 CLINICAL DATA:  GI bleed. Started 1030 last night. Prior colectomy. EXAM: NUCLEAR MEDICINE GASTROINTESTINAL BLEEDING SCAN TECHNIQUE: Sequential abdominal images were obtained following intravenous administration of Tc-49m labeled red blood cells. RADIOPHARMACEUTICALS:  21.459 mCi Tc-58m pertechnetate in-vitro labeled red cells. COMPARISON:  None. FINDINGS: Progressively increasing and peristalsing radiotracer in the left upper abdomen extending  into the left lower abdomen. Normal physiologic biodistribution of the radiotracer is demonstrated throughout the abdomen. IMPRESSION: Progressively increasing and peristalsing radiotracer in the left upper abdomen extending into the left lower abdomen. Findings are consistent with active gastrointestinal bleeding in the splenic flexure region versus small bowel. Critical Value/emergent results were called by telephone at the time of interpretation on 06/28/2018 at 1:30 pm to Dr. Jama Flavors , who verbally acknowledged these results. Electronically Signed   By: Elige Ko   On: 06/28/2018 13:30   Ir Angiogram Visceral Selective  Result Date: 06/29/2018 CLINICAL DATA:  GI  bleed, localized to mid descending segment on CTA. EXAM: SELECTIVE VISCERAL ARTERIOGRAPHY PROCEDURE: The procedure, risks (including but not limited to bleeding, infection, organ damage ), benefits, and alternatives were explained to the patient, and sister on the phone. Questions regarding the procedure were encouraged and answered. The patient understands and consents to the procedure. Bilateral femoral regions prepped and draped in usual sterile fashion. Maximal barrier sterile technique was utilized including caps, mask, sterile gowns, sterile gloves, sterile drape, hand hygiene and skin antiseptic. The right common femoral artery was localized under ultrasound. Under real-time ultrasound guidance, with medial displacement of her inguinal hernia, the vessel was accessed with a 21-gauge micropuncture needle from a lateral approach. A 018 guidewire advanced easily. Needle was exchanged for a transitional dilator, and with advancement some resistance was encountered. The guidewire and inner dilator were removed and small contrast injection under fluoroscopy demonstrated extraluminal contrast in the extraperitoneal space lateral to the urinary bladder. The dilator was withdrawn until the tip was in the lumen of the common femoral artery.  Injection showed continued extravasation from a small muscular branch medial to the distal common femoral artery. Contralateral access was required to manage the right common femoral branch extravasation. Therefore, the left common femoral artery was localized under ultrasound, patency confirmed and documented. Under real-time ultrasound guidance, the vessel was accessed with a 21-gauge micropuncture needle, exchanged for a transitional dilator. Through this, a Bentson wire was advanced centrally to the aorta. Over this, a 5 French sheath was placed, through which the 5 [place] RIM catheter was advanced across the aortic bifurcation for right pelvic arteriography. This demonstrated continued extravasation from the common femoral branch. The catheter and sheath were removed over an angled stiff glidewire exchanged for a 7 French sheath, through which 7 [place] Fogarty occlusion balloon was advanced over the wire to the distal external iliac artery in the balloon gently inflated until occlusive. A coaxial microcatheter was advanced through the Fogarty catheter and with the aid of a 016 guidewire the arterial branch of concern was catheterized, and a 7 mm Ruby coil placed at its origin. Injection through the occlusion balloon catheter showed no further extravasation. The balloon was deflated. Arteriogram through the catheter showed good flow distally through the right common femoral artery, no further extravasation, good position of the coil. The Fogarty catheter was exchanged over guidewire for a rim catheter, and attempts to engage the origin of the IMA were attempted but ultimately unsuccessful. For this reason, the rim was exchanged for a 5 French pigtail catheter for oblique distal aortography demonstrating apparent occlusion and nonvisualization of the inferior mesenteric artery. The pigtail catheter was then exchanged for a C2 catheter, used to selectively catheterize the superior mesenteric artery for selective  arteriography in multiple projections. As no extravasation was identified, attention was then turned back to the expected region of the inferior mesenteric artery. The 5 French pigtail was placed again in the distal abdominal aorta for flush AP aortography. Still the inferior mesenteric artery could not be localized. Given the patient's renal insufficiency , known distal site of the bleed on recent CTA, and apparent celiac origin occlusion on recent CTA, celiac arteriography was not attempted. The catheter and sheath were removed and hemostasis achieved with the aid of the StarClose device after confirmatory femoral arteriography. The patient tolerated the procedure well. COMPLICATIONS: Extravasation of from muscular branch of the right common femoral artery, successfully treated with endovascular coil embolization. SIR level B: Nominal therapy (including overnight admission for observation), no  consequence. ANESTHESIA/SEDATION: Intravenous Fentanyl and Versed were administered as conscious sedation during continuous cardiorespiratory monitoring by the radiology RN, with a total moderate sedation time of 56 minutes. MEDICATIONS: Lidocaine 1% subcutaneous CONTRAST:  85mL ISOVUE-300 IOPAMIDOL (ISOVUE-300) INJECTION 61% FINDINGS: The active extravasation seen in the mid descending colon on recent CTA was not identified. 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 No other mesenteric vascular abnormality identified. Access site complication with resultant extravasation from a muscular branch of the right common femoral artery was identified and controlled with coil embolization. IMPRESSION: 1. Negative mesenteric arteriogram. No evidence of active extravasation. 2. 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 Electronically Signed   By: Corlis Leak M.D.   On: 06/29/2018 12:10   Ct Angio Abd/pel W/ And/or W/o  Result Date: 06/28/2018 CLINICAL DATA:  Lower GI bleed. Currently hemodynamically stable.  History of partial colectomy. Positive red blood cell scintigraphy with evidence of active extravasation in the left mid abdomen, indeterminate whether small bowel versus colon. EXAM: CTA ABDOMEN AND PELVIS wITHOUT AND WITH CONTRAST TECHNIQUE: Multidetector CT imaging of the abdomen and pelvis was performed using the standard protocol during bolus administration of intravenous contrast. Multiplanar reconstructed images and MIPs were obtained and reviewed to evaluate the vascular anatomy. CONTRAST:  80mL OMNIPAQUE IOHEXOL 350 MG/ML SOLN COMPARISON:  08/03/2014 FINDINGS: VASCULAR Coronary calcifications. Aorta: Ectatic up to 3 cm diameter. Moderate calcified plaque. No dissection or stenosis. Celiac: Short-segment origin occlusion or high-grade stenosis, reconstituted distally. SMA: Patent. Classic distal branch anatomy. Renals: Single left, with partially calcified ostial plaque resulting in short segment mild stenosis. Single right, patent. IMA: High-grade origin stenosis, patent but diminutive distally. Inflow: Scattered calcified plaque through the common and internal iliac arteries. Mild tortuosity of the iliac arterial system without aneurysm or stenosis. Proximal Outflow: Bilateral common femoral and visualized portions of the superficial and profunda femoral arteries are patent without evidence of aneurysm, dissection, vasculitis or significant stenosis. Veins: Patent patent veins, portal vein, SMV, bilateral renal veins. No venous pathology identified on this arterial phase study. Review of the MIP images confirms the above findings. NON-VASCULAR Lower chest: Transvenous pacing leads partially visualized. Coarse aortic leaflet calcifications. No pleural or pericardial effusion. Visualized lung bases clear. Hepatobiliary: No focal liver abnormality is seen. No gallstones, gallbladder wall thickening, or biliary dilatation. Pancreas: Unremarkable. No pancreatic ductal dilatation or surrounding inflammatory  changes. Spleen: Normal in size without focal abnormality. Adrenals/Urinary Tract: Unremarkable adrenals. Innumerable bilateral renal lesions many measuring near fluid attenuation consistent with simple cysts, others more hyperdense, others coarsely peripherally calcified. No hydronephrosis. Urinary bladder is distended. Stomach/Bowel: Stomach is decompressed. Small bowel is nondilated. Multiple small bowel loops are involved in a large right inguinal hernia. No evidence of obstruction or strangulation. Anastomotic staple line in small bowel within the hernia sac. The appendix is not discretely identified. Innumerable diverticula throughout the colon. There is ACTIVE ARTERIAL EXTRAVASATION into the mid descending colon, with accumulation of contrast with of the mid descending colon on the delayed scan. Lymphatic: No abdominal or pelvic adenopathy. Reproductive: Status post hysterectomy. No adnexal masses. Other: No ascites. No free air. Musculoskeletal: Multilevel spondylitic changes in the lower lumbar spine with grade 1 anterolisthesis L3-4, L4-5. No fracture or worrisome bone lesion. IMPRESSION: VASCULAR 1. POSITIVE for active arterial extravasation into the mid descending colon, corresponding to findings on recent scintigraphy. 2. Origin occlusion of the celiac axis 3. Coronary and aortoiliac  atherosclerosis (ICD10-170.0). NON-VASCULAR 1. Extensive colonic diverticulosis. 2. Large right inguinal hernia containing multiple  small bowel loops without obstruction or strangulation. 3. Multiple bilateral renal lesions, some of which are hyperdense and others are incompletely characterized. Electronically Signed   By: Corlis Leak M.D.   On: 06/28/2018 17:26    EKG: AV paced rhythm with preventricular contractions  Weights: Filed Weights   06/28/18 1700 06/28/18 1728 06/30/18 0054  Weight: 62.8 kg 60.2 kg 62.5 kg     Physical Exam: Blood pressure (!) 149/58, pulse 65, temperature 98.3 F (36.8 C),  temperature source Oral, resp. rate (!) 24, height 5\' 11"  (1.803 m), weight 62.5 kg, SpO2 98 %. Body mass index is 19.22 kg/m. General: Well developed, well nourished, in no acute distress. Head eyes ears nose throat: Normocephalic, atraumatic, sclera non-icteric, no xanthomas, nares are without discharge. No apparent thyromegaly and/or mass  Lungs: Normal respiratory effort.  no wheezes, no rales, no rhonchi.  Heart: RRR with normal S1 soft S2.  4+ aortic murmur gallop, no rub, PMI is normal size and placement, carotid upstroke normal w with bruit, jugular venous pressure is normal Abdomen: Soft, non-tender, non-distended with normoactive bowel sounds. No hepatomegaly. No rebound/guarding. No obvious abdominal masses. Abdominal aorta is normal size without bruit Extremities: Trace edema. no cyanosis, no clubbing, no ulcers  Peripheral : 2+ bilateral upper extremity pulses, 2+ bilateral femoral pulses, 2+ bilateral dorsal pedal pulse Neuro: Alert and oriented. No facial asymmetry. No focal deficit. Moves all extremities spontaneously. Musculoskeletal: Normal muscle tone without kyphosis Psych:  Responds to questions appropriately with a normal affect.    Assessment: 85 year old female with moderate aortic valve stenosis symptomatic bradycardia status post pacemaker placement chronic kidney disease stage III having acute GI bleed without evidence of myocardial infarction or congestive heart failure at lowest risk possible for further intervention including colonoscopy and endoscopy as needed  Plan: 1.  Continue supportive care for GI bleed and further intervention or diagnostic testings as necessary 2.  No further cardiac diagnostics necessary at this time due to hemodynamic stability and no current evidence of heart failure or angina 3.  No further intervention or assessment needed of normally functioning dual-chamber pacemaker 4.  No further intervention of stable moderate aortic valve  stenosis  Signed, Lamar Blinks M.D. [facility] Card[facility] 06/30/2018, 5:47 PM

## 2018-06-30 NOTE — Progress Notes (Signed)
Notified Dr. Enid Baas of PVCs and short burst of what appeared to be IVR per telemetry report.  Patient also stated she felt like her heart was racing but at that time rate was in mid 80s.

## 2018-06-30 NOTE — Progress Notes (Signed)
[facility] - [place] [facility]   PATIENT NAME: Vanessa Lang    MR#:  [id number]  DATE OF BIRTH:  03-04-1923  SUBJECTIVE:  CHIEF COMPLAINT:   Chief Complaint  Patient presents with  . Rectal Bleeding   Patient initially admitted to the ICU due to ongoing active GI bleed.  Seen by interventional radiologist and had angiogram done but unable to find the source vessel.  No further bleeding reported.  Patient tolerating clear liquid diet well.    Patient denies any nausea vomiting abdominal pains.  Denies any further bleeding.  REVIEW OF SYSTEMS:  Review of Systems  Constitutional: Negative for chills and fever.  HENT: Negative for hearing loss and tinnitus.   Eyes: Negative for blurred vision and double vision.  Respiratory: Negative for cough, hemoptysis and sputum production.   Cardiovascular: Negative for chest pain and palpitations.  Gastrointestinal: Negative for abdominal pain, heartburn and nausea.  Genitourinary: Negative for dysuria and urgency.  Musculoskeletal: Negative for myalgias and neck pain.  Skin: Negative for itching and rash.  Neurological: Negative for dizziness and headaches.  Psychiatric/Behavioral: Negative for depression and hallucinations.      DRUG ALLERGIES:   Allergies  Allergen Reactions  . Propranolol Rash   VITALS:  Blood pressure (!) 149/58, pulse 65, temperature 98.3 F (36.8 C), temperature source Oral, resp. rate (!) 24, height 5\' 11"  (1.803 m), weight 62.5 kg, SpO2 98 %. PHYSICAL EXAMINATION:  Physical Exam  Constitutional: She is oriented to person, place, and time. She appears well-developed and well-nourished.  HENT:  Head: Normocephalic and atraumatic.  Eyes: Pupils are equal, round, and reactive to light. Conjunctivae and EOM are normal. Right eye exhibits no discharge.  Neck: Normal range of motion. Neck supple. No tracheal deviation present.  Cardiovascular: Normal rate, regular rhythm and normal heart  sounds.  Respiratory: Effort normal and breath sounds normal. She has no wheezes.  GI: Soft. Bowel sounds are normal. There is no abdominal tenderness.  Genitourinary:    Genitourinary Comments: Right groin/inguinal hernia.  Chronic.  Reducible.  No pain.  Nonobstructed. Slight dressing at the site of recent right groin hematoma.   Musculoskeletal: Normal range of motion.        General: No edema.  Neurological: She is alert and oriented to person, place, and time. No cranial nerve deficit.  Skin: Skin is warm. She is not diaphoretic. No erythema.  Psychiatric: She has a normal mood and affect. Her behavior is normal.    LABORATORY PANEL:  Female CBC Recent Labs  Lab 06/30/18 0614  WBC 9.6  HGB 7.6*  HCT 23.0*  PLT 118*   ------------------------------------------------------------------------------------------------------------------ Chemistries  Recent Labs  Lab 06/28/18 0151  06/30/18 0614  NA 136   < > 143  K 3.9   < > 3.8  CL 103   < > 114*  CO2 24   < > 22  GLUCOSE 117*   < > 81  BUN 36*   < > 20  CREATININE 1.49*   < > 1.25*  CALCIUM 9.2   < > 8.3*  MG  --    < > 1.7  AST 22  --   --   ALT 13  --   --   ALKPHOS 67  --   --   BILITOT 0.4  --   --    < > = values in this interval not displayed.   RADIOLOGY:  Ct Abdomen Pelvis Wo Contrast  Result Date:  06/30/2018 CLINICAL DATA:  Status post recent angiogram with groin hematoma and drop in hemoglobin. EXAM: CT ABDOMEN AND PELVIS WITHOUT CONTRAST TECHNIQUE: Multidetector CT imaging of the abdomen and pelvis was performed following the standard protocol without IV contrast. COMPARISON:  06/28/2018 FINDINGS: Lower chest: Small left pleural effusion. Minimal overlying atelectasis. No worrisome pulmonary lesions. Stable mild cardiac enlargement and vascular calcifications. Hepatobiliary: No focal hepatic lesion or intrahepatic biliary dilatation. The gallbladder contains contrast likely due to vicarious excretion.  Pancreas: No mass, inflammation or ductal dilatation. Spleen: Normal size.  No focal lesions. Adrenals/Urinary Tract: Stable nodularity of both adrenal glands. No discrete mass. Possible nodular hyperplasia or small adenomas. Again demonstrated are numerous bilateral simple and complex renal cysts. Several of these demonstrate dense calcifications and some cysts are hyperdense/hemorrhagic. I do not see any worrisome lesions without contrast and I do not see any significant change since prior CT scan from 2016. No hydronephrosis. The bladder contains contrast and appears normal. No asymmetric bladder wall thickening or mass. Stomach/Bowel: Stable large right inguinal hernia containing numerous small bowel loops and evidence of surgical anastomosis. The colon demonstrates extensive diverticulosis but no definite findings for acute diverticulitis. Vascular/Lymphatic: Extensive atherosclerotic calcifications involving the aorta and iliac arteries. Stable branch vessel calcifications also. No mesenteric or retroperitoneal mass or adenopathy. Reproductive: Surgically absent. Other: There is a small right groin hematoma contiguous with a small/moderate sized right-sided extraperitoneal pelvic hematoma. No mass effect on the bladder appeared presacral edema/fluid is also noted. Musculoskeletal: Stable degenerative changes involving the spine and hips. No acute bony findings or destructive bony lesions. IMPRESSION: 1. Small right groin hematoma contiguous with a small to moderate extraperitoneal right-sided pelvic hematoma. 2. Stable large right inguinal hernia containing numerous small bowel loops but no findings for obstruction or incarceration. 3. Numerous complex renal cysts bilaterally but no obvious worrisome lesion. 4. Diffuse colonic diverticulosis but no findings for acute diverticulitis. 5. Advanced atherosclerotic calcifications involving the aorta, iliac arteries and branch vessels. 6. Small left pleural  effusion. Electronically Signed   By: Rudie Meyer M.D.   On: 06/30/2018 11:22   ASSESSMENT AND PLAN:    1.  GI bleed: Patient presented with melena with some intermittent hematochezia.  Has had multiple maroon-colored bleeding on presentation. I discussed case with gastroenterologist on-call initially who recommended bleeding scan since patient not prepped for colonoscopy. Bleeding scan came back positive.  Discussed case with interventional radiologist Dr. Deanne Coffer and patient subsequently had mesenteric angiogram done.  Bleeding vessel was not found. Patient did develop perforation of R CFA retroperitoneal branch, with hematoma, controlled with coil embolization by interventional radiologist.  On exam this morning no evidence of any significant groin hematoma.  Does have chronic right inguinal hernia. Due to dropping hemoglobin to 7.6 today from 8.6 yesterday I discussed case with gastroenterologist Dr. Wyline Mood.  Patient clinically does not have any evidence of ongoing GI bleed.  He recommended discussing case with interventional radiologist.  I spoke with the interventional radiologist on-call Dr. Lowella Dandy who reviewed the images and also discussed with Dr. Deanne Coffer that performed the procedure.  He confirmed the size of the hematoma on imaging is smaller than previously and does not appear to have any evidence of any ongoing active GI bleed.  Bleeding was likely diverticular in origin.  Plan is to manage conservatively. Will repeat CBC in a.m.  If no evidence of bleeding and drop in hemoglobin we will transfuse with packed red blood cells. Patient currently on clear liquid diet.  Will advance diet in a.m. if no evidence of bleeding and patient remains hemodynamically stable. Will consider consulting gastroenterologist if patient has clinical evidence of repeat GI bleeding  2.  Hypertension: Controlled; hold blood pressure medications for now Blood pressure trending up.  Discontinue IV fluids.  3.   Hypothyroidism: TSH normal.  Continue Synthroid  4.  CHF: Chronic; diastolic.  Stable.   DVT prophylaxis: SCDs No heparin products due to GI bleed  All the records are reviewed and case discussed with Care Management/Social Worker. Management plans discussed with the patient, family and they are in agreement.  CODE STATUS: Full Code  TOTAL TIME TAKING CARE OF THIS PATIENT: .   More than 50% of the time was spent in counseling/coordination of care: YES  POSSIBLE D/C IN 2DAYS, DEPENDING ON CLINICAL CONDITION.   Meigan Pates M.D on 06/30/2018 at 2:03 PM  Between 7am to 6pm - Pager - [phone]  After 6pm go to [email or web address] - Social research officer, government  [facility] [place] [facility]  Office  [phone]  CC: Primary care physician; Dorothey Baseman, MD  Note: This dictation was prepared with Dragon dictation along with smaller phrase technology. Any transcriptional errors that result from this process are unintentional.

## 2018-07-01 DIAGNOSIS — E440 Moderate protein-calorie malnutrition: Secondary | ICD-10-CM

## 2018-07-01 LAB — MAGNESIUM: MAGNESIUM: 1.6 mg/dL — AB (ref 1.7–2.4)

## 2018-07-01 LAB — CBC
HEMATOCRIT: 23.7 % — AB (ref 36.0–46.0)
Hemoglobin: 7.8 g/dL — ABNORMAL LOW (ref 12.0–15.0)
MCH: 30.8 pg (ref 26.0–34.0)
MCHC: 32.9 g/dL (ref 30.0–36.0)
MCV: 93.7 fL (ref 80.0–100.0)
Platelets: 128 10*3/uL — ABNORMAL LOW (ref 150–400)
RBC: 2.53 MIL/uL — ABNORMAL LOW (ref 3.87–5.11)
RDW: 14.6 % (ref 11.5–15.5)
WBC: 9.1 10*3/uL (ref 4.0–10.5)
nRBC: 0 % (ref 0.0–0.2)

## 2018-07-01 LAB — BASIC METABOLIC PANEL
Anion gap: 8 (ref 5–15)
BUN: 15 mg/dL (ref 8–23)
CO2: 20 mmol/L — ABNORMAL LOW (ref 22–32)
CREATININE: 1.08 mg/dL — AB (ref 0.44–1.00)
Calcium: 8.6 mg/dL — ABNORMAL LOW (ref 8.9–10.3)
Chloride: 113 mmol/L — ABNORMAL HIGH (ref 98–111)
GFR calc Af Amer: 51 mL/min — ABNORMAL LOW (ref >60)
GFR calc non Af Amer: 44 mL/min — ABNORMAL LOW (ref >60)
Glucose, Bld: 80 mg/dL (ref 70–99)
Potassium: 3.7 mmol/L (ref 3.5–5.1)
Sodium: 141 mmol/L (ref 135–145)

## 2018-07-01 LAB — HEMOGLOBIN AND HEMATOCRIT, BLOOD
HCT: 25.3 % — ABNORMAL LOW (ref 36.0–46.0)
Hemoglobin: 8.3 g/dL — ABNORMAL LOW (ref 12.0–15.0)

## 2018-07-01 MED ORDER — ADULT MULTIVITAMIN W/MINERALS CH
ORAL_TABLET | Freq: Every day | ORAL | Status: DC
  Administered 2018-07-01 – 2018-07-03 (×3): 1 via ORAL
  Filled 2018-07-01 (×3): qty 1

## 2018-07-01 MED ORDER — GABAPENTIN 100 MG PO CAPS
100.0000 mg | ORAL_CAPSULE | Freq: Three times a day (TID) | ORAL | Status: DC
  Administered 2018-07-01 – 2018-07-03 (×8): 100 mg via ORAL
  Filled 2018-07-01 (×8): qty 1

## 2018-07-01 MED ORDER — ISOSORBIDE MONONITRATE ER 30 MG PO TB24
30.0000 mg | ORAL_TABLET | Freq: Every day | ORAL | Status: DC
  Administered 2018-07-01: 30 mg via ORAL
  Filled 2018-07-01: qty 1

## 2018-07-01 MED ORDER — FERROUS SULFATE 325 (65 FE) MG PO TABS
325.0000 mg | ORAL_TABLET | Freq: Two times a day (BID) | ORAL | Status: DC
  Administered 2018-07-01 – 2018-07-03 (×4): 325 mg via ORAL
  Filled 2018-07-01 (×4): qty 1

## 2018-07-01 MED ORDER — ENSURE ENLIVE PO LIQD
237.0000 mL | Freq: Two times a day (BID) | ORAL | Status: DC
  Administered 2018-07-01 – 2018-07-03 (×5): 237 mL via ORAL

## 2018-07-01 MED ORDER — MAGNESIUM OXIDE 400 (241.3 MG) MG PO TABS
800.0000 mg | ORAL_TABLET | Freq: Once | ORAL | Status: AC
  Administered 2018-07-01: 800 mg via ORAL
  Filled 2018-07-01: qty 2

## 2018-07-01 MED ORDER — AMLODIPINE BESYLATE 5 MG PO TABS
5.0000 mg | ORAL_TABLET | Freq: Every day | ORAL | Status: DC
  Administered 2018-07-01 – 2018-07-03 (×3): 5 mg via ORAL
  Filled 2018-07-01 (×3): qty 1

## 2018-07-01 MED ORDER — DONEPEZIL HCL 5 MG PO TABS
5.0000 mg | ORAL_TABLET | Freq: Every day | ORAL | Status: DC
  Administered 2018-07-01 – 2018-07-03 (×3): 5 mg via ORAL
  Filled 2018-07-01 (×3): qty 1

## 2018-07-01 MED ORDER — LISINOPRIL 20 MG PO TABS
40.0000 mg | ORAL_TABLET | Freq: Every day | ORAL | Status: DC
  Administered 2018-07-01: 40 mg via ORAL
  Filled 2018-07-01: qty 2

## 2018-07-01 MED ORDER — ESCITALOPRAM OXALATE 10 MG PO TABS
5.0000 mg | ORAL_TABLET | Freq: Every day | ORAL | Status: DC
  Administered 2018-07-01 – 2018-07-03 (×3): 5 mg via ORAL
  Filled 2018-07-01 (×3): qty 0.5

## 2018-07-01 MED ORDER — HYDROCHLOROTHIAZIDE 25 MG PO TABS
25.0000 mg | ORAL_TABLET | Freq: Every day | ORAL | Status: DC
  Administered 2018-07-01: 25 mg via ORAL
  Filled 2018-07-01: qty 1

## 2018-07-01 MED ORDER — ATENOLOL 50 MG PO TABS
50.0000 mg | ORAL_TABLET | Freq: Every day | ORAL | Status: DC
  Administered 2018-07-01 – 2018-07-03 (×3): 50 mg via ORAL
  Filled 2018-07-01 (×3): qty 1

## 2018-07-01 MED ORDER — ALPRAZOLAM 0.25 MG PO TABS
0.2500 mg | ORAL_TABLET | Freq: Every day | ORAL | Status: DC | PRN
  Administered 2018-07-03: 0.25 mg via ORAL
  Filled 2018-07-01: qty 1

## 2018-07-01 NOTE — Progress Notes (Signed)
[facility] Encounter Note  Patient: Vanessa Lang / Admit Date: 06/28/2018 / Date of Encounter: 07/01/2018, 8:20 AM   Subjective: Patient feeling well this morning.  No evidence of chest pain or congestive heart failure symptoms.  No evidence of acute coronary syndrome and or myocardial infarction.  Patient has a normal heart rhythm with pacemaker to previous heart block and bradycardia.  Patient occasionally having preventricular contractions.  Anemia improved after packed red blood cells and no current evidence of bleeding  Review of Systems: Positive for: None Negative for: Vision change, hearing change, syncope, dizziness, nausea, vomiting,diarrhea, bloody stool, stomach pain, cough, congestion, diaphoresis, urinary frequency, urinary pain,skin lesions, skin rashes Others previously listed  Objective: Telemetry: Atrial ventricular pacing with occasional preventricular contractions Physical Exam: Blood pressure (!) 159/70, pulse 60, temperature 98.1 F (36.7 C), temperature source Oral, resp. rate 20, height  (1.803 m), weight 61.1 kg, SpO2 97 %. Body mass index is 18.8 kg/m. General: Well developed, well nourished, in no acute distress. Head: Normocephalic, atraumatic, sclera non-icteric, no xanthomas, nares are without discharge. Neck: No apparent masses Lungs: Normal respirations with no wheezes, no rhonchi, no rales , few crackles   Heart: Regular rate and rhythm, normal S1 soft S2, 4+ aortic murmur, no rub, no gallop, PMI is normal size and placement, carotid upstroke normal with bruit, jugular venous pressure normal Abdomen: Soft, non-tender, non-distended with normoactive bowel sounds. No hepatosplenomegaly. Abdominal aorta is normal size without bruit Extremities: No edema, no clubbing, no cyanosis, no ulcers,  Peripheral: 2+ radial, 2+ femoral, 2+ dorsal pedal pulses Neuro: Alert and oriented. Moves all extremities spontaneously. Psych:  Responds  to questions appropriately with a normal affect.   Intake/Output Summary (Last 24 hours) at 07/01/2018 0820 Last data filed at 07/01/2018 0500 Gross per 24 hour  Intake 2325.6 ml  Output 1250 ml  Net 1075.6 ml    Inpatient Medications:  . levothyroxine  75 mcg Oral Daily  . magnesium oxide  800 mg Oral Once  . pantoprazole (PROTONIX) IV  40 mg Intravenous Q12H   Infusions:   Labs: Recent Labs    06/30/18 0614 07/01/18 0548  NA 143 141  K 3.8 3.7  CL 114* 113*  CO2 22 20*  GLUCOSE 81 80  BUN 20 15  CREATININE 1.25* 1.08*  CALCIUM 8.3* 8.6*  MG 1.7 1.6*   No results for input(s): AST, ALT, ALKPHOS, BILITOT, PROT, ALBUMIN in the last 72 hours. Recent Labs    06/30/18 0614 07/01/18 0548  WBC 9.6 9.1  HGB 7.6* 7.8*  HCT 23.0* 23.7*  MCV 93.5 93.7  PLT 118* 128*   No results for input(s): CKTOTAL, CKMB, TROPONINI in the last 72 hours. Invalid input(s): POCBNP No results for input(s): HGBA1C in the last 72 hours.   Weights: Filed Weights   06/28/18 1728 06/30/18 0054 07/01/18 0500  Weight: 60.2 kg 62.5 kg 61.1 kg     Radiology/Studies:  Ct Abdomen Pelvis Wo Contrast  Result Date: 06/30/2018 CLINICAL DATA:  Status post recent angiogram with groin hematoma and drop in hemoglobin. EXAM: CT ABDOMEN AND PELVIS WITHOUT CONTRAST TECHNIQUE: Multidetector CT imaging of the abdomen and pelvis was performed following the standard protocol without IV contrast. COMPARISON:  06/28/2018 FINDINGS: Lower chest: Small left pleural effusion. Minimal overlying atelectasis. No worrisome pulmonary lesions. Stable mild cardiac enlargement and vascular calcifications. Hepatobiliary: No focal hepatic lesion or intrahepatic biliary dilatation. The gallbladder contains contrast likely due to vicarious excretion. Pancreas: No mass,  inflammation or ductal dilatation. Spleen: Normal size.  No focal lesions. Adrenals/Urinary Tract: Stable nodularity of both adrenal glands. No discrete mass.  Possible nodular hyperplasia or small adenomas. Again demonstrated are numerous bilateral simple and complex renal cysts. Several of these demonstrate dense calcifications and some cysts are hyperdense/hemorrhagic. I do not see any worrisome lesions without contrast and I do not see any significant change since prior CT scan from 2016. No hydronephrosis. The bladder contains contrast and appears normal. No asymmetric bladder wall thickening or mass. Stomach/Bowel: Stable large right inguinal hernia containing numerous small bowel loops and evidence of surgical anastomosis. The colon demonstrates extensive diverticulosis but no definite findings for acute diverticulitis. Vascular/Lymphatic: Extensive atherosclerotic calcifications involving the aorta and iliac arteries. Stable branch vessel calcifications also. No mesenteric or retroperitoneal mass or adenopathy. Reproductive: Surgically absent. Other: There is a small right groin hematoma contiguous with a small/moderate sized right-sided extraperitoneal pelvic hematoma. No mass effect on the bladder appeared presacral edema/fluid is also noted. Musculoskeletal: Stable degenerative changes involving the spine and hips. No acute bony findings or destructive bony lesions. IMPRESSION: 1. Small right groin hematoma contiguous with a small to moderate extraperitoneal right-sided pelvic hematoma. 2. Stable large right inguinal hernia containing numerous small bowel loops but no findings for obstruction or incarceration. 3. Numerous complex renal cysts bilaterally but no obvious worrisome lesion. 4. Diffuse colonic diverticulosis but no findings for acute diverticulitis. 5. Advanced atherosclerotic calcifications involving the aorta, iliac arteries and branch vessels. 6. Small left pleural effusion. Electronically Signed   By: Rudie Meyer M.D.   On: 06/30/2018 11:22   Nm Gi Blood Loss  Result Date: 06/28/2018 CLINICAL DATA:  GI bleed. Started 1030 last night. Prior  colectomy. EXAM: NUCLEAR MEDICINE GASTROINTESTINAL BLEEDING SCAN TECHNIQUE: Sequential abdominal images were obtained following intravenous administration of Tc-59m labeled red blood cells. RADIOPHARMACEUTICALS:  21.459 mCi Tc-28m pertechnetate in-vitro labeled red cells. COMPARISON:  None. FINDINGS: Progressively increasing and peristalsing radiotracer in the left upper abdomen extending into the left lower abdomen. Normal physiologic biodistribution of the radiotracer is demonstrated throughout the abdomen. IMPRESSION: Progressively increasing and peristalsing radiotracer in the left upper abdomen extending into the left lower abdomen. Findings are consistent with active gastrointestinal bleeding in the splenic flexure region versus small bowel. Critical Value/emergent results were called by telephone at the time of interpretation on 06/28/2018 at 1:30 pm to Dr. Jama Flavors , who verbally acknowledged these results. Electronically Signed   By: Elige Ko   On: 06/28/2018 13:30   Ir Angiogram Visceral Selective  Result Date: 06/29/2018 CLINICAL DATA:  GI bleed, localized to mid descending segment on CTA. EXAM: SELECTIVE VISCERAL ARTERIOGRAPHY PROCEDURE: The procedure, risks (including but not limited to bleeding, infection, organ damage ), benefits, and alternatives were explained to the patient, and sister on the phone. Questions regarding the procedure were encouraged and answered. The patient understands and consents to the procedure. Bilateral femoral regions prepped and draped in usual sterile fashion. Maximal barrier sterile technique was utilized including caps, mask, sterile gowns, sterile gloves, sterile drape, hand hygiene and skin antiseptic. The right common femoral artery was localized under ultrasound. Under real-time ultrasound guidance, with medial displacement of her inguinal hernia, the vessel was accessed with a 21-gauge micropuncture needle from a lateral approach. A 018 guidewire advanced  easily. Needle was exchanged for a transitional dilator, and with advancement some resistance was encountered. The guidewire and inner dilator were removed and small contrast injection under fluoroscopy demonstrated extraluminal contrast in the  extraperitoneal space lateral to the urinary bladder. The dilator was withdrawn until the tip was in the lumen of the common femoral artery. Injection showed continued extravasation from a small muscular branch medial to the distal common femoral artery. Contralateral access was required to manage the right common femoral branch extravasation. Therefore, the left common femoral artery was localized under ultrasound, patency confirmed and documented. Under real-time ultrasound guidance, the vessel was accessed with a 21-gauge micropuncture needle, exchanged for a transitional dilator. Through this, a Bentson wire was advanced centrally to the aorta. Over this, a 5 French sheath was placed, through which the 5 [place] RIM catheter was advanced across the aortic bifurcation for right pelvic arteriography. This demonstrated continued extravasation from the common femoral branch. The catheter and sheath were removed over an angled stiff glidewire exchanged for a 7 French sheath, through which 7 [place] Fogarty occlusion balloon was advanced over the wire to the distal external iliac artery in the balloon gently inflated until occlusive. A coaxial microcatheter was advanced through the Fogarty catheter and with the aid of a 016 guidewire the arterial branch of concern was catheterized, and a 7 mm Ruby coil placed at its origin. Injection through the occlusion balloon catheter showed no further extravasation. The balloon was deflated. Arteriogram through the catheter showed good flow distally through the right common femoral artery, no further extravasation, good position of the coil. The Fogarty catheter was exchanged over guidewire for a rim catheter, and attempts to engage the  origin of the IMA were attempted but ultimately unsuccessful. For this reason, the rim was exchanged for a 5 French pigtail catheter for oblique distal aortography demonstrating apparent occlusion and nonvisualization of the inferior mesenteric artery. The pigtail catheter was then exchanged for a C2 catheter, used to selectively catheterize the superior mesenteric artery for selective arteriography in multiple projections. As no extravasation was identified, attention was then turned back to the expected region of the inferior mesenteric artery. The 5 French pigtail was placed again in the distal abdominal aorta for flush AP aortography. Still the inferior mesenteric artery could not be localized. Given the patient's renal insufficiency , known distal site of the bleed on recent CTA, and apparent celiac origin occlusion on recent CTA, celiac arteriography was not attempted. The catheter and sheath were removed and hemostasis achieved with the aid of the StarClose device after confirmatory femoral arteriography. The patient tolerated the procedure well. COMPLICATIONS: Extravasation of from muscular branch of the right common femoral artery, successfully treated with endovascular coil embolization. SIR level B: Nominal therapy (including overnight admission for observation), no consequence. ANESTHESIA/SEDATION: Intravenous Fentanyl and Versed were administered as conscious sedation during continuous cardiorespiratory monitoring by the radiology RN, with a total moderate sedation time of 56 minutes. MEDICATIONS: Lidocaine 1% subcutaneous CONTRAST:  85mL ISOVUE-300 IOPAMIDOL (ISOVUE-300) INJECTION 61% FINDINGS: The active extravasation seen in the mid descending colon on recent CTA was not identified. 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 No other mesenteric vascular abnormality identified. Access site complication with resultant extravasation from a muscular branch of the right common femoral artery was  identified and controlled with coil embolization. IMPRESSION: 1. Negative mesenteric arteriogram. No evidence of active extravasation. 2. 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 Electronically Signed   By: Corlis Leak M.D.   On: 06/29/2018 12:10   Ct Angio Abd/pel W/ And/or W/o  Result Date: 06/28/2018 CLINICAL DATA:  Lower GI bleed. Currently hemodynamically stable. History of partial colectomy. Positive red blood cell scintigraphy with  evidence of active extravasation in the left mid abdomen, indeterminate whether small bowel versus colon. EXAM: CTA ABDOMEN AND PELVIS wITHOUT AND WITH CONTRAST TECHNIQUE: Multidetector CT imaging of the abdomen and pelvis was performed using the standard protocol during bolus administration of intravenous contrast. Multiplanar reconstructed images and MIPs were obtained and reviewed to evaluate the vascular anatomy. CONTRAST:  73mL OMNIPAQUE IOHEXOL 350 MG/ML SOLN COMPARISON:  08/03/2014 FINDINGS: VASCULAR Coronary calcifications. Aorta: Ectatic up to 3 cm diameter. Moderate calcified plaque. No dissection or stenosis. Celiac: Short-segment origin occlusion or high-grade stenosis, reconstituted distally. SMA: Patent. Classic distal branch anatomy. Renals: Single left, with partially calcified ostial plaque resulting in short segment mild stenosis. Single right, patent. IMA: High-grade origin stenosis, patent but diminutive distally. Inflow: Scattered calcified plaque through the common and internal iliac arteries. Mild tortuosity of the iliac arterial system without aneurysm or stenosis. Proximal Outflow: Bilateral common femoral and visualized portions of the superficial and profunda femoral arteries are patent without evidence of aneurysm, dissection, vasculitis or significant stenosis. Veins: Patent patent veins, portal vein, SMV, bilateral renal veins. No venous pathology identified on this arterial phase study. Review of the MIP images confirms the above  findings. NON-VASCULAR Lower chest: Transvenous pacing leads partially visualized. Coarse aortic leaflet calcifications. No pleural or pericardial effusion. Visualized lung bases clear. Hepatobiliary: No focal liver abnormality is seen. No gallstones, gallbladder wall thickening, or biliary dilatation. Pancreas: Unremarkable. No pancreatic ductal dilatation or surrounding inflammatory changes. Spleen: Normal in size without focal abnormality. Adrenals/Urinary Tract: Unremarkable adrenals. Innumerable bilateral renal lesions many measuring near fluid attenuation consistent with simple cysts, others more hyperdense, others coarsely peripherally calcified. No hydronephrosis. Urinary bladder is distended. Stomach/Bowel: Stomach is decompressed. Small bowel is nondilated. Multiple small bowel loops are involved in a large right inguinal hernia. No evidence of obstruction or strangulation. Anastomotic staple line in small bowel within the hernia sac. The appendix is not discretely identified. Innumerable diverticula throughout the colon. There is ACTIVE ARTERIAL EXTRAVASATION into the mid descending colon, with accumulation of contrast with of the mid descending colon on the delayed scan. Lymphatic: No abdominal or pelvic adenopathy. Reproductive: Status post hysterectomy. No adnexal masses. Other: No ascites. No free air. Musculoskeletal: Multilevel spondylitic changes in the lower lumbar spine with grade 1 anterolisthesis L3-4, L4-5. No fracture or worrisome bone lesion. IMPRESSION: VASCULAR 1. POSITIVE for active arterial extravasation into the mid descending colon, corresponding to findings on recent scintigraphy. 2. Origin occlusion of the celiac axis 3. Coronary and aortoiliac  atherosclerosis (ICD10-170.0). NON-VASCULAR 1. Extensive colonic diverticulosis. 2. Large right inguinal hernia containing multiple small bowel loops without obstruction or strangulation. 3. Multiple bilateral renal lesions, some of which are  hyperdense and others are incompletely characterized. Electronically Signed   By: Corlis Leak M.D.   On: 06/28/2018 17:26     Assessment and Recommendation  85 y.o. female with moderate aortic valve stenosis and normal LV systolic function with chronic kidney disease stage III and complete heart block with appropriate pacing and occasional preventricular contractions with significant GI bleed and no current evidence of myocardial infarction or anginal symptoms 1.  Proceed with further work-up and treatment of GI bleed including any procedures required due to patient currently at lowest risk possible 2.  No further cardiac intervention or diagnostics necessary at this time due to no evidence of acute coronary syndrome or congestive heart failure 3.  No further intervention of pacemaker working well 4.  No further intervention of aortic stenosis stable at this  time 5.  Call if further questions  Signed, Arnoldo HookerBruce Makenzee Choudhry M.D. FACC

## 2018-07-01 NOTE — Significant Event (Signed)
Rapid Response Event Note  Overview: Time Called: 0918 Arrival Time: 0920 Event Type: Cardiac  Initial Focused Assessment: Rapid response RN arrived in patient's room and patient was alert sitting in armchair with nurses and visitors at bedside. Per patient's RN, Beth, patient had gone into VT. Patient had been in paced rhythm with PVCs yesterday and prior to VT. Cardiology had already seen patient this morning and ordered magnesium replacement for Magnesium of 1.6. Potassium 3.7 this morning. Patient alert and oriented, reported slight shortness of breath and dizziness prior to rapid response nurse arrival, gone by the time rapid response RN arrived. Patient currently with HR 78 AV paced with PVCs, oxygen saturations 98% on room air, RR 18 regular, BP 132/96, MAP 107.    Interventions: 12 lead EKG obtained showed AV pacing with PVCs. Beth RN administering magnesium currently.  Plan of Care (if not transferred): Patient to remain on 2C for now. Currently getting magnesium replacement and will remain on cardiac monitoring. Dr. Enid Baas on the way to assess patient in person. Cardiology paged to update with incident.  Event Summary: Name of Physician Notified: Dr. Enid Baas at [phone]    at    Outcome: Stayed in room and stabalized  Event End Time: 8416  Adron Bene [facility]

## 2018-07-01 NOTE — Progress Notes (Signed)
Initial Nutrition Assessment  DOCUMENTATION CODES:   Non-severe (moderate) malnutrition in context of chronic illness  INTERVENTION:   Ensure Enlive po BID, each supplement provides 350 kcal and 20 grams of protein  MVI daily   Pt likely at moderate refeed risk; recommend monitor K, Mg and P labs daily until stable.    NUTRITION DIAGNOSIS:   Moderate Malnutrition related to chronic illness(CHF, advanced age ) as evidenced by mild to moderate fat depletions, moderate to severe muscle depletions.  GOAL:   Patient will meet greater than or equal to 90% of their needs  MONITOR:   PO intake, Supplement acceptance, Labs, Weight trends, Skin, I & O's  REASON FOR ASSESSMENT:   Other (Comment)(low BMI )    ASSESSMENT:   85 y/o female with h/o CHF, DVT, HTN admitted with acute diverticulitis.   Pt s/p mesenteric angiogram 2/24  Met with pt in room today. Pt in good spirits, sitting up in chair and making jokes. Pt reports poor appetite and oral intake pta but reports her appetite is returning today. Pt reports eating 90% of her clear liquid breakfast today but reports that she is sick of chicken broth and jello. Pt reports drinking Boost Plus at home. Pt reports she is willing to drink Ensure while in hospital. Pt advancing to a full liquid diet today. Pt denies any abdominal pain or nausea today. Pt reports passing large amounts of flatus. RD will add supplements to help pt meet her estimated needs. Pt likely at moderate refeed risk; recommend monitor K, Mg and P labs daily until stable.    Medications reviewed and include: ferrous sulfate, hydrochlorothiazide, synthroid, MVI, protonix  Labs reviewed: creat 1.08(H), Mg 1.6(L) Hgb 7.8(L), Hct 23.7(L)  NUTRITION - FOCUSED PHYSICAL EXAM:    Most Recent Value  Orbital Region  Mild depletion  Upper Arm Region  Moderate depletion  Thoracic and Lumbar Region  Moderate depletion  Buccal Region  Mild depletion  Temple Region  Mild  depletion  Clavicle Bone Region  Moderate depletion  Clavicle and Acromion Bone Region  Moderate depletion  Scapular Bone Region  Moderate depletion  Dorsal Hand  Moderate depletion  Patellar Region  Moderate depletion  Anterior Thigh Region  Moderate depletion  Posterior Calf Region  Severe depletion  Edema (RD Assessment)  None  Hair  Reviewed  Eyes  Reviewed  Mouth  Reviewed  Skin  Reviewed  Nails  Reviewed     Diet Order:   Diet Order            Diet full liquid Room service appropriate? Yes; Fluid consistency: Thin  Diet effective now             EDUCATION NEEDS:   Education needs have been addressed  Skin:  Skin Assessment: Reviewed RN Assessment  Last BM:  2/24  Height:   Ht Readings from Last 1 Encounters:  06/28/18 5' 11" (1.803 m)    Weight:   Wt Readings from Last 1 Encounters:  07/01/18 61.1 kg    Ideal Body Weight:  70.4 kg  BMI:  Body mass index is 18.8 kg/m.  Estimated Nutritional Needs:   Kcal:  1500-1700kcal/day   Protein:  80-90g/day   Fluid:  1.5L/day   Koleen Distance MS, RD, LDN Pager #- [phone] Office#- [phone] After Hours Pager: [phone]

## 2018-07-01 NOTE — Progress Notes (Signed)
[employer] - [place] at [facility]   PATIENT NAME: Vanessa Lang    MR#:  [id number]  DATE OF BIRTH:  01-18-1923  SUBJECTIVE:  CHIEF COMPLAINT:   Chief Complaint  Patient presents with  . Rectal Bleeding   Patient initially admitted to the ICU due to ongoing active GI bleed.  Seen by interventional radiologist and had angiogram done but unable to find the source vessel.  No bleeding reported overnight.  Hemoglobin remained stable this morning.   Patient was reported to have had an episode of V. tach which resolved at time of my evaluation.  Hemodynamically stable at this time.  Since patient not tolerating p.o. intake and blood pressure stable, home dose of atenolol resumed.  Cardiology service following patient and updated.  REVIEW OF SYSTEMS:  Review of Systems  Constitutional: Negative for chills and fever.  HENT: Negative for hearing loss and tinnitus.   Eyes: Negative for blurred vision and double vision.  Respiratory: Negative for cough, hemoptysis and sputum production.   Cardiovascular: Negative for chest pain and palpitations.  Gastrointestinal: Negative for abdominal pain, heartburn and nausea.  Genitourinary: Negative for dysuria and urgency.  Musculoskeletal: Negative for myalgias and neck pain.  Skin: Negative for itching and rash.  Neurological: Negative for dizziness and headaches.  Psychiatric/Behavioral: Negative for depression and hallucinations.      DRUG ALLERGIES:   Allergies  Allergen Reactions  . Propranolol Rash   VITALS:  Blood pressure (!) 150/85, pulse 60, temperature 98.6 F (37 C), temperature source Oral, resp. rate 16, height 5\' 11"  (1.803 m), weight 61.1 kg, SpO2 100 %. PHYSICAL EXAMINATION:  Physical Exam  Constitutional: She is oriented to person, place, and time. She appears well-developed and well-nourished.  HENT:  Head: Normocephalic and atraumatic.  Eyes: Pupils are equal, round, and reactive to light.  Conjunctivae and EOM are normal. Right eye exhibits no discharge.  Neck: Normal range of motion. Neck supple. No tracheal deviation present.  Cardiovascular: Normal rate, regular rhythm and normal heart sounds.  Respiratory: Effort normal and breath sounds normal. She has no wheezes.  GI: Soft. Bowel sounds are normal. There is no abdominal tenderness.  Genitourinary:    Genitourinary Comments: Right groin/inguinal hernia.  Chronic.  Reducible.  No pain.  Nonobstructed. Slight dressing at the site of recent right groin hematoma.   Musculoskeletal: Normal range of motion.        General: No edema.  Neurological: She is alert and oriented to person, place, and time. No cranial nerve deficit.  Skin: Skin is warm. She is not diaphoretic. No erythema.  Psychiatric: She has a normal mood and affect. Her behavior is normal.    LABORATORY PANEL:  Female CBC Recent Labs  Lab 07/01/18 0548  WBC 9.1  HGB 7.8*  HCT 23.7*  PLT 128*   ------------------------------------------------------------------------------------------------------------------ Chemistries  Recent Labs  Lab 06/28/18 0151  07/01/18 0548  NA 136   < > 141  K 3.9   < > 3.7  CL 103   < > 113*  CO2 24   < > 20*  GLUCOSE 117*   < > 80  BUN 36*   < > 15  CREATININE 1.49*   < > 1.08*  CALCIUM 9.2   < > 8.6*  MG  --    < > 1.6*  AST 22  --   --   ALT 13  --   --   ALKPHOS 67  --   --  BILITOT 0.4  --   --    < > = values in this interval not displayed.   RADIOLOGY:  No results found. ASSESSMENT AND PLAN:    1.  GI bleed: Patient presented with melena with some intermittent hematochezia.  Has had multiple maroon-colored bleeding on presentation. I discussed case with gastroenterologist on-call initially who recommended bleeding scan since patient not prepped for colonoscopy. Bleeding scan came back positive.  Discussed case with interventional radiologist Dr. Deanne Coffer and patient subsequently had mesenteric angiogram  done.  Bleeding vessel was not found. Patient did develop perforation of R CFA retroperitoneal branch, with hematoma, controlled with coil embolization by interventional radiologist. Does have chronic right inguinal hernia. Due to dropping hemoglobin to 7.6 previously I discussed case with gastroenterologist Dr. Wyline Mood.  Patient clinically did not have any evidence of ongoing GI bleed.  He recommended discussing case with interventional radiologist.  I spoke with the interventional radiologist on-call Dr. Lowella Dandy who reviewed the images and also discussed with Dr. Deanne Coffer that performed the procedure.  He confirmed the size of the hematoma on imaging is smaller than previously and does not appear to have any evidence of any ongoing active GI bleed.  Bleeding was likely diverticular in origin.  Plan is to manage conservatively. Hemoglobin improved slightly to 7.8 this morning.  No evidence of bleeding reported overnight. Patient tolerating clear liquids diet which was advanced to full liquid. I was later called in the afternoon that patient had one episode of small blood noticed from her rectum.  Repeat stat hemoglobin requested.  If further drop in hemoglobin we will transfuse packed red blood cell and manage conservatively.  Patient as well as family members present at bedside updated and agree with treatment approach. Patient remains hemodynamically stable at this time.  2.  Hypertension: Blood pressure meds initially placed on hold due to low blood pressure on presentation  Blood pressure already trending up.  Resume home meds including atenolol   3.  Hypothyroidism: TSH normal.  Continue Synthroid  4.  CHF: Chronic; diastolic.  Stable.  5.  Episode of V. tach today. Resolved at the time of my evaluation.  Twelve-lead EKG revealed paced rhythm.  Resumed home dose of atenolol.  Hypomagnesemia which was replaced today. Cardiology already consulted and following patient.  Discussed case with him  and he agrees with resumption of atenolol.  Appreciate input.   DVT prophylaxis: SCDs No heparin products due to GI bleed  All the records are reviewed and case discussed with Care Management/Social Worker. Management plans discussed with the patient, family and they are in agreement.  CODE STATUS: Full Code  TOTAL TIME TAKING CARE OF THIS PATIENT: .   More than 50% of the time was spent in counseling/coordination of care: YES  POSSIBLE D/C IN 1-2DAYS, DEPENDING ON CLINICAL CONDITION.   Schneur Crowson M.D on 07/01/2018 at 2:08 PM  Between 7am to 6pm - Pager - [phone]  After 6pm go to [email or web address] - Social research officer, government  [employer] [facility] [place] [facility]  Office  [phone]  CC: Primary care physician; Dorothey Baseman, MD  Note: This dictation was prepared with Dragon dictation along with smaller phrase technology. Any transcriptional errors that result from this process are unintentional.

## 2018-07-01 NOTE — Progress Notes (Signed)
Ch attended to a RRT for pt. Care team worked to attend to pt with VT (rapid heartbeat). Ch checked in w/ pt family members that were present briefly before attending to another code. F/u recommended.      07/01/18 1000  Clinical Encounter Type  Visited With Patient and family together  Visit Type Social support;Code  Referral From Chaplain;Nurse  Consult/Referral To Chaplain  Spiritual Encounters  Spiritual Needs Emotional  Stress Factors  Patient Stress Factors Health changes;Major life changes  Family Stress Factors None identified

## 2018-07-01 NOTE — Progress Notes (Signed)
Notified Dr. Enid Baas of small blood clot when attempting to have BM.  Stat H&H ordered.

## 2018-07-02 LAB — PREPARE RBC (CROSSMATCH)

## 2018-07-02 LAB — BPAM RBC
Blood Product Expiration Date: 202003182359
Blood Product Expiration Date: 202003182359
ISSUE DATE / TIME: 202002241753
Unit Type and Rh: 7300
Unit Type and Rh: 7300

## 2018-07-02 LAB — TYPE AND SCREEN
ABO/RH(D): B POS
ANTIBODY SCREEN: NEGATIVE
Unit division: 0
Unit division: 0

## 2018-07-02 LAB — CBC
HCT: 21.7 % — ABNORMAL LOW (ref 36.0–46.0)
Hemoglobin: 6.9 g/dL — ABNORMAL LOW (ref 12.0–15.0)
MCH: 30.3 pg (ref 26.0–34.0)
MCHC: 31.8 g/dL (ref 30.0–36.0)
MCV: 95.2 fL (ref 80.0–100.0)
Platelets: 133 10*3/uL — ABNORMAL LOW (ref 150–400)
RBC: 2.28 MIL/uL — AB (ref 3.87–5.11)
RDW: 14.4 % (ref 11.5–15.5)
WBC: 9.5 10*3/uL (ref 4.0–10.5)
nRBC: 0 % (ref 0.0–0.2)

## 2018-07-02 LAB — MAGNESIUM: Magnesium: 1.8 mg/dL (ref 1.7–2.4)

## 2018-07-02 MED ORDER — SODIUM CHLORIDE 0.9% IV SOLUTION
Freq: Once | INTRAVENOUS | Status: AC
  Administered 2018-07-02: 13:00:00 via INTRAVENOUS

## 2018-07-02 MED ORDER — SODIUM CHLORIDE 0.9 % IV SOLN
INTRAVENOUS | Status: DC
  Administered 2018-07-02: 22:00:00 via INTRAVENOUS

## 2018-07-02 NOTE — Progress Notes (Signed)
Per Dr. Enid Baas, give 650 mg tylenol PO. Recheck temp in 30 to 45 minutes. If temp is controlled then proceed with second unit transfusion.   Madie Reno, RN

## 2018-07-02 NOTE — Progress Notes (Signed)
PT Cancellation Note  Patient Details Name: Vanessa Lang MRN: [id number] DOB: 18-Mar-1923   Cancelled Treatment:    Reason Eval/Treat Not Completed: Medical issues which prohibited therapy(Labs this date show Hb: 6.9, HCT: 21.7, outside of safe range for OOB assessment. Will re-attempt PT evalaution again at later/date time as patient is more appropriate. )  7:39 AM, 07/02/18 Rosamaria Lints, PT, DPT Physical Therapist - [facility]  [phone] (ASCOM)    Vanessa Lang C 07/02/2018, 7:39 AM

## 2018-07-02 NOTE — Progress Notes (Signed)
[employer] - [place] at [facility]   PATIENT NAME: Vanessa Lang    MR#:  [id number]  DATE OF BIRTH:  [birth year]  SUBJECTIVE:  CHIEF COMPLAINT:   Chief Complaint  Patient presents with  . Rectal Bleeding   Patient initially admitted to the ICU due to ongoing active GI bleed.  Seen by interventional radiologist and had angiogram done but unable to find the source vessel.  No bleeding reported overnight.    No further bleeding reported overnight.  Patient tolerating full liquid diet well.  Noted drop in hemoglobin to 6.9 this morning.  To be transfused with 2 units of packed red blood cells today.   REVIEW OF SYSTEMS:  Review of Systems  Constitutional: Negative for chills and fever.  HENT: Negative for hearing loss and tinnitus.   Eyes: Negative for blurred vision and double vision.  Respiratory: Negative for cough, hemoptysis and sputum production.   Cardiovascular: Negative for chest pain and palpitations.  Gastrointestinal: Negative for abdominal pain, heartburn and nausea.  Genitourinary: Negative for dysuria and urgency.  Musculoskeletal: Negative for myalgias and neck pain.  Skin: Negative for itching and rash.  Neurological: Negative for dizziness and headaches.  Psychiatric/Behavioral: Negative for depression and hallucinations.      DRUG ALLERGIES:   Allergies  Allergen Reactions  . Propranolol Rash   VITALS:  Blood pressure 119/65, pulse 60, temperature 98.4 F (36.9 C), temperature source Oral, resp. rate 18, height 5\' 11"  (1.803 m), weight 61.1 kg, SpO2 98 %. PHYSICAL EXAMINATION:  Physical Exam  Constitutional: She is oriented to person, place, and time. She appears well-developed and well-nourished.  HENT:  Head: Normocephalic and atraumatic.  Eyes: Pupils are equal, round, and reactive to light. Conjunctivae and EOM are normal. Right eye exhibits no discharge.  Neck: Normal range of motion. Neck supple. No tracheal deviation present.    Cardiovascular: Normal rate, regular rhythm and normal heart sounds.  Respiratory: Effort normal and breath sounds normal. She has no wheezes.  GI: Soft. Bowel sounds are normal. There is no abdominal tenderness.  Genitourinary:    Genitourinary Comments: Right groin/inguinal hernia.  Chronic.  Reducible.  No pain.  Nonobstructed. Slight dressing at the site of recent right groin hematoma.   Musculoskeletal: Normal range of motion.        General: No edema.  Neurological: She is alert and oriented to person, place, and time. No cranial nerve deficit.  Skin: Skin is warm. She is not diaphoretic. No erythema.  Psychiatric: She has a normal mood and affect. Her behavior is normal.    LABORATORY PANEL:  Female CBC Recent Labs  Lab 07/02/18 0435  WBC 9.5  HGB 6.9*  HCT 21.7*  PLT 133*   ------------------------------------------------------------------------------------------------------------------ Chemistries  Recent Labs  Lab 06/28/18 0151  07/01/18 0548 07/02/18 0435  NA 136   < > 141  --   K 3.9   < > 3.7  --   CL 103   < > 113*  --   CO2 24   < > 20*  --   GLUCOSE 117*   < > 80  --   BUN 36*   < > 15  --   CREATININE 1.49*   < > 1.08*  --   CALCIUM 9.2   < > 8.6*  --   MG  --    < > 1.6* 1.8  AST 22  --   --   --   ALT 13  --   --   --  ALKPHOS 67  --   --   --   BILITOT 0.4  --   --   --    < > = values in this interval not displayed.   RADIOLOGY:  No results found. ASSESSMENT AND PLAN:    1.  GI bleed: Patient presented with melena with some intermittent hematochezia.  Has had multiple maroon-colored bleeding on presentation. I discussed case with gastroenterologist on-call initially who recommended bleeding scan since patient not prepped for colonoscopy. Bleeding scan came back positive.  Discussed case with interventional radiologist Dr. Deanne Coffer and patient subsequently had mesenteric angiogram done.  Bleeding vessel was not found. Patient did develop  perforation of R CFA retroperitoneal branch, with hematoma, controlled with coil embolization by interventional radiologist. Does have chronic right inguinal hernia. Due to dropping hemoglobin previously I discussed case with gastroenterologist Dr. Wyline Mood.  Patient clinically did not have any evidence of ongoing GI bleed.  He recommended discussing case with interventional radiologist.  I spoke with the interventional radiologist on-call Dr. Lowella Dandy who reviewed the images and also discussed with Dr. Deanne Coffer that performed the procedure.  He confirmed the size of the hematoma on imaging is smaller than previously and does not appear to have any evidence of any ongoing active GI bleed.  Bleeding was likely diverticular in origin.  Plan is to manage conservatively. No evidence of bleeding reported overnight. Hemoglobin however dropped to 6.9.  Being managed conservatively.  Patient to be transfused with 2 units of packed red blood cells today. Patient remains hemodynamically stable at this time.  2.  Hypertension: Resumed some of her home blood pressure medications which were previously held due to low blood pressure on presentation   3.  Hypothyroidism: TSH normal.  Continue Synthroid  4.  CHF: Chronic; diastolic.  Stable.  5.  Episode of V. tach yesterday.  Resolved at the time of my evaluation.  Twelve-lead EKG revealed paced rhythm.  Resumed home dose of atenolol.  Hypomagnesemia which was replaced. Cardiology already consulted and following patient.  Discussed case with him and he agrees with resumption of atenolol.  Appreciate input.   DVT prophylaxis: SCDs No heparin products due to GI bleed  Disposition; updated patient and family members present at bedside.  Anticipate discharge home tomorrow if hemoglobin remained stable.  All the records are reviewed and case discussed with Care M discharge anagement/Social Worker. Management plans discussed with the patient, family and they are in  agreement.  CODE STATUS: Full Code  TOTAL TIME TAKING CARE OF THIS PATIENT: .   More than 50% of the time was spent in counseling/coordination of care: YES  POSSIBLE D/C IN 1DAY , DEPENDING ON CLINICAL CONDITION.   Latara Micheli M.D on 07/02/2018 at 2:52 PM  Between 7am to 6pm - Pager - [phone]  After 6pm go to [email or web address] - Social research officer, government  [facility] [place] [facility]  Office  [phone]  CC: Primary care physician; Dorothey Baseman, MD  Note: This dictation was prepared with Dragon dictation along with smaller phrase technology. Any transcriptional errors that result from this process are unintentional.

## 2018-07-02 NOTE — Progress Notes (Signed)
Per CCMD, patient ST in 130's for 1 minute. Patient presents on issues.  Continue to monitor.   Madie Reno, RN

## 2018-07-02 NOTE — Progress Notes (Signed)
[facility] Cardiology [facility] Encounter Note  Patient: Sulma Ruffino / Admit Date: 06/28/2018 / Date of Encounter: 07/02/2018, 8:24 AM   Subjective: Patient feeling well this morning.  No evidence of chest pain or congestive heart failure symptoms.  No evidence of acute coronary syndrome and or myocardial infarction.  Patient has a normal heart rhythm with pacemaker to previous heart block and bradycardia.  Patient occasionally having preventricular contractions.  And multiple episodes of significant supraventricular tachycardia with heart rate of 150 to 160 bpm having some palpitations.  After reinstatement of atenolol it appears that there is no further event although have not seen all telemetry throughout this morning Review of Systems: Positive for: None Negative for: Vision change, hearing change, syncope, dizziness, nausea, vomiting,diarrhea, bloody stool, stomach pain, cough, congestion, diaphoresis, urinary frequency, urinary pain,skin lesions, skin rashes Others previously listed  Objective: Telemetry: Atrial ventricular pacing with occasional preventricular contractions with yesterday's occasional episodes of supraventricular tachycardia Physical Exam: Blood pressure 108/61, pulse 66, temperature 98.1 F (36.7 C), temperature source Oral, resp. rate 18, height  (1.803 m), weight 61.1 kg, SpO2 97 %. Body mass index is 18.8 kg/m. General: Well developed, well nourished, in no acute distress. Head: Normocephalic, atraumatic, sclera non-icteric, no xanthomas, nares are without discharge. Neck: No apparent masses Lungs: Normal respirations with no wheezes, no rhonchi, no rales , few crackles   Heart: Regular rate and rhythm, normal S1 soft S2, 4+ aortic murmur, no rub, no gallop, PMI is normal size and placement, carotid upstroke normal with bruit, jugular venous pressure normal Abdomen: Soft, non-tender, non-distended with normoactive bowel sounds. No hepatosplenomegaly.  Abdominal aorta is normal size without bruit Extremities: No edema, no clubbing, no cyanosis, no ulcers,  Peripheral: 2+ radial, 2+ femoral, 2+ dorsal pedal pulses Neuro: Alert and oriented. Moves all extremities spontaneously. Psych:  Responds to questions appropriately with a normal affect.   Intake/Output Summary (Last 24 hours) at 07/02/2018 0824 Last data filed at 07/02/2018 0500 Gross per 24 hour  Intake 920 ml  Output 200 ml  Net 720 ml    Inpatient Medications:  . sodium chloride   Intravenous Once  . amLODipine  5 mg Oral Daily  . atenolol  50 mg Oral Daily  . donepezil  5 mg Oral Daily  . escitalopram  5 mg Oral Daily  . feeding supplement (ENSURE ENLIVE)  237 mL Oral BID BM  . ferrous sulfate  325 mg Oral BID WC  . gabapentin  100 mg Oral TID  . levothyroxine  75 mcg Oral Daily  . multivitamin with minerals   Oral Daily  . pantoprazole (PROTONIX) IV  40 mg Intravenous Q12H   Infusions:   Labs: Recent Labs    06/30/18 0614 07/01/18 0548 07/02/18 0435  NA 143 141  --   K 3.8 3.7  --   CL 114* 113*  --   CO2 22 20*  --   GLUCOSE 81 80  --   BUN 20 15  --   CREATININE 1.25* 1.08*  --   CALCIUM 8.3* 8.6*  --   MG 1.7 1.6* 1.8   No results for input(s): AST, ALT, ALKPHOS, BILITOT, PROT, ALBUMIN in the last 72 hours. Recent Labs    07/01/18 0548 07/01/18 1346 07/02/18 0435  WBC 9.1  --  9.5  HGB 7.8* 8.3* 6.9*  HCT 23.7* 25.3* 21.7*  MCV 93.7  --  95.2  PLT 128*  --  133*   No results for  input(s): CKTOTAL, CKMB, TROPONINI in the last 72 hours. Invalid input(s): POCBNP No results for input(s): HGBA1C in the last 72 hours.   Weights: Filed Weights   06/28/18 1728 06/30/18 0054 07/01/18 0500  Weight: 60.2 kg 62.5 kg 61.1 kg     Radiology/Studies:  Ct Abdomen Pelvis Wo Contrast  Result Date: 06/30/2018 CLINICAL DATA:  Status post recent angiogram with groin hematoma and drop in hemoglobin. EXAM: CT ABDOMEN AND PELVIS WITHOUT CONTRAST TECHNIQUE:  Multidetector CT imaging of the abdomen and pelvis was performed following the standard protocol without IV contrast. COMPARISON:  06/28/2018 FINDINGS: Lower chest: Small left pleural effusion. Minimal overlying atelectasis. No worrisome pulmonary lesions. Stable mild cardiac enlargement and vascular calcifications. Hepatobiliary: No focal hepatic lesion or intrahepatic biliary dilatation. The gallbladder contains contrast likely due to vicarious excretion. Pancreas: No mass, inflammation or ductal dilatation. Spleen: Normal size.  No focal lesions. Adrenals/Urinary Tract: Stable nodularity of both adrenal glands. No discrete mass. Possible nodular hyperplasia or small adenomas. Again demonstrated are numerous bilateral simple and complex renal cysts. Several of these demonstrate dense calcifications and some cysts are hyperdense/hemorrhagic. I do not see any worrisome lesions without contrast and I do not see any significant change since prior CT scan from 2016. No hydronephrosis. The bladder contains contrast and appears normal. No asymmetric bladder wall thickening or mass. Stomach/Bowel: Stable large right inguinal hernia containing numerous small bowel loops and evidence of surgical anastomosis. The colon demonstrates extensive diverticulosis but no definite findings for acute diverticulitis. Vascular/Lymphatic: Extensive atherosclerotic calcifications involving the aorta and iliac arteries. Stable branch vessel calcifications also. No mesenteric or retroperitoneal mass or adenopathy. Reproductive: Surgically absent. Other: There is a small right groin hematoma contiguous with a small/moderate sized right-sided extraperitoneal pelvic hematoma. No mass effect on the bladder appeared presacral edema/fluid is also noted. Musculoskeletal: Stable degenerative changes involving the spine and hips. No acute bony findings or destructive bony lesions. IMPRESSION: 1. Small right groin hematoma contiguous with a small to  moderate extraperitoneal right-sided pelvic hematoma. 2. Stable large right inguinal hernia containing numerous small bowel loops but no findings for obstruction or incarceration. 3. Numerous complex renal cysts bilaterally but no obvious worrisome lesion. 4. Diffuse colonic diverticulosis but no findings for acute diverticulitis. 5. Advanced atherosclerotic calcifications involving the aorta, iliac arteries and branch vessels. 6. Small left pleural effusion. Electronically Signed   By: Rudie Meyer M.D.   On: 06/30/2018 11:22   Nm Gi Blood Loss  Result Date: 06/28/2018 CLINICAL DATA:  GI bleed. Started 1030 last night. Prior colectomy. EXAM: NUCLEAR MEDICINE GASTROINTESTINAL BLEEDING SCAN TECHNIQUE: Sequential abdominal images were obtained following intravenous administration of Tc-92m labeled red blood cells. RADIOPHARMACEUTICALS:  21.459 mCi Tc-78m pertechnetate in-vitro labeled red cells. COMPARISON:  None. FINDINGS: Progressively increasing and peristalsing radiotracer in the left upper abdomen extending into the left lower abdomen. Normal physiologic biodistribution of the radiotracer is demonstrated throughout the abdomen. IMPRESSION: Progressively increasing and peristalsing radiotracer in the left upper abdomen extending into the left lower abdomen. Findings are consistent with active gastrointestinal bleeding in the splenic flexure region versus small bowel. Critical Value/emergent results were called by telephone at the time of interpretation on 06/28/2018 at 1:30 pm to Dr. Jama Flavors , who verbally acknowledged these results. Electronically Signed   By: Elige Ko   On: 06/28/2018 13:30   Ir Angiogram Visceral Selective  Result Date: 06/29/2018 CLINICAL DATA:  GI bleed, localized to mid descending segment on CTA. EXAM: SELECTIVE VISCERAL ARTERIOGRAPHY PROCEDURE: The  procedure, risks (including but not limited to bleeding, infection, organ damage ), benefits, and alternatives were explained to  the patient, and sister on the phone. Questions regarding the procedure were encouraged and answered. The patient understands and consents to the procedure. Bilateral femoral regions prepped and draped in usual sterile fashion. Maximal barrier sterile technique was utilized including caps, mask, sterile gowns, sterile gloves, sterile drape, hand hygiene and skin antiseptic. The right common femoral artery was localized under ultrasound. Under real-time ultrasound guidance, with medial displacement of her inguinal hernia, the vessel was accessed with a 21-gauge micropuncture needle from a lateral approach. A 018 guidewire advanced easily. Needle was exchanged for a transitional dilator, and with advancement some resistance was encountered. The guidewire and inner dilator were removed and small contrast injection under fluoroscopy demonstrated extraluminal contrast in the extraperitoneal space lateral to the urinary bladder. The dilator was withdrawn until the tip was in the lumen of the common femoral artery. Injection showed continued extravasation from a small muscular branch medial to the distal common femoral artery. Contralateral access was required to manage the right common femoral branch extravasation. Therefore, the left common femoral artery was localized under ultrasound, patency confirmed and documented. Under real-time ultrasound guidance, the vessel was accessed with a 21-gauge micropuncture needle, exchanged for a transitional dilator. Through this, a Bentson wire was advanced centrally to the aorta. Over this, a 5 French sheath was placed, through which the 5 [place] RIM catheter was advanced across the aortic bifurcation for right pelvic arteriography. This demonstrated continued extravasation from the common femoral branch. The catheter and sheath were removed over an angled stiff glidewire exchanged for a 7 French sheath, through which 7 [place] Fogarty occlusion balloon was advanced over the wire to  the distal external iliac artery in the balloon gently inflated until occlusive. A coaxial microcatheter was advanced through the Fogarty catheter and with the aid of a 016 guidewire the arterial branch of concern was catheterized, and a 7 mm Ruby coil placed at its origin. Injection through the occlusion balloon catheter showed no further extravasation. The balloon was deflated. Arteriogram through the catheter showed good flow distally through the right common femoral artery, no further extravasation, good position of the coil. The Fogarty catheter was exchanged over guidewire for a rim catheter, and attempts to engage the origin of the IMA were attempted but ultimately unsuccessful. For this reason, the rim was exchanged for a 5 French pigtail catheter for oblique distal aortography demonstrating apparent occlusion and nonvisualization of the inferior mesenteric artery. The pigtail catheter was then exchanged for a C2 catheter, used to selectively catheterize the superior mesenteric artery for selective arteriography in multiple projections. As no extravasation was identified, attention was then turned back to the expected region of the inferior mesenteric artery. The 5 French pigtail was placed again in the distal abdominal aorta for flush AP aortography. Still the inferior mesenteric artery could not be localized. Given the patient's renal insufficiency , known distal site of the bleed on recent CTA, and apparent celiac origin occlusion on recent CTA, celiac arteriography was not attempted. The catheter and sheath were removed and hemostasis achieved with the aid of the StarClose device after confirmatory femoral arteriography. The patient tolerated the procedure well. COMPLICATIONS: Extravasation of from muscular branch of the right common femoral artery, successfully treated with endovascular coil embolization. SIR level B: Nominal therapy (including overnight admission for observation), no consequence.  ANESTHESIA/SEDATION: Intravenous Fentanyl and Versed were administered as conscious sedation during continuous  cardiorespiratory monitoring by the radiology RN, with a total moderate sedation time of 56 minutes. MEDICATIONS: Lidocaine 1% subcutaneous CONTRAST:  65mL ISOVUE-300 IOPAMIDOL (ISOVUE-300) INJECTION 61% FINDINGS: The active extravasation seen in the mid descending colon on recent CTA was not identified. 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 No other mesenteric vascular abnormality identified. Access site complication with resultant extravasation from a muscular branch of the right common femoral artery was identified and controlled with coil embolization. IMPRESSION: 1. Negative mesenteric arteriogram. No evidence of active extravasation. 2. 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 Electronically Signed   By: Corlis Leak M.D.   On: 06/29/2018 12:10   Ct Angio Abd/pel W/ And/or W/o  Result Date: 06/28/2018 CLINICAL DATA:  Lower GI bleed. Currently hemodynamically stable. History of partial colectomy. Positive red blood cell scintigraphy with evidence of active extravasation in the left mid abdomen, indeterminate whether small bowel versus colon. EXAM: CTA ABDOMEN AND PELVIS wITHOUT AND WITH CONTRAST TECHNIQUE: Multidetector CT imaging of the abdomen and pelvis was performed using the standard protocol during bolus administration of intravenous contrast. Multiplanar reconstructed images and MIPs were obtained and reviewed to evaluate the vascular anatomy. CONTRAST:  43mL OMNIPAQUE IOHEXOL 350 MG/ML SOLN COMPARISON:  08/03/2014 FINDINGS: VASCULAR Coronary calcifications. Aorta: Ectatic up to 3 cm diameter. Moderate calcified plaque. No dissection or stenosis. Celiac: Short-segment origin occlusion or high-grade stenosis, reconstituted distally. SMA: Patent. Classic distal branch anatomy. Renals: Single left, with partially calcified ostial plaque resulting in short segment mild  stenosis. Single right, patent. IMA: High-grade origin stenosis, patent but diminutive distally. Inflow: Scattered calcified plaque through the common and internal iliac arteries. Mild tortuosity of the iliac arterial system without aneurysm or stenosis. Proximal Outflow: Bilateral common femoral and visualized portions of the superficial and profunda femoral arteries are patent without evidence of aneurysm, dissection, vasculitis or significant stenosis. Veins: Patent patent veins, portal vein, SMV, bilateral renal veins. No venous pathology identified on this arterial phase study. Review of the MIP images confirms the above findings. NON-VASCULAR Lower chest: Transvenous pacing leads partially visualized. Coarse aortic leaflet calcifications. No pleural or pericardial effusion. Visualized lung bases clear. Hepatobiliary: No focal liver abnormality is seen. No gallstones, gallbladder wall thickening, or biliary dilatation. Pancreas: Unremarkable. No pancreatic ductal dilatation or surrounding inflammatory changes. Spleen: Normal in size without focal abnormality. Adrenals/Urinary Tract: Unremarkable adrenals. Innumerable bilateral renal lesions many measuring near fluid attenuation consistent with simple cysts, others more hyperdense, others coarsely peripherally calcified. No hydronephrosis. Urinary bladder is distended. Stomach/Bowel: Stomach is decompressed. Small bowel is nondilated. Multiple small bowel loops are involved in a large right inguinal hernia. No evidence of obstruction or strangulation. Anastomotic staple line in small bowel within the hernia sac. The appendix is not discretely identified. Innumerable diverticula throughout the colon. There is ACTIVE ARTERIAL EXTRAVASATION into the mid descending colon, with accumulation of contrast with of the mid descending colon on the delayed scan. Lymphatic: No abdominal or pelvic adenopathy. Reproductive: Status post hysterectomy. No adnexal masses. Other:  No ascites. No free air. Musculoskeletal: Multilevel spondylitic changes in the lower lumbar spine with grade 1 anterolisthesis L3-4, L4-5. No fracture or worrisome bone lesion. IMPRESSION: VASCULAR 1. POSITIVE for active arterial extravasation into the mid descending colon, corresponding to findings on recent scintigraphy. 2. Origin occlusion of the celiac axis 3. Coronary and aortoiliac  atherosclerosis (ICD10-170.0). NON-VASCULAR 1. Extensive colonic diverticulosis. 2. Large right inguinal hernia containing multiple small bowel loops without obstruction or strangulation. 3. Multiple bilateral renal lesions, some  of which are hyperdense and others are incompletely characterized. Electronically Signed   By: Corlis Leak M.D.   On: 06/28/2018 17:26     Assessment and Recommendation  85 y.o. female with moderate aortic valve stenosis and normal LV systolic function with chronic kidney disease stage III and complete heart block with appropriate pacing and occasional preventricular contractions with significant GI bleed and no current evidence of myocardial infarction or anginal symptoms 1.  Proceed with further work-up and treatment of GI bleed including any procedures required due to patient currently at lowest risk possible 2.  No further cardiac intervention or diagnostics necessary at this time due to no evidence of acute coronary syndrome or congestive heart failure 3.  No further intervention of pacemaker working well 4.  No further intervention of aortic stenosis stable at this time 5.  Continue atenolol at current dose for treatment of supraventricular tachycardia continuing to watch rhythm with and without activities and ambulation and would consider increasing dose as necessary 6.  Call if further questions  Signed, Arnoldo Hooker M.D. FACC

## 2018-07-02 NOTE — Care Management Important Message (Signed)
Copy of signed Medicare IM left with patient in room. 

## 2018-07-02 NOTE — Progress Notes (Signed)
Paged Dr. Enid Baas. Patient's temp 100.3 post transfusion of one unit.   Madie Reno, RN

## 2018-07-03 LAB — TYPE AND SCREEN
ABO/RH(D): B POS
Antibody Screen: NEGATIVE
Unit division: 0
Unit division: 0

## 2018-07-03 LAB — BPAM RBC
BLOOD PRODUCT EXPIRATION DATE: 202003272359
Blood Product Expiration Date: 202003182359
ISSUE DATE / TIME: 202002281227
ISSUE DATE / TIME: 202002282232
Unit Type and Rh: 7300
Unit Type and Rh: 7300

## 2018-07-03 LAB — CBC
HCT: 29 % — ABNORMAL LOW (ref 36.0–46.0)
HEMATOCRIT: 32.8 % — AB (ref 36.0–46.0)
HEMOGLOBIN: 9.7 g/dL — AB (ref 12.0–15.0)
Hemoglobin: 10.7 g/dL — ABNORMAL LOW (ref 12.0–15.0)
MCH: 30.3 pg (ref 26.0–34.0)
MCH: 30.3 pg (ref 26.0–34.0)
MCHC: 33.4 g/dL (ref 30.0–36.0)
MCHC: 32.6 g/dL (ref 30.0–36.0)
MCV: 90.6 fL (ref 80.0–100.0)
MCV: 92.9 fL (ref 80.0–100.0)
Platelets: 136 10*3/uL — ABNORMAL LOW (ref 150–400)
Platelets: 153 10*3/uL (ref 150–400)
RBC: 3.2 MIL/uL — AB (ref 3.87–5.11)
RBC: 3.53 MIL/uL — ABNORMAL LOW (ref 3.87–5.11)
RDW: 15.3 % (ref 11.5–15.5)
RDW: 15.9 % — ABNORMAL HIGH (ref 11.5–15.5)
WBC: 9.6 10*3/uL (ref 4.0–10.5)
WBC: 10.8 10*3/uL — ABNORMAL HIGH (ref 4.0–10.5)
nRBC: 0 % (ref 0.0–0.2)
nRBC: 0 % (ref 0.0–0.2)

## 2018-07-03 MED ORDER — PREDNISONE 10 MG (21) PO TBPK: ORAL_TABLET | ORAL | 0 refills | Status: DC

## 2018-07-03 NOTE — Discharge Instructions (Signed)
Take a steroid we have prescribed for you in a taper as follows for your wrist inflammation/possible gout flare:  Take 6 tablets (60 mg total) on day 1, 5 tabs (50 mg) day 2, 4 tabs (40 mg) day 3, 3 tabs (30 mg) day 4, 2 tabs (20 mg) day 5, 1 tab (10 mg) day 6.  At home, maintain a daily bowel regimen to prevent constipation as we discussed.  Eat your Activia daily and take your stool softener daily with a big glass of water. It is better to maintain these habits to prevent constipation rather than waiting to start until you are constipated.  Please follow-up with your primary care doctor. We have made an appointment for you.

## 2018-07-03 NOTE — Care Management Note (Signed)
Case Management Note  Patient Details  Name: Vanessa Lang MRN: [id number] Date of Birth: 1923-02-26  Subjective/Objective:   Patient to be discharged per MD order. Orders in place for home health services. CMS Medicare.gov Compare Post Acute Care list reviewed with patient and sister. They were suggested [facility] or [facility] since there is a program they qualify for, the "two week protocol". Referral placed with Rosey Bath at [facility]. No DME needs. Family to transport.  Buddy Duty RN BSN RNCM [phone]                  Action/Plan:   Expected Discharge Date:  07/03/18               Expected Discharge Plan:  Home w Home Health Services  In-House Referral:     Discharge planning Services  CM Consult  Post Acute Care Choice:  Home Health Choice offered to:  Patient, Sibling  DME Arranged:    DME Agency:     HH Arranged:  RN, PT, Nurse's Aide, Social Work [employer] Agency:  [facility] [employer]  Status of Service:  Completed, signed off  If discussed at [employer] [facility] [employer], dates discussed:    Additional Comments:  Edwinna Areola Shamanda Len, RN 07/03/2018, 11:05 AM

## 2018-07-03 NOTE — Discharge Summary (Signed)
[employer] [facility] [place] [facility]   PATIENT NAME: Vanessa Lang    MR#:  [id number]  DATE OF BIRTH:  [birth year]  DATE OF ADMISSION:  06/28/2018   ADMITTING PHYSICIAN: Jama Flavors, MD  DATE OF DISCHARGE: 07/03/2018  PRIMARY CARE PHYSICIAN: Dorothey Baseman, MD   ADMISSION DIAGNOSIS:  Melena [K92.1] Acute GI bleeding [K92.2] Diverticulitis of colon with bleeding [K57.33] GI bleed [K92.2] DISCHARGE DIAGNOSIS:  Active Problems:   GI bleed   Malnutrition of moderate degree  SECONDARY DIAGNOSIS:   Past Medical History:  Diagnosis Date  . Bowel obstruction (HCC)   . CHF (congestive heart failure) (HCC)   . Depression   . Diverticulitis   . Gout   . HLD (hyperlipidemia)   . HTN (hypertension)   . Hypothyroidism    HOSPITAL COURSE:   Vanessa Lang is an 85 y.o. female with past medical history of hypertension, hypothyroidism, CHF and diverticulitis who presented to the emergency department complaining of urgency of defecation and painless bleeding per rectum. She has had diverticulitis in the past but has never had bleeding. She denied lightheadedness or dizziness, shortness of breath or palpitations.  In the emergency department the patient again had grossly bloody stools. Throughout evaluation she remained stable and due to continued GI bleeding the emergency department staff called the hospitalist service for admission, initially admitted to the ICU.  Plan:  1. GI bleed: Patient presented with melena with some intermittent hematochezia.  had multiple maroon-colored bleeding on presentation. Detailed summary of events per Dr.Ojie:  Case discussed with gastroenterologist on-call initially who recommended bleeding scan since patient not prepped for colonoscopy. Bleeding scan came back positive.  Discussed case with interventional radiologist Dr. Deanne Coffer and patient subsequently had mesenteric angiogram done. Bleeding vessel was not found. Patient did  developperforation of R CFA retroperitoneal branch, with hematoma, controlled with coil embolization by interventional radiologist. Does have chronic right inguinal hernia. Patient clinically did not have any evidence of ongoing GI bleed.  Gastroenterologist consulted again for dropping hemoglobin, recommended discussing case with interventional radiologist.  Dr. Enid Baas spoke with the interventional radiologist on-call Dr. Lowella Dandy who reviewed the images and also discussed with Dr. Deanne Coffer that performed the procedure. He confirmed the size of the hematoma on imaging was smaller than previously and does not appear to have any evidence of any ongoing active GI bleed.  Bleeding was likely diverticular in origin.  Plan is to manage conservatively. No evidence of bleeding reported overnight on day of discharge. Hemoglobin however dropped to 6.9 on 07/02/2018. Patient transfused with 2 units of packed red blood cells. Patient remains hemodynamically stable at this time and Hgb last 10.7 on the day of discharge. Stable to discharge per Dr. Sherryll Burger.  Follow-up within 5 days with PCP and repeat CBC  2. Hypertension: Resumed some of her home blood pressure medications which were previously held due to low blood pressure on presentation. Continue home medications.   3. Hypothyroidism: TSH normal. Continue Synthroid  4. CHF: Chronic; diastolic. Stable.  5.  Episode of V. tach 2/27.  Resolved at the time of evaluation by Dr. Enid Baas.  Twelve-lead EKG revealed paced rhythm. Resumed home dose of atenolol.   Hypomagnesemia which was replaced. Cardiology already consulted and following patient. Discussed case with him and he agrees with resumption of atenolol. Appreciate input.  6. Gout flare, L wrist gout flare with potential exacerbation secondary to prior unsuccessful IV placement. Prednisone taper ordered x 6 days. Follow-up with PCP as per  above.   DVT prophylaxis: SCDs while admitted  No heparin products due  to GI bleed while admitted.  DISCHARGE CONDITIONS:  stable CONSULTS OBTAINED:  Treatment Team:  Jama Flavorsjie, Jude, MD Lamar BlinksKowalski, Bruce J, MD DRUG ALLERGIES:   Allergies  Allergen Reactions  . Propranolol Rash   DISCHARGE MEDICATIONS:   Allergies as of 07/03/2018      Reactions   Propranolol Rash      Medication List    TAKE these medications   ALPRAZolam 0.25 MG tablet Commonly known as:  XANAX Take 0.25 mg by mouth daily as needed for anxiety.   amLODipine 5 MG tablet Commonly known as:  NORVASC Take 1 tablet by mouth daily.   aspirin EC 81 MG tablet Take 81 mg by mouth daily.   atenolol 50 MG tablet Commonly known as:  TENORMIN Take 1 tablet by mouth daily.   donepezil 5 MG tablet Commonly known as:  ARICEPT Take 1 tablet by mouth daily.   escitalopram 5 MG tablet Commonly known as:  LEXAPRO Take 5 mg by mouth daily. For 90 days   ferrous sulfate 325 (65 FE) MG tablet Take 1 tablet (325 mg total) by mouth 2 (two) times daily with a meal.   gabapentin 100 MG capsule Commonly known as:  NEURONTIN Take 1 capsule by mouth 3 (three) times daily.   hydrochlorothiazide 25 MG tablet Commonly known as:  HYDRODIURIL Take 1 tablet by mouth daily.   isosorbide mononitrate 30 MG 24 hr tablet Commonly known as:  IMDUR Take 1 tablet by mouth daily.   lisinopril 40 MG tablet Commonly known as:  PRINIVIL,ZESTRIL Take 1 tablet by mouth daily.   MULTI-VITAMIN DAILY PO Take 1 tablet by mouth daily.   pantoprazole 40 MG tablet Commonly known as:  PROTONIX Take 40 mg by mouth daily.   predniSONE 10 MG (21) Tbpk tablet Commonly known as:  STERAPRED UNI-PAK 21 TAB 6 tablets (60 mg) on day 1, 5 tabs (50 mg) day 2, 4 tabs (40 mg) day 3, 3 tabs (30 mg) day 4, 2 tabs (20 mg) day 5, 1 tab (10 mg) day 6.   SYNTHROID 75 MCG tablet Generic drug:  levothyroxine Take 1 tablet by mouth daily.        DISCHARGE INSTRUCTIONS:   DIET:  Cardiac diet DISCHARGE CONDITION:    Stable ACTIVITY:  Activity as tolerated OXYGEN:  Home Oxygen: No.  Oxygen Delivery: room air DISCHARGE LOCATION:  home with services: outpatient palliative care, HH PT, HH RN, [facility]   If you experience worsening of your admission symptoms, develop shortness of breath, life threatening emergency, suicidal or homicidal thoughts you must seek medical attention immediately by calling 911 or calling your MD immediately if your symptoms are severe.  You Must read complete instructions/literature along with all the possible adverse reactions/side effects for all the medicines you take and that have been prescribed to you. Take any new medicines only after you have completely understood and accept all the possible adverse reactions/side effects.   Please note  You were cared for by a hospitalist during your hospital stay. If you have any questions about your discharge medications or the care you received while you were in the hospital after you are discharged, you can call the unit and asked to speak with the hospitalist on call if the hospitalist that took care of you is not available. Once you are discharged, your primary care physician will handle any further medical issues.  Please note that NO REFILLS for any discharge medications will be authorized once you are discharged, as it is imperative that you return to your primary care physician (or establish a relationship with a primary care physician if you do not have one) for your aftercare needs so that they can reassess your need for medications and monitor your lab values.    On the day of Discharge:  VITAL SIGNS:  Blood pressure 135/72, pulse 65, temperature 98.7 F (37.1 C), temperature source Oral, resp. rate 15, height 5\' 11"  (1.803 m), weight 61.1 kg, SpO2 96 %. PHYSICAL EXAMINATION:  GENERAL:  85 y.o.-year-old patient lying in the bed with no acute distress.  EYES: Pupils equal, round, reactive to light and  accommodation. No scleral icterus. Extraocular muscles intact.  HEENT: Head atraumatic, normocephalic. Oropharynx and nasopharynx clear.  NECK:  Supple, no jugular venous distention. No thyroid enlargement, no tenderness.  LUNGS: Normal breath sounds bilaterally, no wheezing, rales,rhonchi or crepitation. No use of accessory muscles of respiration.  CARDIOVASCULAR: S1, S2 normal. No murmurs, rubs, or gallops.  ABDOMEN: Soft, non-tender, non-distended. Bowel sounds present. No organomegaly or mass.  EXTREMITIES: No pedal edema, cyanosis, or clubbing.  NEUROLOGIC: Cranial nerves II through XII are intact. Muscle strength 5/5 in all extremities. Sensation intact. Gait not checked.  PSYCHIATRIC: The patient is alert and oriented x 3.  SKIN: No obvious rash, lesion, or ulcer.  DATA REVIEW:   CBC Recent Labs  Lab 07/03/18 0936  WBC 10.8*  HGB 10.7*  HCT 32.8*  PLT 153    Chemistries  Recent Labs  Lab 06/28/18 0151  07/01/18 0548 07/02/18 0435  NA 136   < > 141  --   K 3.9   < > 3.7  --   CL 103   < > 113*  --   CO2 24   < > 20*  --   GLUCOSE 117*   < > 80  --   BUN 36*   < > 15  --   CREATININE 1.49*   < > 1.08*  --   CALCIUM 9.2   < > 8.6*  --   MG  --    < > 1.6* 1.8  AST 22  --   --   --   ALT 13  --   --   --   ALKPHOS 67  --   --   --   BILITOT 0.4  --   --   --    < > = values in this interval not displayed.    RADIOLOGY:  No results found.   Management plans discussed with the patient and/or family and they are in agreement.  CODE STATUS: DNR   TOTAL TIME TAKING CARE OF THIS PATIENT: 45 minutes.    Stormy Fabian PA-C on 07/03/2018 at 4:01 PM  Between 7am to 6pm - Pager - [phone]  After 6pm go to [email or web address] - Social research officer, government  [employer] [facility]  Office  [phone]  CC: Primary care physician; Dorothey Baseman, MD

## 2018-07-03 NOTE — Progress Notes (Signed)
Per CCMD patient had a 5 beat run of VT. Patient is asymptomatic. Will continue to monitor.   Madie Reno, RN

## 2018-07-03 NOTE — Evaluation (Signed)
Physical Therapy Evaluation Patient Details Name: Vanessa Lang MRN: [id number] DOB: 12-Jun-1922 Today's Date: 07/03/2018   History of Present Illness  Pt is 85 y.o female that presented to the ED with melena, rectal pressure, and rectal bleeding. Pt transferred to ICU due to positive bleed scan where she also had bouts of Vtach. Mesenteric angiogram unable to identify source of bleed and determined no active bleeding present. Per chart review bleed suspected to be of diverticular origin and coli embolization performed on perforation of R CFA retroperitoneal branch. On 2/25 Hgb 6.9 and pt recieved 2 units of blood. On 2/26 PVC and tachycardia noted, atenolol given to address tachy. On 2/28 Hgb 6.9 and pt receieved 2 units. PMHc of hypothyroidism, HTN, gout, CHF, pacemaker, and diverticulitis.     Clinical Impression  Vanessa Lang admitted for the above and presents with the following deficits listed below (see PT problem list). Pt and pt sister who was present for evaluation endorse she is able to have 24/7 care if need be. Supervision for sup>sit. Pt reported 10/10 dizzy, see bed mobility comments. Mod A provided for initial STS. Min guard for ambulation to [facility] in bathroom. Min guard provided for second STS. Verbal and tactile cues provided for proper and safe RW management as pt tended to let it get too far ahead of her. SPT recommending HHPT upon discharge to address muscle weakness and balance impairments.     Follow Up Recommendations Home health PT;Other (comment)(supervision for stairs )    Equipment Recommendations  None recommended by PT    Recommendations for Other Services       Precautions / Restrictions Precautions Precautions: Fall Precaution Comments: HR, BP Restrictions Weight Bearing Restrictions: No      Mobility  Bed Mobility Overal bed mobility: Needs Assistance Bed Mobility: Supine to Sit     Supine to sit: Supervision;HOB elevated     General bed mobility  comments: SPT supervision for sup>sit. No unsteadiness noted. Pt reported 10/10 dizziness when at EOB, BP 143/77. Following rest break pt reported dizziness decreased.  Transfers Overall transfer level: Needs assistance Equipment used: Rolling walker (2 wheeled) Transfers: Sit to/from Stand Sit to Stand: Mod assist         General transfer comment: Mod A provided for first STS. Min guard provided for STS from [facility]. Pt reported mild dizziness in standing, BP 144/76. Dizziness dissipated following 1 min standing rest break.  Ambulation/Gait Ambulation/Gait assistance: Min guard Gait Distance (Feet): 35 Feet Assistive device: Rolling walker (2 wheeled) Gait Pattern/deviations: Step-through pattern;Decreased step length - right;Decreased step length - left;Decreased dorsiflexion - right;Decreased dorsiflexion - left;Narrow base of support(R foot pronated ) Gait velocity: decreased    General Gait Details: Pt with excessive pronation of R foot with gait. No knee buckling noted, mildly unsteady with turns and multitasking. Pt tended to let RW get too far ahead of her, max verbal and tactile cues provided for proper safety. Pt denied dizziness with ambulation.   Stairs            Wheelchair Mobility    Modified Rankin (Stroke Patients Only)       Balance Overall balance assessment: Needs assistance Sitting-balance support: No upper extremity supported;Feet supported Sitting balance-Leahy Scale: Good Sitting balance - Comments: Pt without use of UE in sitting, if moderately challenged suspected to lose balance.   Standing balance support: Bilateral upper extremity supported;During functional activity Standing balance-Leahy Scale: Poor Standing balance comment: BUE support of RW required.  Pertinent Vitals/Pain Pain Assessment: No/denies pain(no signs of pain)    Home Living Family/patient expects to be discharged to:: Private  residence Living Arrangements: Alone Available Help at Discharge: Family;Friend(s);Available 24 hours/day Type of Home: House Home Access: Stairs to enter Entrance Stairs-Rails: Right Entrance Stairs-Number of Steps: 1 Home Layout: One level Home Equipment: Walker - 2 wheels;Walker - standard;Bedside commode;Shower seat;Grab bars - tub/shower;Hand held shower head;Wheelchair - manual      Prior Function Level of Independence: Needs assistance   Gait / Transfers Assistance Needed: Pt denies any falls in the past 6 months, no longer driving, and is able to ambulate community distances with 1 person hand held assist.   ADL's / Homemaking Assistance Needed: Pt able to dress and cook independently. Minimal assistance required for bathing and cleaning. Pt reports she has a family friend do most of her shopping.        Hand Dominance   Dominant Hand: Right    Extremity/Trunk Assessment   Upper Extremity Assessment Upper Extremity Assessment: Generalized weakness    Lower Extremity Assessment Lower Extremity Assessment: Generalized weakness    Cervical / Trunk Assessment Cervical / Trunk Assessment: Kyphotic  Communication   Communication: No difficulties  Cognition Arousal/Alertness: Awake/alert Behavior During Therapy: WFL for tasks assessed/performed Overall Cognitive Status: Within Functional Limits for tasks assessed                                        General Comments General comments (skin integrity, edema, etc.): Pt HR responded appropriately to activity. At rest in supine 60 and with STS/gait up to 72. Pt reports typically being dizzy that dissipates with rest breaks.     Exercises     Assessment/Plan    PT Assessment Patient needs continued PT services  PT Problem List Decreased strength;Decreased range of motion;Decreased activity tolerance;Decreased balance;Decreased mobility;Decreased knowledge of use of DME;Decreased safety  awareness;Cardiopulmonary status limiting activity       PT Treatment Interventions DME instruction;Gait training;Stair training;Functional mobility training;Therapeutic activities;Therapeutic exercise;Balance training;Neuromuscular re-education;Patient/family education;Wheelchair mobility training    PT Goals (Current goals can be found in the Care Plan section)  Acute Rehab PT Goals Patient Stated Goal: to go home  PT Goal Formulation: With patient Time For Goal Achievement: 07/17/18 Potential to Achieve Goals: Good    Frequency Min 2X/week   Barriers to discharge        Co-evaluation               AM-PAC PT "6 Clicks" Mobility  Outcome Measure Help needed turning from your back to your side while in a flat bed without using bedrails?: A Little Help needed moving from lying on your back to sitting on the side of a flat bed without using bedrails?: A Little Help needed moving to and from a bed to a chair (including a wheelchair)?: A Little Help needed standing up from a chair using your arms (e.g., wheelchair or bedside chair)?: A Little Help needed to walk in hospital room?: A Little Help needed climbing 3-5 steps with a railing? : A Little 6 Click Score: 18    End of Session Equipment Utilized During Treatment: Gait belt Activity Tolerance: Patient tolerated treatment well;Patient limited by fatigue Patient left: in chair;with call bell/phone within reach;with chair alarm set;with family/visitor present;Other (comment)(MD present ) Nurse Communication: Mobility status;Other (comment)(dizzy, BP, HR ) PT Visit Diagnosis: Muscle weakness (generalized) (  M62.81);Unsteadiness on feet (R26.81)    Time: 0931-1000 PT Time Calculation (min) (ACUTE ONLY): 29 min   Charges:           Emeline Gins, SPT  07/03/2018, 10:24 AM

## 2018-07-03 NOTE — Progress Notes (Signed)
Domingo Mend to be D/C'd Home per MD order.  Discussed prescriptions and follow up appointments with the patient. Prescriptions given to patient, medication list explained in detail. Pt verbalized understanding.  Allergies as of 07/03/2018      Reactions   Propranolol Rash      Medication List    TAKE these medications   ALPRAZolam 0.25 MG tablet Commonly known as:  XANAX Take 0.25 mg by mouth daily as needed for anxiety.   amLODipine 5 MG tablet Commonly known as:  NORVASC Take 1 tablet by mouth daily.   aspirin EC 81 MG tablet Take 81 mg by mouth daily.   atenolol 50 MG tablet Commonly known as:  TENORMIN Take 1 tablet by mouth daily.   donepezil 5 MG tablet Commonly known as:  ARICEPT Take 1 tablet by mouth daily.   escitalopram 5 MG tablet Commonly known as:  LEXAPRO Take 5 mg by mouth daily. For 90 days   ferrous sulfate 325 (65 FE) MG tablet Take 1 tablet (325 mg total) by mouth 2 (two) times daily with a meal.   gabapentin 100 MG capsule Commonly known as:  NEURONTIN Take 1 capsule by mouth 3 (three) times daily.   hydrochlorothiazide 25 MG tablet Commonly known as:  HYDRODIURIL Take 1 tablet by mouth daily.   isosorbide mononitrate 30 MG 24 hr tablet Commonly known as:  IMDUR Take 1 tablet by mouth daily.   lisinopril 40 MG tablet Commonly known as:  PRINIVIL,ZESTRIL Take 1 tablet by mouth daily.   MULTI-VITAMIN DAILY PO Take 1 tablet by mouth daily.   pantoprazole 40 MG tablet Commonly known as:  PROTONIX Take 40 mg by mouth daily.   predniSONE 10 MG (21) Tbpk tablet Commonly known as:  STERAPRED UNI-PAK 21 TAB 6 tablets (60 mg) on day 1, 5 tabs (50 mg) day 2, 4 tabs (40 mg) day 3, 3 tabs (30 mg) day 4, 2 tabs (20 mg) day 5, 1 tab (10 mg) day 6.   SYNTHROID 75 MCG tablet Generic drug:  levothyroxine Take 1 tablet by mouth daily.       Vitals:   07/03/18 0125 07/03/18 0457  BP: (!) 142/70 135/72  Pulse: (!) 59 65  Resp: 16 15  Temp:  (!) 97.5 F (36.4 C) 98.7 F (37.1 C)  SpO2: 98% 96%    Skin clean, dry and intact without evidence of skin break down, no evidence of skin tears noted. IV catheter discontinued intact. Site without signs and symptoms of complications. Dressing and pressure applied. Pt denies pain at this time. No complaints noted.  An After Visit Summary was printed and given to the patient. Patient escorted via WC, and D/C home via private auto.  Madie Reno, RN

## 2018-07-03 NOTE — Progress Notes (Signed)
Family Meeting Note  Advance Directive:yes  Today a meeting took place with the Patient and Sister at bedside.  The following clinical team members were present during this meeting:MD  The following were discussed:Patient's diagnosis: 80 y f with below medical problems admitted for GI bleed. May have gout flare on rt wrist.  Past Medical History:  Diagnosis Date  . Bowel obstruction (HCC)   . CHF (congestive heart failure) (HCC)   . Depression   . Diverticulitis   . Gout   . HLD (hyperlipidemia)   . HTN (hypertension)   . Hypothyroidism     Patient's progosis: < 12 months and Goals for treatment: DNR  Additional follow-up to be provided: Outpt Palliative care - high risk for rebleeding and readmissions  Time spent during discussion:16 mins  Delfino Lovett, MD

## 2019-07-29 ENCOUNTER — Encounter: Payer: Self-pay | Admitting: Emergency Medicine

## 2019-07-29 ENCOUNTER — Emergency Department
Admission: EM | Admit: 2019-07-29 | Discharge: 2019-07-29 | Disposition: A | Discharge status: Home / Self Care | Payer: Federal, State, Local not specified - PPO | Attending: Emergency Medicine | Admitting: Emergency Medicine

## 2019-07-29 ENCOUNTER — Other Ambulatory Visit: Payer: Self-pay

## 2019-07-29 ENCOUNTER — Emergency Department: Payer: Federal, State, Local not specified - PPO

## 2019-07-29 DIAGNOSIS — I110 Hypertensive heart disease with heart failure: Secondary | ICD-10-CM | POA: Diagnosis not present

## 2019-07-29 DIAGNOSIS — Z87891 Personal history of nicotine dependence: Secondary | ICD-10-CM | POA: Diagnosis not present

## 2019-07-29 DIAGNOSIS — I5032 Chronic diastolic (congestive) heart failure: Secondary | ICD-10-CM | POA: Diagnosis not present

## 2019-07-29 DIAGNOSIS — E039 Hypothyroidism, unspecified: Secondary | ICD-10-CM | POA: Insufficient documentation

## 2019-07-29 DIAGNOSIS — R0600 Dyspnea, unspecified: Secondary | ICD-10-CM | POA: Insufficient documentation

## 2019-07-29 DIAGNOSIS — Z79899 Other long term (current) drug therapy: Secondary | ICD-10-CM | POA: Diagnosis not present

## 2019-07-29 DIAGNOSIS — Z7982 Long term (current) use of aspirin: Secondary | ICD-10-CM | POA: Insufficient documentation

## 2019-07-29 DIAGNOSIS — R109 Unspecified abdominal pain: Secondary | ICD-10-CM | POA: Diagnosis present

## 2019-07-29 LAB — COMPREHENSIVE METABOLIC PANEL
ALT: 14 U/L (ref 0–44)
AST: 20 U/L (ref 15–41)
Albumin: 4.5 g/dL (ref 3.5–5.0)
Alkaline Phosphatase: 94 U/L (ref 38–126)
Anion gap: 10 (ref 5–15)
BUN: 31 mg/dL — ABNORMAL HIGH (ref 8–23)
CO2: 24 mmol/L (ref 22–32)
Calcium: 9.4 mg/dL (ref 8.9–10.3)
Chloride: 106 mmol/L (ref 98–111)
Creatinine, Ser: 1.52 mg/dL — ABNORMAL HIGH (ref 0.44–1.00)
GFR calc Af Amer: 33 mL/min — ABNORMAL LOW (ref >60)
GFR calc non Af Amer: 29 mL/min — ABNORMAL LOW (ref >60)
Glucose, Bld: 117 mg/dL — ABNORMAL HIGH (ref 70–99)
Potassium: 4.2 mmol/L (ref 3.5–5.1)
Sodium: 140 mmol/L (ref 135–145)
Total Bilirubin: 0.5 mg/dL (ref 0.3–1.2)
Total Protein: 7.7 g/dL (ref 6.5–8.1)

## 2019-07-29 LAB — CBC WITH DIFFERENTIAL/PLATELET
Abs Immature Granulocytes: 0.02 10*3/uL (ref 0.00–0.07)
Basophils Absolute: 0 10*3/uL (ref 0.0–0.1)
Basophils Relative: 0 %
Eosinophils Absolute: 0.3 10*3/uL (ref 0.0–0.5)
Eosinophils Relative: 4 %
HCT: 35.5 % — ABNORMAL LOW (ref 36.0–46.0)
Hemoglobin: 11.6 g/dL — ABNORMAL LOW (ref 12.0–15.0)
Immature Granulocytes: 0 %
Lymphocytes Relative: 28 %
Lymphs Abs: 2.1 10*3/uL (ref 0.7–4.0)
MCH: 32 pg (ref 26.0–34.0)
MCHC: 32.7 g/dL (ref 30.0–36.0)
MCV: 98.1 fL (ref 80.0–100.0)
Monocytes Absolute: 0.8 10*3/uL (ref 0.1–1.0)
Monocytes Relative: 12 %
Neutro Abs: 4.1 10*3/uL (ref 1.7–7.7)
Neutrophils Relative %: 56 %
Platelets: 158 10*3/uL (ref 150–400)
RBC: 3.62 MIL/uL — ABNORMAL LOW (ref 3.87–5.11)
RDW: 12.6 % (ref 11.5–15.5)
WBC: 7.3 10*3/uL (ref 4.0–10.5)
nRBC: 0 % (ref 0.0–0.2)
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
## 2019-07-29 NOTE — ED Notes (Addendum)
Pt denies SOB/CP currently. Denies fever. State history of abd pain but denies currently. In NAD; laying calmly in bed. Visitor at bedside. Anxious to get home.
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
## 2019-07-29 NOTE — ED Notes (Signed)
Pt alert and sitting calmly in bed. Bed locked low. Rail up. Call bell within reach.

## 2019-08-23 ENCOUNTER — Encounter: Payer: Self-pay | Admitting: Ophthalmology

## 2019-08-23 ENCOUNTER — Other Ambulatory Visit: Payer: Self-pay

## 2019-09-06 DIAGNOSIS — I209 Angina pectoris, unspecified: Secondary | ICD-10-CM

## 2019-09-06 HISTORY — DX: Angina pectoris, unspecified: I20.9

## 2019-09-09 ENCOUNTER — Encounter: Payer: Self-pay | Admitting: Ophthalmology

## 2019-09-13 ENCOUNTER — Encounter: Payer: Self-pay | Admitting: Ophthalmology

## 2019-09-13 ENCOUNTER — Other Ambulatory Visit
Admission: RE | Admit: 2019-09-13 | Discharge: 2019-09-13 | Disposition: A | Discharge status: Home / Self Care | Payer: Federal, State, Local not specified - PPO | Source: Ambulatory Visit | Attending: Ophthalmology | Admitting: Ophthalmology

## 2019-09-13 DIAGNOSIS — Z20822 Contact with and (suspected) exposure to covid-19: Secondary | ICD-10-CM | POA: Insufficient documentation

## 2019-09-13 DIAGNOSIS — Z01812 Encounter for preprocedural laboratory examination: Secondary | ICD-10-CM | POA: Insufficient documentation

## 2019-09-13 LAB — SARS CORONAVIRUS 2 (TAT 6-24 HRS): SARS Coronavirus 2: NEGATIVE

## 2019-09-15 ENCOUNTER — Encounter: Payer: Self-pay | Admitting: Anesthesiology

## 2019-09-15 ENCOUNTER — Ambulatory Visit
Admission: RE | Admit: 2019-09-15 | Discharge: 2019-09-15 | Disposition: A | Discharge status: Home / Self Care | Payer: Federal, State, Local not specified - PPO | Attending: Ophthalmology | Admitting: Ophthalmology

## 2019-09-15 ENCOUNTER — Encounter: Payer: Self-pay | Admitting: Ophthalmology

## 2019-09-15 ENCOUNTER — Other Ambulatory Visit: Payer: Self-pay

## 2019-09-15 ENCOUNTER — Encounter
Admission: RE | Disposition: A | Discharge status: Home / Self Care | Payer: Self-pay | Source: Home / Self Care | Attending: Ophthalmology

## 2019-09-15 DIAGNOSIS — F329 Major depressive disorder, single episode, unspecified: Secondary | ICD-10-CM | POA: Diagnosis not present

## 2019-09-15 DIAGNOSIS — K219 Gastro-esophageal reflux disease without esophagitis: Secondary | ICD-10-CM | POA: Insufficient documentation

## 2019-09-15 DIAGNOSIS — M109 Gout, unspecified: Secondary | ICD-10-CM | POA: Insufficient documentation

## 2019-09-15 DIAGNOSIS — G629 Polyneuropathy, unspecified: Secondary | ICD-10-CM | POA: Diagnosis not present

## 2019-09-15 DIAGNOSIS — I110 Hypertensive heart disease with heart failure: Secondary | ICD-10-CM | POA: Diagnosis not present

## 2019-09-15 DIAGNOSIS — M1990 Unspecified osteoarthritis, unspecified site: Secondary | ICD-10-CM | POA: Diagnosis not present

## 2019-09-15 DIAGNOSIS — E785 Hyperlipidemia, unspecified: Secondary | ICD-10-CM | POA: Diagnosis not present

## 2019-09-15 DIAGNOSIS — Z79899 Other long term (current) drug therapy: Secondary | ICD-10-CM | POA: Insufficient documentation

## 2019-09-15 DIAGNOSIS — Z950 Presence of cardiac pacemaker: Secondary | ICD-10-CM | POA: Diagnosis not present

## 2019-09-15 DIAGNOSIS — Z7982 Long term (current) use of aspirin: Secondary | ICD-10-CM | POA: Insufficient documentation

## 2019-09-15 DIAGNOSIS — E039 Hypothyroidism, unspecified: Secondary | ICD-10-CM | POA: Diagnosis not present

## 2019-09-15 DIAGNOSIS — Z79890 Hormone replacement therapy (postmenopausal): Secondary | ICD-10-CM | POA: Insufficient documentation

## 2019-09-15 DIAGNOSIS — I509 Heart failure, unspecified: Secondary | ICD-10-CM | POA: Diagnosis not present

## 2019-09-15 DIAGNOSIS — Z87891 Personal history of nicotine dependence: Secondary | ICD-10-CM | POA: Diagnosis not present

## 2019-09-15 DIAGNOSIS — H2512 Age-related nuclear cataract, left eye: Secondary | ICD-10-CM | POA: Insufficient documentation

## 2019-09-15 HISTORY — PX: CATARACT EXTRACTION W/PHACO: SHX586

## 2019-09-15 HISTORY — DX: Gastro-esophageal reflux disease without esophagitis: K21.9

## 2019-09-15 HISTORY — DX: Dyspnea, unspecified: R06.00

## 2019-09-15 HISTORY — DX: Polyneuropathy, unspecified: G62.9

## 2019-09-15 HISTORY — DX: Cardiac murmur, unspecified: R01.1

## 2019-09-15 HISTORY — DX: Presence of dental prosthetic device (complete) (partial): Z97.2

## 2019-09-15 HISTORY — DX: Unspecified osteoarthritis, unspecified site: M19.90

## 2019-09-15 HISTORY — DX: Presence of cardiac pacemaker: Z95.0

## 2019-09-15 HISTORY — DX: Endocarditis, valve unspecified: I38

## 2019-09-15 SURGERY — PHACOEMULSIFICATION, CATARACT, WITH IOL INSERTION
Anesthesia: Monitor Anesthesia Care | Site: Eye | Laterality: Left

## 2019-09-15 MED ORDER — TETRACAINE HCL 0.5 % OP SOLN
OPHTHALMIC | Status: AC
  Administered 2019-09-15: 1 [drp] via OPHTHALMIC
  Filled 2019-09-15: qty 4

## 2019-09-15 MED ORDER — LIDOCAINE HCL (PF) 4 % IJ SOLN
INTRAOCULAR | Status: DC | PRN
  Administered 2019-09-15: 2 mL via OPHTHALMIC

## 2019-09-15 MED ORDER — MOXIFLOXACIN HCL 0.5 % OP SOLN: 1.0000 [drp] | Freq: Once | OPHTHALMIC | Status: DC

## 2019-09-15 MED ORDER — MOXIFLOXACIN HCL 0.5 % OP SOLN
OPHTHALMIC | Status: DC | PRN
  Administered 2019-09-15: 0.2 mL via OPHTHALMIC

## 2019-09-15 MED ORDER — NEOMYCIN-POLYMYXIN-DEXAMETH 3.5-10000-0.1 OP OINT
TOPICAL_OINTMENT | OPHTHALMIC | Status: AC
  Filled 2019-09-15: qty 3.5

## 2019-09-15 MED ORDER — TETRACAINE HCL 0.5 % OP SOLN
1.0000 [drp] | Freq: Once | OPHTHALMIC | Status: AC
  Administered 2019-09-15: 1 [drp] via OPHTHALMIC

## 2019-09-15 MED ORDER — ARMC OPHTHALMIC DILATING DROPS
OPHTHALMIC | Status: AC
  Administered 2019-09-15: 1 via OPHTHALMIC
  Filled 2019-09-15: qty 0.5

## 2019-09-15 MED ORDER — LIDOCAINE HCL (PF) 4 % IJ SOLN
INTRAMUSCULAR | Status: AC
  Filled 2019-09-15: qty 5

## 2019-09-15 MED ORDER — MIDAZOLAM HCL 2 MG/2ML IJ SOLN
INTRAMUSCULAR | Status: AC
  Filled 2019-09-15: qty 2

## 2019-09-15 MED ORDER — MIDAZOLAM HCL 2 MG/2ML IJ SOLN
INTRAMUSCULAR | Status: DC | PRN
  Administered 2019-09-15: .5 mg via INTRAVENOUS

## 2019-09-15 MED ORDER — TETRACAINE HCL 0.5 % OP SOLN
OPHTHALMIC | Status: AC
  Filled 2019-09-15: qty 4

## 2019-09-15 MED ORDER — ONDANSETRON HCL 4 MG/2ML IJ SOLN
INTRAMUSCULAR | Status: DC | PRN
  Administered 2019-09-15: 4 mg via INTRAVENOUS

## 2019-09-15 MED ORDER — POVIDONE-IODINE 5 % OP SOLN
OPHTHALMIC | Status: DC | PRN
  Administered 2019-09-15: 1 via OPHTHALMIC

## 2019-09-15 MED ORDER — MOXIFLOXACIN HCL 0.5 % OP SOLN
OPHTHALMIC | Status: AC
  Filled 2019-09-15: qty 3

## 2019-09-15 MED ORDER — ARMC OPHTHALMIC DILATING DROPS
1.0000 "application " | OPHTHALMIC | Status: AC
  Administered 2019-09-15 (×2): 1 via OPHTHALMIC

## 2019-09-15 MED ORDER — TRYPAN BLUE 0.06 % OP SOLN
OPHTHALMIC | Status: AC
  Filled 2019-09-15: qty 0.5

## 2019-09-15 MED ORDER — EPINEPHRINE PF 1 MG/ML IJ SOLN
INTRAOCULAR | Status: DC | PRN
  Administered 2019-09-15: 200 mL via OPHTHALMIC

## 2019-09-15 MED ORDER — NA CHONDROIT SULF-NA HYALURON 40-17 MG/ML IO SOLN
INTRAOCULAR | Status: DC | PRN
  Administered 2019-09-15: 1 mL via INTRAOCULAR

## 2019-09-15 MED ORDER — SODIUM CHLORIDE 0.9 % IV SOLN: INTRAVENOUS | Status: DC

## 2019-09-15 SURGICAL SUPPLY — 17 items
GLOVE BIO SURGEON STRL SZ8 (GLOVE) ×3 IMPLANT
GLOVE BIOGEL M 6.5 STRL (GLOVE) ×3 IMPLANT
GLOVE SURG LX 8.0 MICRO (GLOVE) ×2
GLOVE SURG LX STRL 8.0 MICRO (GLOVE) ×1 IMPLANT
GOWN STRL REUS W/ TWL LRG LVL3 (GOWN DISPOSABLE) ×2 IMPLANT
GOWN STRL REUS W/TWL LRG LVL3 (GOWN DISPOSABLE) ×4
LABEL CATARACT MEDS ST (LABEL) ×3 IMPLANT
LENS IOL DIOP 19.0 (Intraocular Lens) ×3 IMPLANT
LENS IOL TECNIS MONO 19.0 (Intraocular Lens) IMPLANT
PACK CATARACT (MISCELLANEOUS) ×3 IMPLANT
PACK CATARACT BRASINGTON LX (MISCELLANEOUS) ×3 IMPLANT
PACK EYE AFTER SURG (MISCELLANEOUS) ×3 IMPLANT
SOL BSS BAG (MISCELLANEOUS) ×3
SOLUTION BSS BAG (MISCELLANEOUS) ×1 IMPLANT
SYR 5ML LL (SYRINGE) ×3 IMPLANT
WATER STERILE IRR 250ML POUR (IV SOLUTION) ×3 IMPLANT
WIPE NON LINTING 3.25X3.25 (MISCELLANEOUS) ×3 IMPLANT

## 2019-09-15 NOTE — Discharge Instructions (Addendum)
Eye Surgery Discharge Instructions  Expect mild scratchy sensation or mild soreness. DO NOT RUB YOUR EYE!  The day of surgery: . Minimal physical activity, but bed rest is not required . No reading, computer work, or close hand work . No bending, lifting, or straining. . May watch TV  For 24 hours: . No driving, legal decisions, or alcoholic beverages . Safety precautions . Eat anything you prefer: It is better to start with liquids, then soup then solid foods. . Solar shield eyeglasses should be worn for comfort in the sunlight/patch while sleeping  Resume all regular medications including aspirin or Coumadin if these were discontinued prior to surgery. You may shower, bathe, shave, or wash your hair. Tylenol may be taken for mild discomfort. Follow Dr. Gerome Sam eye drop instruction sheet as reviewed.  Call your doctor if you experience significant pain, nausea, or vomiting, fever > 101 or other signs of infection. [phone] or [phone] Specific instructions:  Follow-up Information    Galen Manila, MD Follow up.   Specialty: Ophthalmology Why: 09/16/19 @ 9:20 am Contact information: [address] [place] [address] [phone]

## 2019-09-15 NOTE — H&P (Signed)
All labs reviewed. Abnormal studies sent to patients PCP when indicated.  Previous H&P reviewed, patient examined, there are NO CHANGES.  Vanessa Dorame Porfilio5/13/20218:09 AM  

## 2019-09-15 NOTE — Transfer of Care (Signed)
Immediate Anesthesia Transfer of Care Note  Patient: Vanessa Lang  Procedure(s) Performed: CATARACT EXTRACTION PHACO AND INTRAOCULAR LENS PLACEMENT (IOC) LEFT (Left Eye)  Patient Location: PACU  Anesthesia Type:MAC  Level of Consciousness: awake, alert  and oriented  Airway & Oxygen Therapy: Patient Spontanous Breathing and Patient connected to nasal cannula oxygen  Post-op Assessment: Report given to RN and Post -op Vital signs reviewed and stable  Post vital signs: Reviewed and stable  Last Vitals:  Vitals Value Taken Time  BP    Temp    Pulse    Resp    SpO2      Last Pain:  Vitals:   09/15/19 0732  TempSrc: Tympanic  PainSc: 0-No pain      Patients Stated Pain Goal: 0 (54/36/06 7703)  Complications: No apparent anesthesia complications

## 2019-09-15 NOTE — Anesthesia Postprocedure Evaluation (Signed)
Anesthesia Post Note  Patient: Alphonsine Minium  Procedure(s) Performed: CATARACT EXTRACTION PHACO AND INTRAOCULAR LENS PLACEMENT ([place]) LEFT (Left Eye)  Patient location during evaluation: Phase II Anesthesia Type: MAC Level of consciousness: awake and alert Pain management: pain level controlled Vital Signs Assessment: post-procedure vital signs reviewed and stable Respiratory status: spontaneous breathing, nonlabored ventilation and respiratory function stable Cardiovascular status: stable and blood pressure returned to baseline Postop Assessment: no apparent nausea or vomiting Anesthetic complications: no     Last Vitals:  Vitals:   09/15/19 0732 09/15/19 0924  BP: (!) 159/85 (!) 150/96  Pulse: 64 61  Resp: 18 16  Temp: (!) 36.4 C (!) 36.4 C  SpO2: 97% 100%    Last Pain:  Vitals:   09/15/19 0924  TempSrc: Temporal  PainSc: 0-No pain                 Alphonsus Sias

## 2019-09-15 NOTE — Anesthesia Preprocedure Evaluation (Signed)
Anesthesia Evaluation  Patient identified by MRN, date of birth, ID band Patient awake    Reviewed: Allergy & Precautions, H&P , NPO status , reviewed documented beta blocker date and time   Airway Mallampati: II  TM Distance: >3 FB Neck ROM: limited    Dental  (+) Partial Upper, Lower Dentures   Pulmonary shortness of breath, former smoker,    Pulmonary exam normal        Cardiovascular hypertension, + angina +CHF  Normal cardiovascular exam+ pacemaker + Valvular Problems/Murmurs      Neuro/Psych PSYCHIATRIC DISORDERS Depression    GI/Hepatic GERD  Controlled,  Endo/Other  Hypothyroidism   Renal/GU Renal disease     Musculoskeletal  (+) Arthritis ,   Abdominal   Peds  Hematology   Anesthesia Other Findings Past Medical History: 09/06/2019: Anginal pain (HCC)     Comment:  last episode 2 months ago No date: Arthritis No date: Bowel obstruction (HCC) No date: CHF (congestive heart failure) (HCC) No date: Depression No date: Diverticulitis No date: Dyspnea No date: GERD (gastroesophageal reflux disease) No date: Gout No date: Gout No date: Heart murmur No date: Heart valve disorder No date: HLD (hyperlipidemia) No date: HTN (hypertension) No date: Hypothyroidism No date: Neuropathy 1/26/201: Presence of permanent cardiac pacemaker     Comment:  [employer]tronic [id number] No date: Wears dentures     Comment:  partial upper, full lower  Past Surgical History: No date: ABDOMINAL HYSTERECTOMY No date: ABDOMINAL SURGERY No date: COLON SURGERY     Comment:  colon resection (for blockage)  with hernia repair No date: HERNIA REPAIR 06/28/2018: IR ANGIOGRAM VISCERAL SELECTIVE No date: PACEMAKER INSERTION No date: THYROIDECTOMY  BMI    Body Mass Index: 19.23 kg/m      Reproductive/Obstetrics                             Anesthesia Physical Anesthesia Plan  ASA: III  Anesthesia Plan:  MAC   Post-op Pain Management:    Induction: Intravenous  PONV Risk Score and Plan: 3 and Treatment may vary due to age or medical condition and TIVA  Airway Management Planned: Nasal Cannula and Natural Airway  Additional Equipment:   Intra-op Plan:   Post-operative Plan:   Informed Consent: I have reviewed the patients History and Physical, chart, labs and discussed the procedure including the risks, benefits and alternatives for the proposed anesthesia with the patient or authorized representative who has indicated his/her understanding and acceptance.     Dental Advisory Given  Plan Discussed with: CRNA  Anesthesia Plan Comments:         Anesthesia Quick Evaluation

## 2019-09-15 NOTE — Op Note (Signed)
PREOPERATIVE DIAGNOSIS:  Nuclear sclerotic cataract of the left eye.   POSTOPERATIVE DIAGNOSIS:  Nuclear sclerotic cataract of the left eye.   OPERATIVE PROCEDURE: Procedure(s): CATARACT EXTRACTION PHACO AND INTRAOCULAR LENS PLACEMENT (IOC) LEFT   SURGEON:  Galen Manila, MD.   ANESTHESIA:  Anesthesiologist: Christia Reading, MD CRNA: Mohammed Kindle, CRNA; Rosanne Gutting, CRNA  1.      Managed anesthesia care. 2.     0.84ml of Shugarcaine was instilled following the paracentesis   COMPLICATIONS:  None.   TECHNIQUE:   Stop and chop   DESCRIPTION OF PROCEDURE:  The patient was examined and consented in the preoperative holding area where the aforementioned topical anesthesia was applied to the left eye and then brought back to the Operating Room where the left eye was prepped and draped in the usual sterile ophthalmic fashion and a lid speculum was placed. A paracentesis was created with the side port blade and the anterior chamber was filled with viscoelastic. A near clear corneal incision was performed with the steel keratome. A continuous curvilinear capsulorrhexis was performed with a cystotome followed by the capsulorrhexis forceps. Hydrodissection and hydrodelineation were carried out with BSS on a blunt cannula. The lens was removed in a stop and chop  technique and the remaining cortical material was removed with the irrigation-aspiration handpiece. The capsular bag was inflated with viscoelastic and the Technis ZCB00 lens was placed in the capsular bag without complication. The remaining viscoelastic was removed from the eye with the irrigation-aspiration handpiece. The wounds were hydrated. The anterior chamber was flushed with Miostat and the eye was inflated to physiologic pressure. 0.65ml Vigamox was placed in the anterior chamber. The wounds were found to be water tight. The eye was dressed with Vigamox. The patient was given protective glasses to wear throughout the day  and a shield with which to sleep tonight. The patient was also given drops with which to begin a drop regimen today and will follow-up with me in one day. Implant Name Type Inv. Item Serial No. Manufacturer Lot No. LRB No. Used Action  LENS IOL DIOP 19.0 - [id number] Intraocular Lens LENS IOL DIOP 19.0 [id number]  Left 1 Implanted    Procedure(s) with comments: CATARACT EXTRACTION PHACO AND INTRAOCULAR LENS PLACEMENT (IOC) LEFT (Left) - Lot # [place]: [id number] CDE: 6.81  Electronically signed: Galen Manila 09/15/2019 9:22 AM

## 2019-10-11 ENCOUNTER — Other Ambulatory Visit
Admission: RE | Admit: 2019-10-11 | Discharge: 2019-10-11 | Disposition: A | Discharge status: Home / Self Care | Payer: Federal, State, Local not specified - PPO | Source: Ambulatory Visit | Attending: Ophthalmology | Admitting: Ophthalmology

## 2019-10-11 ENCOUNTER — Encounter: Payer: Self-pay | Admitting: Ophthalmology

## 2019-10-11 ENCOUNTER — Other Ambulatory Visit: Payer: Self-pay

## 2019-10-11 DIAGNOSIS — Z01812 Encounter for preprocedural laboratory examination: Secondary | ICD-10-CM | POA: Insufficient documentation

## 2019-10-11 DIAGNOSIS — Z20822 Contact with and (suspected) exposure to covid-19: Secondary | ICD-10-CM | POA: Diagnosis not present

## 2019-10-11 LAB — SARS CORONAVIRUS 2 (TAT 6-24 HRS): SARS Coronavirus 2: NEGATIVE

## 2019-10-12 NOTE — Discharge Instructions (Addendum)
Eye Surgery Discharge Instructions  Expect mild scratchy sensation or mild soreness. DO NOT RUB YOUR EYE!  The day of surgery: . Minimal physical activity, but bed rest is not required . No reading, computer work, or close hand work . No bending, lifting, or straining. . May watch TV  For 24 hours: . No driving, legal decisions, or alcoholic beverages . Safety precautions . Eat anything you prefer: It is better to start with liquids, then soup then solid foods. . _____ Eye patch should be worn until postoperative exam tomorrow. . ____ Solar shield eyeglasses should be worn for comfort in the sunlight/patch while sleeping  Resume all regular medications including aspirin or Coumadin if these were discontinued prior to surgery. You may shower, bathe, shave, or wash your hair. Tylenol may be taken for mild discomfort. Follow Dr. Gerome Sam eye drop instruction sheet as reviewed.  Call your doctor if you experience significant pain, nausea, or vomiting, fever > 101 or other signs of infection. [phone] or [phone] Specific instructions:  Follow-up Information    Galen Manila, MD Follow up.   Specialty: Ophthalmology Why: 10/14/19 @ 10:25 am Contact information: [address] [place] [address] [phone]

## 2019-10-13 ENCOUNTER — Ambulatory Visit
Admission: RE | Admit: 2019-10-13 | Discharge: 2019-10-13 | Disposition: A | Discharge status: Home / Self Care | Payer: Federal, State, Local not specified - PPO | Attending: Ophthalmology | Admitting: Ophthalmology

## 2019-10-13 ENCOUNTER — Ambulatory Visit: Payer: Federal, State, Local not specified - PPO | Admitting: Anesthesiology

## 2019-10-13 ENCOUNTER — Other Ambulatory Visit: Payer: Self-pay

## 2019-10-13 ENCOUNTER — Encounter
Admission: RE | Disposition: A | Discharge status: Home / Self Care | Payer: Self-pay | Source: Home / Self Care | Attending: Ophthalmology

## 2019-10-13 ENCOUNTER — Encounter: Payer: Self-pay | Admitting: Ophthalmology

## 2019-10-13 DIAGNOSIS — K219 Gastro-esophageal reflux disease without esophagitis: Secondary | ICD-10-CM | POA: Insufficient documentation

## 2019-10-13 DIAGNOSIS — Z7982 Long term (current) use of aspirin: Secondary | ICD-10-CM | POA: Diagnosis not present

## 2019-10-13 DIAGNOSIS — I110 Hypertensive heart disease with heart failure: Secondary | ICD-10-CM | POA: Diagnosis not present

## 2019-10-13 DIAGNOSIS — Z950 Presence of cardiac pacemaker: Secondary | ICD-10-CM | POA: Diagnosis not present

## 2019-10-13 DIAGNOSIS — H2511 Age-related nuclear cataract, right eye: Secondary | ICD-10-CM | POA: Insufficient documentation

## 2019-10-13 DIAGNOSIS — Z79899 Other long term (current) drug therapy: Secondary | ICD-10-CM | POA: Diagnosis not present

## 2019-10-13 DIAGNOSIS — I509 Heart failure, unspecified: Secondary | ICD-10-CM | POA: Insufficient documentation

## 2019-10-13 DIAGNOSIS — F329 Major depressive disorder, single episode, unspecified: Secondary | ICD-10-CM | POA: Diagnosis not present

## 2019-10-13 DIAGNOSIS — G629 Polyneuropathy, unspecified: Secondary | ICD-10-CM | POA: Diagnosis not present

## 2019-10-13 DIAGNOSIS — E890 Postprocedural hypothyroidism: Secondary | ICD-10-CM | POA: Insufficient documentation

## 2019-10-13 HISTORY — PX: CATARACT EXTRACTION W/PHACO: SHX586

## 2019-10-13 SURGERY — PHACOEMULSIFICATION, CATARACT, WITH IOL INSERTION
Anesthesia: Monitor Anesthesia Care | Site: Eye | Laterality: Right

## 2019-10-13 MED ORDER — MOXIFLOXACIN HCL 0.5 % OP SOLN: 1.0000 [drp] | OPHTHALMIC | Status: DC | PRN

## 2019-10-13 MED ORDER — MIDAZOLAM HCL 2 MG/2ML IJ SOLN
INTRAMUSCULAR | Status: AC
  Filled 2019-10-13: qty 2

## 2019-10-13 MED ORDER — ARMC OPHTHALMIC DILATING DROPS
OPHTHALMIC | Status: AC
  Filled 2019-10-13: qty 0.5

## 2019-10-13 MED ORDER — SODIUM CHLORIDE 0.9 % IV SOLN: INTRAVENOUS | Status: DC

## 2019-10-13 MED ORDER — LIDOCAINE HCL (PF) 4 % IJ SOLN
INTRAOCULAR | Status: DC | PRN
  Administered 2019-10-13: 2 mL via OPHTHALMIC

## 2019-10-13 MED ORDER — TETRACAINE HCL 0.5 % OP SOLN
1.0000 [drp] | OPHTHALMIC | Status: AC | PRN
  Administered 2019-10-13 (×2): 1 [drp] via OPHTHALMIC

## 2019-10-13 MED ORDER — EPINEPHRINE PF 1 MG/ML IJ SOLN
INTRAOCULAR | Status: DC | PRN
  Administered 2019-10-13: 200 mL via OPHTHALMIC

## 2019-10-13 MED ORDER — POVIDONE-IODINE 5 % OP SOLN
OPHTHALMIC | Status: DC | PRN
  Administered 2019-10-13: 1 via OPHTHALMIC

## 2019-10-13 MED ORDER — MOXIFLOXACIN HCL 0.5 % OP SOLN
OPHTHALMIC | Status: AC
  Filled 2019-10-13: qty 3

## 2019-10-13 MED ORDER — MOXIFLOXACIN HCL 0.5 % OP SOLN
OPHTHALMIC | Status: DC | PRN
  Administered 2019-10-13: 0.2 mL via OPHTHALMIC

## 2019-10-13 MED ORDER — PROPOFOL 10 MG/ML IV BOLUS
INTRAVENOUS | Status: AC
  Filled 2019-10-13: qty 20

## 2019-10-13 MED ORDER — NA CHONDROIT SULF-NA HYALURON 40-17 MG/ML IO SOLN
INTRAOCULAR | Status: DC | PRN
  Administered 2019-10-13: 1 mL via INTRAOCULAR

## 2019-10-13 MED ORDER — CARBACHOL 0.01 % IO SOLN
INTRAOCULAR | Status: DC | PRN
  Administered 2019-10-13: 0.5 mL via INTRAOCULAR

## 2019-10-13 MED ORDER — MIDAZOLAM HCL 2 MG/2ML IJ SOLN
INTRAMUSCULAR | Status: DC | PRN
  Administered 2019-10-13 (×2): .5 mg via INTRAVENOUS

## 2019-10-13 MED ORDER — TETRACAINE HCL 0.5 % OP SOLN
OPHTHALMIC | Status: AC
  Filled 2019-10-13: qty 4

## 2019-10-13 MED ORDER — ARMC OPHTHALMIC DILATING DROPS
1.0000 "application " | OPHTHALMIC | Status: AC | PRN
  Administered 2019-10-13 (×3): 1 via OPHTHALMIC

## 2019-10-13 MED ORDER — ONDANSETRON HCL 4 MG/2ML IJ SOLN
INTRAMUSCULAR | Status: DC | PRN
  Administered 2019-10-13: 4 mg via INTRAVENOUS

## 2019-10-13 SURGICAL SUPPLY — 16 items
GLOVE BIO SURGEON STRL SZ8 (GLOVE) ×2 IMPLANT
GLOVE BIOGEL M 6.5 STRL (GLOVE) ×2 IMPLANT
GLOVE SURG LX 8.0 MICRO (GLOVE) ×1
GLOVE SURG LX STRL 8.0 MICRO (GLOVE) ×1 IMPLANT
GOWN STRL REUS W/ TWL LRG LVL3 (GOWN DISPOSABLE) ×2 IMPLANT
GOWN STRL REUS W/TWL LRG LVL3 (GOWN DISPOSABLE) ×2
LABEL CATARACT MEDS ST (LABEL) ×2 IMPLANT
LENS IOL DIOP 19.0 (Intraocular Lens) ×2 IMPLANT
LENS IOL TECNIS MONO 19.0 (Intraocular Lens) IMPLANT
PACK CATARACT BRASINGTON LX (MISCELLANEOUS) ×2 IMPLANT
PACK EYE AFTER SURG (MISCELLANEOUS) ×2 IMPLANT
SOL BSS BAG (MISCELLANEOUS) ×2
SOLUTION BSS BAG (MISCELLANEOUS) ×1 IMPLANT
SYR 5ML LL (SYRINGE) ×2 IMPLANT
WATER STERILE IRR 250ML POUR (IV SOLUTION) ×2 IMPLANT
WIPE NON LINTING 3.25X3.25 (MISCELLANEOUS) ×2 IMPLANT

## 2019-10-13 NOTE — Transfer of Care (Signed)
Immediate Anesthesia Transfer of Care Note  Patient: Vanessa Lang  Procedure(s) Performed: CATARACT EXTRACTION PHACO AND INTRAOCULAR LENS PLACEMENT (I[facility]) RIGHT (Right Eye)  Patient Location: PACU  Anesthesia Type:MAC  Level of Consciousness: awake, alert , oriented and patient cooperative  Airway & Oxygen Therapy: Patient Spontanous Breathing and Patient connected to face mask oxygen  Post-op Assessment: Report given to RN and Post -op Vital signs reviewed and stable  Post vital signs: Reviewed and stable  Last Vitals:  Vitals Value Taken Time  BP    Temp    Pulse    Resp    SpO2      Last Pain:  Vitals:   10/13/19 0935  TempSrc: Temporal  PainSc: 0-No pain         Complications: No complications documented.

## 2019-10-13 NOTE — Anesthesia Postprocedure Evaluation (Signed)
Anesthesia Post Note  Patient: Vanessa Lang  Procedure(s) Performed: CATARACT EXTRACTION PHACO AND INTRAOCULAR LENS PLACEMENT ([place]) RIGHT (Right Eye)  Patient location during evaluation: Phase II Anesthesia Type: MAC Level of consciousness: awake and alert and patient cooperative Pain management: pain level controlled Vital Signs Assessment: post-procedure vital signs reviewed and stable Respiratory status: spontaneous breathing and respiratory function stable Cardiovascular status: stable Postop Assessment: no apparent nausea or vomiting Anesthetic complications: no   No complications documented.   Last Vitals:  Vitals:   10/13/19 0935 10/13/19 1053  BP: (!) 143/95 (!) 150/91  Pulse: 65 62  Resp: 14 16  Temp: 36.8 C 36.6 C  SpO2: 98% 99%    Last Pain:  Vitals:   10/13/19 1053  TempSrc: Temporal  PainSc: 0-No pain                 Elora Wolter Fletcher-Harrison

## 2019-10-13 NOTE — H&P (Signed)
All labs reviewed. Abnormal studies sent to patients PCP when indicated.  Previous H&P reviewed, patient examined, there are NO CHANGES.  Vanessa Mcnew Porfilio6/10/202110:25 AM

## 2019-10-13 NOTE — Op Note (Signed)
PREOPERATIVE DIAGNOSIS:  Nuclear sclerotic cataract of the right eye.   POSTOPERATIVE DIAGNOSIS:  H25.11 Cataract   OPERATIVE PROCEDURE: Procedure(s): CATARACT EXTRACTION PHACO AND INTRAOCULAR LENS PLACEMENT (IOC) RIGHT   SURGEON:  Galen Manila, MD.   ANESTHESIA:  Anesthesiologist: Piscitello, Cleda Mccreedy, MD CRNA: Mohammed Kindle, CRNA  1.      Managed anesthesia care. 2.      0.35ml of Shugarcaine was instilled in the eye following the paracentesis.   COMPLICATIONS:  None.   TECHNIQUE:   Stop and chop   DESCRIPTION OF PROCEDURE:  The patient was examined and consented in the preoperative holding area where the aforementioned topical anesthesia was applied to the right eye and then brought back to the Operating Room where the right eye was prepped and draped in the usual sterile ophthalmic fashion and a lid speculum was placed. A paracentesis was created with the side port blade and the anterior chamber was filled with viscoelastic. A near clear corneal incision was performed with the steel keratome. A continuous curvilinear capsulorrhexis was performed with a cystotome followed by the capsulorrhexis forceps. Hydrodissection and hydrodelineation were carried out with BSS on a blunt cannula. The lens was removed in a stop and chop  technique and the remaining cortical material was removed with the irrigation-aspiration handpiece. The capsular bag was inflated with viscoelastic and the Technis [id number]  lens was placed in the capsular bag without complication. The remaining viscoelastic was removed from the eye with the irrigation-aspiration handpiece. The wounds were hydrated. The anterior chamber was flushed with Miostat and the eye was inflated to physiologic pressure. 0.62ml of Vigamox was placed in the anterior chamber. The wounds were found to be water tight. The eye was dressed with Vigamox. The patient was given protective glasses to wear throughout the day and a shield with which  to sleep tonight. The patient was also given drops with which to begin a drop regimen today and will follow-up with me in one day. Implant Name Type Inv. Item Serial No. Manufacturer Lot No. LRB No. Used Action  LENS IOL DIOP 19.0 - [id number] Intraocular Lens LENS IOL DIOP 19.0 [id number]  Right 1 Implanted   Procedure(s) with comments: CATARACT EXTRACTION PHACO AND INTRAOCULAR LENS PLACEMENT (IOC) RIGHT (Right) - US00:35.5 EYC1.44 [id number]  Electronically signed: Galen Manila 10/13/2019 10:51 AM

## 2019-10-13 NOTE — Anesthesia Preprocedure Evaluation (Signed)
Anesthesia Evaluation  Patient identified by MRN, date of birth, ID band Patient awake    Reviewed: Allergy & Precautions, H&P , NPO status , Patient's Chart, lab work & pertinent test results, reviewed documented beta blocker date and time   History of Anesthesia Complications Negative for: history of anesthetic complications  Airway Mallampati: II  TM Distance: >3 FB Neck ROM: limited    Dental  (+) Partial Upper, Lower Dentures   Pulmonary shortness of breath, former smoker,    Pulmonary exam normal        Cardiovascular hypertension, + angina +CHF  Normal cardiovascular exam+ pacemaker + Valvular Problems/Murmurs      Neuro/Psych PSYCHIATRIC DISORDERS Depression negative neurological ROS     GI/Hepatic Neg liver ROS, GERD  Controlled,  Endo/Other  negative endocrine ROSHypothyroidism   Renal/GU Renal disease     Musculoskeletal  (+) Arthritis ,   Abdominal   Peds  Hematology negative hematology ROS (+)   Anesthesia Other Findings Past Medical History: 09/06/2019: Anginal pain (HCC)     Comment:  last episode 2 months ago No date: Arthritis No date: Bowel obstruction (HCC) No date: CHF (congestive heart failure) (HCC) No date: Depression No date: Diverticulitis No date: Dyspnea No date: GERD (gastroesophageal reflux disease) No date: Gout No date: Gout No date: Heart murmur No date: Heart valve disorder No date: HLD (hyperlipidemia) No date: HTN (hypertension) No date: Hypothyroidism No date: Neuropathy 1/26/201: Presence of permanent cardiac pacemaker     Comment:  [employer] [id number] No date: Wears dentures     Comment:  partial upper, full lower  Past Surgical History: No date: ABDOMINAL HYSTERECTOMY No date: ABDOMINAL SURGERY No date: COLON SURGERY     Comment:  colon resection (for blockage)  with hernia repair No date: HERNIA REPAIR 06/28/2018: IR ANGIOGRAM VISCERAL SELECTIVE No date:  PACEMAKER INSERTION No date: THYROIDECTOMY  BMI    Body Mass Index: 19.23 kg/m      Reproductive/Obstetrics negative OB ROS                             Anesthesia Physical  Anesthesia Plan  ASA: III  Anesthesia Plan: MAC   Post-op Pain Management:    Induction: Intravenous  PONV Risk Score and Plan: 3 and Treatment may vary due to age or medical condition and TIVA  Airway Management Planned: Nasal Cannula and Natural Airway  Additional Equipment:   Intra-op Plan:   Post-operative Plan:   Informed Consent: I have reviewed the patients History and Physical, chart, labs and discussed the procedure including the risks, benefits and alternatives for the proposed anesthesia with the patient or authorized representative who has indicated his/her understanding and acceptance.     Dental Advisory Given  Plan Discussed with: CRNA  Anesthesia Plan Comments: (Patient consented for risks of anesthesia including but not limited to:  - adverse reactions to medications - damage to eyes, teeth, lips or other oral mucosa - nerve damage due to positioning  - sore throat or hoarseness - Damage to heart, brain, nerves, lungs, other parts of body or loss of life  Patient voiced understanding.)        Anesthesia Quick Evaluation

## 2019-10-14 ENCOUNTER — Encounter: Payer: Self-pay | Admitting: Ophthalmology

## 2020-02-11 IMAGING — CT CT ABD-PELV W/O CM
2 of 4 series · 15 of 46 positions shown, 17 images · non-contrast
Comparison: 06/28/2018

CLINICAL DATA: Status post recent angiogram with groin hematoma and
drop in hemoglobin.

EXAM:
CT ABDOMEN AND PELVIS WITHOUT CONTRAST
TECHNIQUE: Multidetector CT imaging of the abdomen and pelvis was performed
following the standard protocol without IV contrast.

[Series 2: routine abd/pel wo · axial · 0.73mm/px · z∈[-466,-71]mm · 12 of 87 slices shown, 14 images]
[im 4/87  soft-tissue]
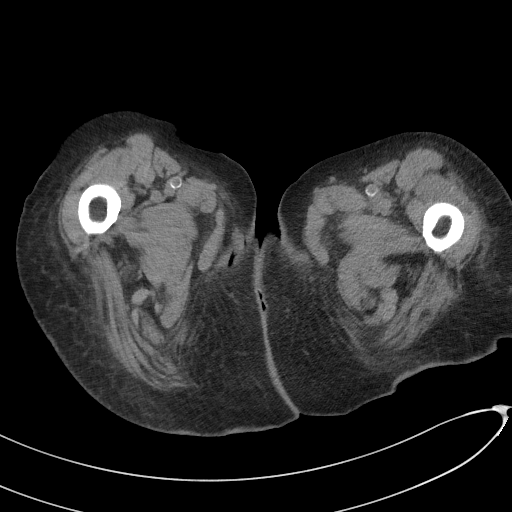
[im 4/87  bone]
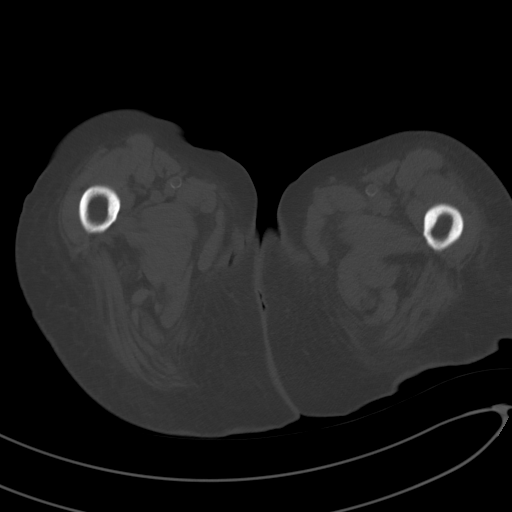
[im 12/87  soft-tissue]
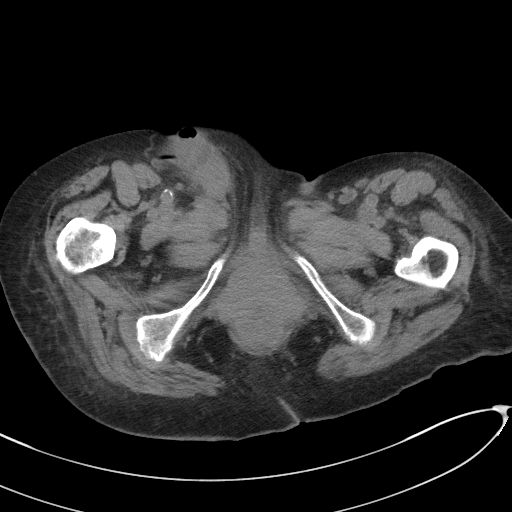
[im 19/87  soft-tissue]
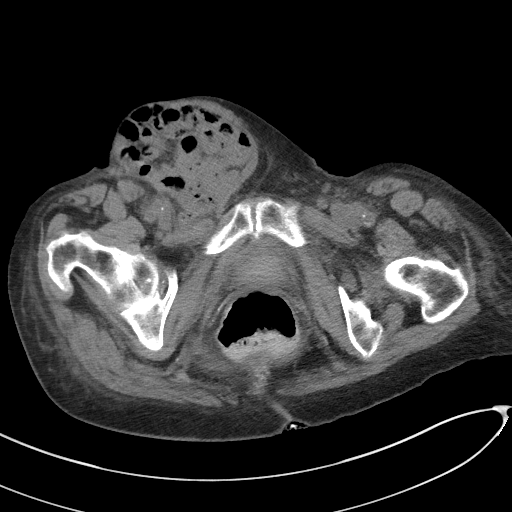
[im 27/87  soft-tissue]
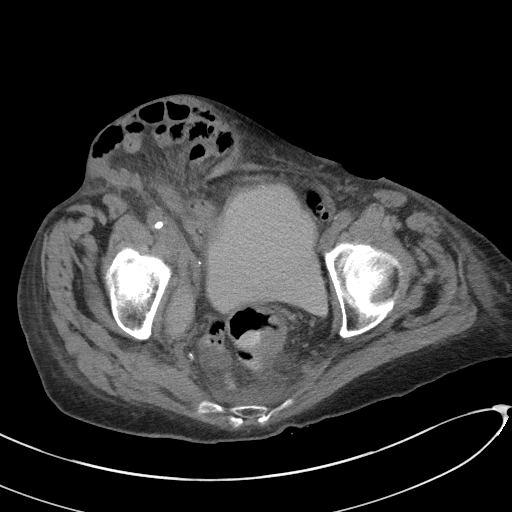
[im 34/87  soft-tissue]
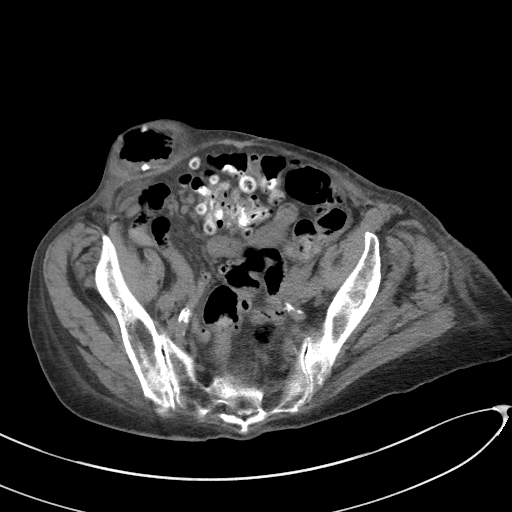
[im 42/87  soft-tissue]
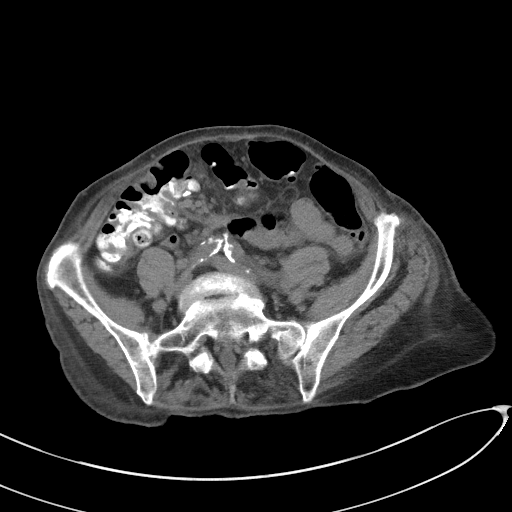
[im 45/87  soft-tissue]
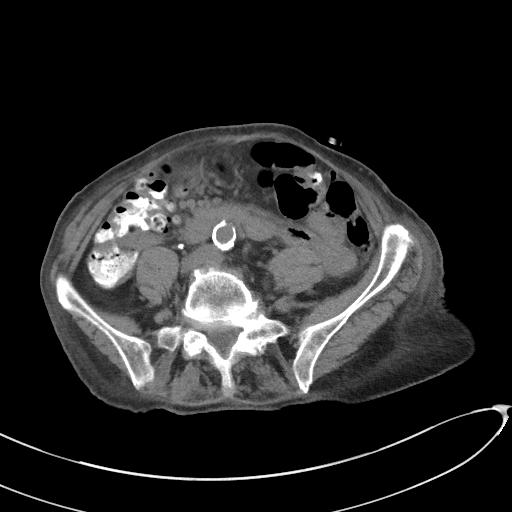
[im 53/87  soft-tissue]
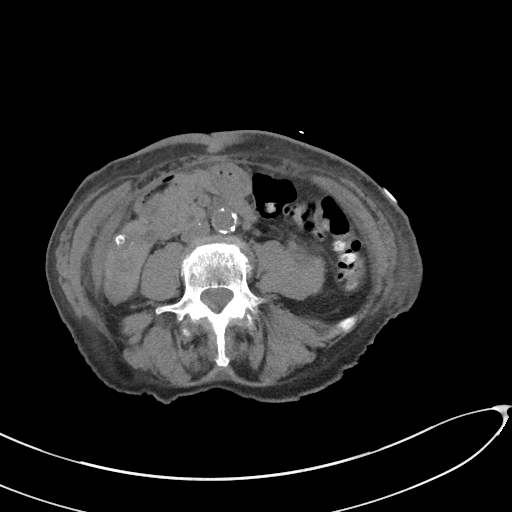
[im 60/87  soft-tissue]
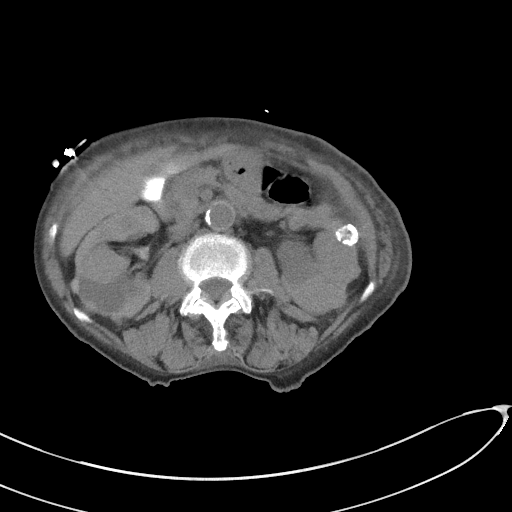
[im 60/87  bone]
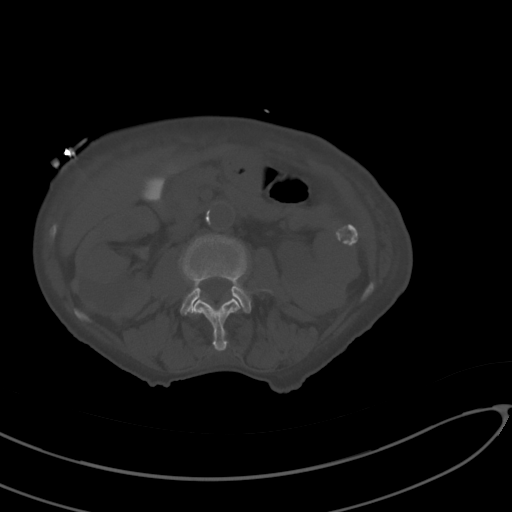
[im 68/87  soft-tissue]
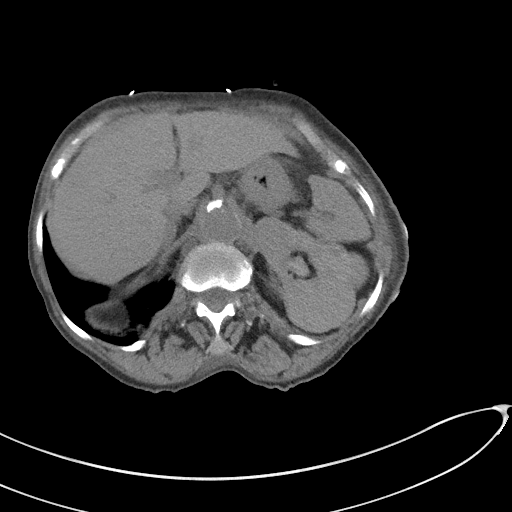
[im 75/87  soft-tissue]
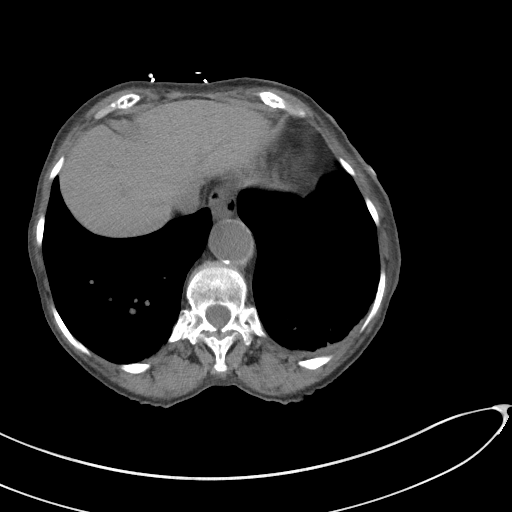
[im 83/87  soft-tissue]
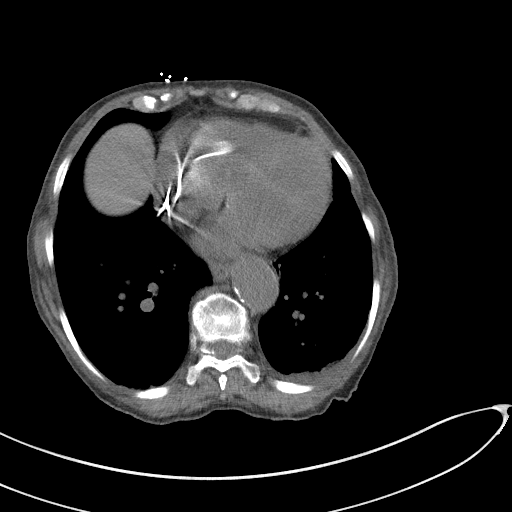

[Series 5: coronal st · coronal · 0.80mm/px · 3 of 92 slices shown]
[im 31/92  soft-tissue]
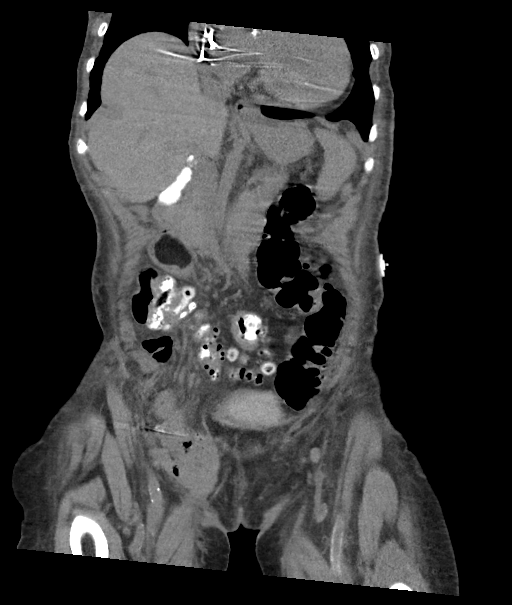
[im 41/92  soft-tissue]
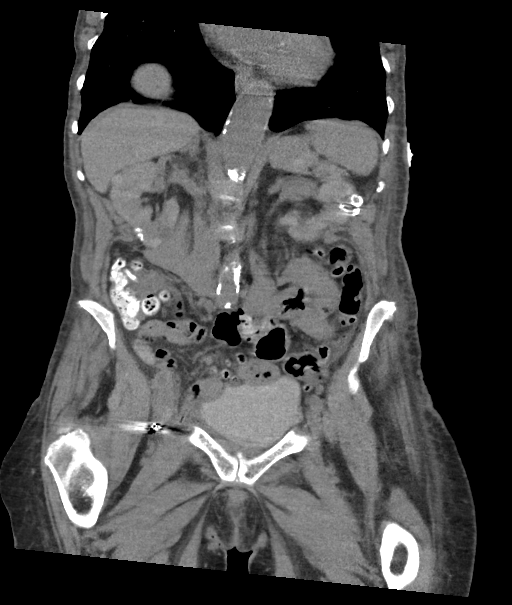
[im 51/92  soft-tissue]
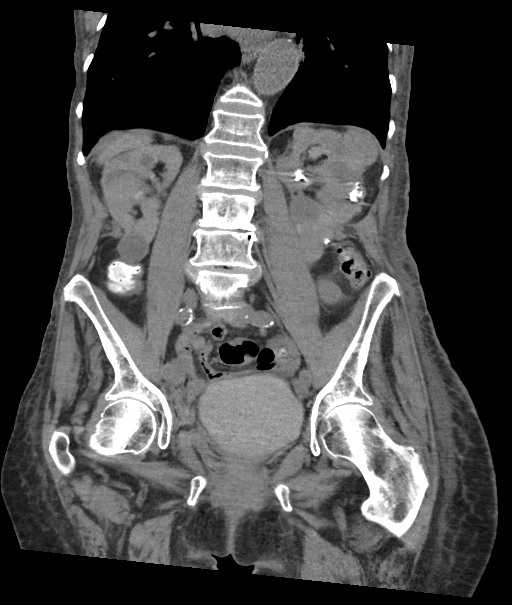

[15 of 46 positions shown; findings below may reference images not displayed]

FINDINGS: Lower chest: Small left pleural effusion. Minimal overlying
atelectasis. No worrisome pulmonary lesions. Stable mild cardiac
enlargement and vascular calcifications.

Hepatobiliary: No focal hepatic lesion or intrahepatic biliary
dilatation. The gallbladder contains contrast likely due to
vicarious excretion.

Pancreas: No mass, inflammation or ductal dilatation.

Spleen: Normal size.  No focal lesions.

Adrenals/Urinary Tract: Stable nodularity of both adrenal glands. No
discrete mass. Possible nodular hyperplasia or small adenomas.

Again demonstrated are numerous bilateral simple and complex renal
cysts. Several of these demonstrate dense calcifications and some
cysts are hyperdense/hemorrhagic. I do not see any worrisome lesions
without contrast and I do not see any significant change since prior
CT scan from 5487. No hydronephrosis.

The bladder contains contrast and appears normal. No asymmetric
bladder wall thickening or mass.

Stomach/Bowel: Stable large right inguinal hernia containing
numerous small bowel loops and evidence of surgical anastomosis. The
colon demonstrates extensive diverticulosis but no definite findings
for acute diverticulitis.

Vascular/Lymphatic: Extensive atherosclerotic calcifications
involving the aorta and iliac arteries. Stable branch vessel
calcifications also.

No mesenteric or retroperitoneal mass or adenopathy.

Reproductive: Surgically absent.

Other: There is a small right groin hematoma contiguous with a
small/moderate sized right-sided extraperitoneal pelvic hematoma. No
mass effect on the bladder appeared presacral edema/fluid is also
noted.

Musculoskeletal: Stable degenerative changes involving the spine and
hips. No acute bony findings or destructive bony lesions.
IMPRESSION: 1. Small right groin hematoma contiguous with a small to moderate
extraperitoneal right-sided pelvic hematoma.
2. Stable large right inguinal hernia containing numerous small
bowel loops but no findings for obstruction or incarceration.
3. Numerous complex renal cysts bilaterally but no obvious worrisome
lesion.
4. Diffuse colonic diverticulosis but no findings for acute
diverticulitis.
5. Advanced atherosclerotic calcifications involving the aorta,
iliac arteries and branch vessels.
6. Small left pleural effusion.

## 2020-07-02 ENCOUNTER — Other Ambulatory Visit: Payer: Self-pay

## 2020-07-02 ENCOUNTER — Other Ambulatory Visit
Admission: RE | Admit: 2020-07-02 | Discharge: 2020-07-02 | Disposition: A | Discharge status: Home / Self Care | Payer: Federal, State, Local not specified - PPO | Source: Ambulatory Visit | Attending: Cardiology | Admitting: Cardiology

## 2020-07-02 DIAGNOSIS — Z45010 Encounter for checking and testing of cardiac pacemaker pulse generator [battery]: Secondary | ICD-10-CM | POA: Diagnosis present

## 2020-07-02 DIAGNOSIS — Z01812 Encounter for preprocedural laboratory examination: Secondary | ICD-10-CM | POA: Insufficient documentation

## 2020-07-02 DIAGNOSIS — Z20822 Contact with and (suspected) exposure to covid-19: Secondary | ICD-10-CM | POA: Insufficient documentation

## 2020-07-02 LAB — SARS CORONAVIRUS 2 (TAT 6-24 HRS): SARS Coronavirus 2: NEGATIVE

## 2020-07-04 ENCOUNTER — Encounter: Payer: Self-pay | Admitting: Cardiology

## 2020-07-04 ENCOUNTER — Encounter
Admission: RE | Disposition: A | Discharge status: Home / Self Care | Payer: Self-pay | Source: Home / Self Care | Attending: Cardiology

## 2020-07-04 ENCOUNTER — Ambulatory Visit
Admission: RE | Admit: 2020-07-04 | Discharge: 2020-07-04 | Disposition: A | Discharge status: Home / Self Care | Payer: Federal, State, Local not specified - PPO | Attending: Cardiology | Admitting: Cardiology

## 2020-07-04 ENCOUNTER — Other Ambulatory Visit: Payer: Self-pay

## 2020-07-04 DIAGNOSIS — Z45010 Encounter for checking and testing of cardiac pacemaker pulse generator [battery]: Secondary | ICD-10-CM | POA: Insufficient documentation

## 2020-07-04 DIAGNOSIS — Z45018 Encounter for adjustment and management of other part of cardiac pacemaker: Secondary | ICD-10-CM

## 2020-07-04 DIAGNOSIS — Z20822 Contact with and (suspected) exposure to covid-19: Secondary | ICD-10-CM | POA: Insufficient documentation

## 2020-07-04 HISTORY — PX: PPM GENERATOR CHANGEOUT: EP1233

## 2020-07-04 SURGERY — PPM GENERATOR CHANGEOUT
Anesthesia: Moderate Sedation

## 2020-07-04 MED ORDER — FENTANYL CITRATE (PF) 100 MCG/2ML IJ SOLN
INTRAMUSCULAR | Status: AC
  Filled 2020-07-04: qty 2

## 2020-07-04 MED ORDER — SODIUM CHLORIDE 0.45 % IV SOLN: INTRAVENOUS | Status: DC

## 2020-07-04 MED ORDER — LIDOCAINE HCL (PF) 1 % IJ SOLN
INTRAMUSCULAR | Status: DC | PRN
  Administered 2020-07-04: 20 mL

## 2020-07-04 MED ORDER — SODIUM CHLORIDE 0.9 % IV SOLN
80.0000 mg | INTRAVENOUS | Status: DC
  Filled 2020-07-04: qty 2

## 2020-07-04 MED ORDER — ACETAMINOPHEN 325 MG PO TABS: 325.0000 mg | ORAL_TABLET | ORAL | Status: DC | PRN

## 2020-07-04 MED ORDER — ONDANSETRON HCL 4 MG/2ML IJ SOLN: 4.0000 mg | Freq: Four times a day (QID) | INTRAMUSCULAR | Status: DC | PRN

## 2020-07-04 MED ORDER — SODIUM CHLORIDE 0.9 % IV SOLN
INTRAVENOUS | Status: DC | PRN
  Administered 2020-07-04: 500 mL

## 2020-07-04 MED ORDER — MIDAZOLAM HCL 2 MG/2ML IJ SOLN
INTRAMUSCULAR | Status: DC | PRN
  Administered 2020-07-04: 0.5 mg via INTRAVENOUS

## 2020-07-04 MED ORDER — CEPHALEXIN 250 MG PO CAPS: 250.0000 mg | ORAL_CAPSULE | Freq: Two times a day (BID) | ORAL | 0 refills | Status: AC

## 2020-07-04 MED ORDER — MIDAZOLAM HCL 2 MG/2ML IJ SOLN
INTRAMUSCULAR | Status: AC
  Filled 2020-07-04: qty 2

## 2020-07-04 MED ORDER — FENTANYL CITRATE (PF) 100 MCG/2ML IJ SOLN
INTRAMUSCULAR | Status: DC | PRN
  Administered 2020-07-04: 12.5 ug via INTRAVENOUS

## 2020-07-04 MED ORDER — CHLORHEXIDINE GLUCONATE CLOTH 2 % EX PADS: 6.0000 | MEDICATED_PAD | Freq: Every day | CUTANEOUS | Status: DC

## 2020-07-04 MED ORDER — CEFAZOLIN SODIUM-DEXTROSE 2-4 GM/100ML-% IV SOLN
2.0000 g | INTRAVENOUS | Status: AC
  Administered 2020-07-04: 2 g via INTRAVENOUS

## 2020-07-04 MED ORDER — SODIUM CHLORIDE 0.9 % IV SOLN: INTRAVENOUS | Status: DC

## 2020-07-04 MED ORDER — LIDOCAINE HCL (PF) 1 % IJ SOLN
INTRAMUSCULAR | Status: AC
  Filled 2020-07-04: qty 30

## 2020-07-04 SURGICAL SUPPLY — 11 items
CABLE SURG 12 DISP A/V CHANNEL (MISCELLANEOUS) IMPLANT
DEVICE DSSCT PLSMBLD 3.0S LGHT (MISCELLANEOUS) ×1 IMPLANT
DRAPE INCISE 23X17 IOBAN STRL (DRAPES) ×1
DRAPE INCISE IOBAN 23X17 STRL (DRAPES) ×1 IMPLANT
IPG PACE AZUR XT DR MRI W1DR01 (Pacemaker) ×1 IMPLANT
PACE AZURE XT DR MRI W1DR01 (Pacemaker) ×2 IMPLANT
PAD ELECT DEFIB RADIOL ZOLL (MISCELLANEOUS) ×2 IMPLANT
PLASMABLADE 3.0S W/LIGHT (MISCELLANEOUS) ×2
SUT VIC AB 2-0 CT1 (SUTURE) ×2 IMPLANT
SUT VICRYL+ 3-0 36IN CT-1 (SUTURE) ×2 IMPLANT
TRAY PACEMAKER INSERTION (PACKS) ×2 IMPLANT

## 2020-07-04 NOTE — Discharge Instructions (Signed)
Pacemaker Battery Change, Care After This sheet gives you information about how to care for yourself after your procedure. Your health care provider may also give you more specific instructions. If you have problems or questions, contact your health care provider. What can I expect after the procedure? After the procedure, it is common to have these symptoms at the site where the pacemaker was inserted:  Mild pain or soreness.  Slight bruising.  Some swelling over the incisions.  A slight bump over the skin where the device was placed (if it was implanted in the upper chest area). Sometimes, it is possible to feel the device under the skin. This is normal. Follow these instructions at home: Incision care  Keep the incision clean and dry for 2-3 days after the procedure or as told by your health care provider. It takes several weeks for the incision site to completely heal.  Do not remove the bandage (dressing) on your chest until told to do so by your health care provider.  Leave stitches (sutures), skin glue, or adhesive strips in place. These skin closures may need to stay in place for 2 weeks or longer. If adhesive strip edges start to loosen and curl up, you may trim the loose edges. Do not remove adhesive strips completely unless your health care provider tells you to do that.  Do not take baths, swim, or use a hot tub for 7-10 days or until your health care provider approves. Ask your health care provider if you may take showers. You may only be allowed to take sponge baths.  Pat the incision area dry with a clean towel. Do not rub the area. This may cause bleeding.  Check your incision area every day for signs of infection. Check for: ? More redness, swelling, or pain. ? Fluid or blood. ? Warmth. ? Pus or a bad smell.  Avoid putting pressure on the area where the pacemaker was placed. Women may want to place a small pad over the incision site to protect it from their bra strap.    Medicines  Take over-the-counter and prescription medicines only as told by your health care provider.  If you were prescribed an antibiotic medicine, take it as told by your health care provider. Do not stop taking the antibiotic even if you start to feel better. Activity  For the first 2 weeks, or as long as told by your health care provider: ? Avoid lifting your left arm higher than your shoulder. ? Be gentle when you move your arms over your head. It is okay to raise your arm to comb your hair. ? Avoid exercise or activities that take a lot of effort.  Ask your health care provider when it is okay to: ? Return to your normal activities. ? Return to work or school. ? Resume sexual activity.  If you were given a medicine to help you relax (sedative) during the procedure, it can affect you for several hours. Do not drive or operate machinery until your health care provider says that it is safe. General instructions  Do not use any products that contain nicotine or tobacco, such as cigarettes, e-cigarettes, and chewing tobacco. These can delay incision healing after surgery. If you need help quitting, ask your health care provider.  Always let all health care providers, including dentists, know about your pacemaker before you have any medical procedures or tests.  You may be shown how to transfer data from your pacemaker through the phone to your   health care provider.  Wear a medical ID bracelet or necklace stating that you have a pacemaker, and carry a pacemaker ID card with you at all times.  Avoid close and prolonged exposure to electrical devices that have strong magnetic fields. These include: ? Airport security checkpoints. When at the airport, let officials know that you have a pacemaker. Carry your pacemaker ID card. ? Metal detectors. If you must pass through a metal detector, walk through it quickly. Do not stop under the detector or stand near it.  When using your mobile  phone, hold it to the ear opposite the pacemaker. Do not leave your mobile phone in a pocket over the pacemaker.  Your pacemaker battery will last for 5-15 years. Your health care provider will do routine checks to know when the battery is starting to run down. When this happens, the pacemaker will need to be replaced.  Keep all follow-up visits as told by your health care provider. This is important. Contact a health care provider if:  You have pain at the incision site that is not relieved by medicines.  You have any of these signs of infection: ? More redness, swelling, or pain around your incision. ? Fluid or blood coming from your incision. ? Warmth coming from your incision. ? Pus or a bad smell coming from your incision. ? A fever.  You feel brief, occasional palpitations, light-headedness, or any symptoms that you think might be related to your heart. Get help right away if:  You have chest pain that is different from the pain at the pacemaker site.  You develop a red streak that extends above or below the incision site.  You have shortness of breath.  You have palpitations or an irregular heartbeat.  You have light-headedness that does not go away quickly.  You faint or have dizzy spells.  Your pulse suddenly drops or increases rapidly and does not return to normal.  You gain weight and your legs and ankles swell. Summary  After the procedure, it is common to have pain, soreness, and some swelling or bruising where the pacemaker was inserted.  Keep your incision clean and dry. Follow instructions from your health care provider about how to take care of your incision.  Check your incision every day for signs of infection, such as more pain or swelling, pus or a bad smell, warmth, or leaking fluid or blood.  Carry a pacemaker ID card with you at all times. This information is not intended to replace advice given to you by your health care provider. Make sure you  discuss any questions you have with your health care provider. Document Revised: 03/24/2019 Document Reviewed: 03/24/2019 [employer] Patient Education  2021 [employer]  

## 2021-01-17 ENCOUNTER — Other Ambulatory Visit: Payer: Self-pay

## 2021-01-17 ENCOUNTER — Emergency Department
Admission: EM | Admit: 2021-01-17 | Discharge: 2021-01-17 | Disposition: A | Discharge status: Home / Self Care | Payer: Federal, State, Local not specified - PPO | Attending: Emergency Medicine | Admitting: Emergency Medicine

## 2021-01-17 ENCOUNTER — Emergency Department: Payer: Federal, State, Local not specified - PPO

## 2021-01-17 ENCOUNTER — Telehealth: Payer: Self-pay

## 2021-01-17 DIAGNOSIS — K4030 Unilateral inguinal hernia, with obstruction, without gangrene, not specified as recurrent: Secondary | ICD-10-CM | POA: Insufficient documentation

## 2021-01-17 DIAGNOSIS — Z79899 Other long term (current) drug therapy: Secondary | ICD-10-CM | POA: Diagnosis not present

## 2021-01-17 DIAGNOSIS — K4031 Unilateral inguinal hernia, with obstruction, without gangrene, recurrent: Secondary | ICD-10-CM | POA: Diagnosis not present

## 2021-01-17 DIAGNOSIS — K56609 Unspecified intestinal obstruction, unspecified as to partial versus complete obstruction: Secondary | ICD-10-CM | POA: Diagnosis not present

## 2021-01-17 DIAGNOSIS — R1031 Right lower quadrant pain: Secondary | ICD-10-CM

## 2021-01-17 DIAGNOSIS — Z7982 Long term (current) use of aspirin: Secondary | ICD-10-CM | POA: Insufficient documentation

## 2021-01-17 DIAGNOSIS — Z87891 Personal history of nicotine dependence: Secondary | ICD-10-CM | POA: Insufficient documentation

## 2021-01-17 DIAGNOSIS — E039 Hypothyroidism, unspecified: Secondary | ICD-10-CM | POA: Insufficient documentation

## 2021-01-17 DIAGNOSIS — I110 Hypertensive heart disease with heart failure: Secondary | ICD-10-CM | POA: Diagnosis not present

## 2021-01-17 DIAGNOSIS — R110 Nausea: Secondary | ICD-10-CM

## 2021-01-17 DIAGNOSIS — I5032 Chronic diastolic (congestive) heart failure: Secondary | ICD-10-CM | POA: Diagnosis not present

## 2021-01-17 LAB — CBC
HCT: 38.4 % (ref 36.0–46.0)
Hemoglobin: 12.9 g/dL (ref 12.0–15.0)
MCH: 32.3 pg (ref 26.0–34.0)
MCHC: 33.6 g/dL (ref 30.0–36.0)
MCV: 96.2 fL (ref 80.0–100.0)
Platelets: 192 10*3/uL (ref 150–400)
RBC: 3.99 MIL/uL (ref 3.87–5.11)
RDW: 12.4 % (ref 11.5–15.5)
WBC: 13.6 10*3/uL — ABNORMAL HIGH (ref 4.0–10.5)
nRBC: 0 % (ref 0.0–0.2)

## 2021-01-17 LAB — URINALYSIS, COMPLETE (UACMP) WITH MICROSCOPIC
Bacteria, UA: NONE SEEN
Bilirubin Urine: NEGATIVE
Glucose, UA: NEGATIVE mg/dL
Hgb urine dipstick: NEGATIVE
Ketones, ur: NEGATIVE mg/dL
Leukocytes,Ua: NEGATIVE
Nitrite: NEGATIVE
Protein, ur: 100 mg/dL — AB
Specific Gravity, Urine: 1.018 (ref 1.005–1.030)
Squamous Epithelial / HPF: NONE SEEN (ref 0–5)
pH: 7 (ref 5.0–8.0)

## 2021-01-17 LAB — COMPREHENSIVE METABOLIC PANEL
ALT: 14 U/L (ref 0–44)
AST: 25 U/L (ref 15–41)
Albumin: 4.7 g/dL (ref 3.5–5.0)
Alkaline Phosphatase: 68 U/L (ref 38–126)
Anion gap: 10 (ref 5–15)
BUN: 28 mg/dL — ABNORMAL HIGH (ref 8–23)
CO2: 27 mmol/L (ref 22–32)
Calcium: 9.8 mg/dL (ref 8.9–10.3)
Chloride: 99 mmol/L (ref 98–111)
Creatinine, Ser: 1.24 mg/dL — ABNORMAL HIGH (ref 0.44–1.00)
GFR, Estimated: 40 mL/min — ABNORMAL LOW (ref >60)
Glucose, Bld: 142 mg/dL — ABNORMAL HIGH (ref 70–99)
Potassium: 3.8 mmol/L (ref 3.5–5.1)
Sodium: 136 mmol/L (ref 135–145)
Total Bilirubin: 0.7 mg/dL (ref 0.3–1.2)
Total Protein: 8.1 g/dL (ref 6.5–8.1)

## 2021-01-17 LAB — TROPONIN I (HIGH SENSITIVITY)
Troponin I (High Sensitivity): 30 ng/L — ABNORMAL HIGH (ref <18)
Troponin I (High Sensitivity): 35 ng/L — ABNORMAL HIGH (ref <18)

## 2021-01-17 LAB — LIPASE, BLOOD: Lipase: 29 U/L (ref 11–51)

## 2021-01-17 LAB — LACTIC ACID, PLASMA: Lactic Acid, Venous: 2.2 mmol/L (ref 0.5–1.9)

## 2021-01-17 MED ORDER — PIPERACILLIN-TAZOBACTAM 3.375 G IVPB 30 MIN
3.3750 g | Freq: Once | INTRAVENOUS | Status: AC
  Administered 2021-01-17: 3.375 g via INTRAVENOUS
  Filled 2021-01-17: qty 50

## 2021-01-17 MED ORDER — IOHEXOL 350 MG/ML SOLN
60.0000 mL | Freq: Once | INTRAVENOUS | Status: AC | PRN
  Administered 2021-01-17: 60 mL via INTRAVENOUS
  Filled 2021-01-17: qty 60

## 2021-01-17 MED ORDER — SODIUM CHLORIDE 0.9 % IV BOLUS
500.0000 mL | Freq: Once | INTRAVENOUS | Status: AC
  Administered 2021-01-17: 500 mL via INTRAVENOUS

## 2021-01-17 MED ORDER — FENTANYL CITRATE (PF) 250 MCG/5ML IJ SOLN
100.0000 ug | Freq: Once | INTRAMUSCULAR | Status: AC
  Administered 2021-01-17: 100 ug via INTRAVENOUS
  Filled 2021-01-17: qty 5

## 2021-01-17 MED ORDER — ONDANSETRON HCL 4 MG/2ML IJ SOLN
4.0000 mg | Freq: Once | INTRAMUSCULAR | Status: AC
  Administered 2021-01-17: 4 mg via INTRAVENOUS
  Filled 2021-01-17: qty 2

## 2021-01-17 NOTE — ED Triage Notes (Signed)
Pt to ED for generalized abd pain and epigastric pain that started at 2100 last night. Denies n/v/d. Hx hernias.  NAD noted

## 2021-01-17 NOTE — Telephone Encounter (Signed)
Faxed medical clearance to Dr. Dorothey Baseman at [phone].

## 2021-01-17 NOTE — ED Provider Notes (Signed)
[facility] Emergency Department Provider Note   ____________________________________________   Event Date/Time   First MD Initiated Contact with Patient 01/17/21 1036     (approximate)  I have reviewed the triage vital signs and the nursing notes.   HISTORY  Chief Complaint Abdominal Pain    HPI Vanessa Lang is a 87 y.o. female who presents for right lower quadrant abdominal pain  LOCATION: Right lower quadrant DURATION: Symptoms began last night TIMING: Worsening since onset SEVERITY: 10/10 QUALITY: Aching/sharp pain CONTEXT: Patient states she has a history of right inguinal hernia but has never had pain this bad in the past.  Patient states that she feels that this area has become much more full and tender to palpation. MODIFYING FACTORS: Palpation worsens this pain and she denies any relieving factors ASSOCIATED SYMPTOMS: Nausea, vomiting   Per medical record review, patient has history of hysterectomy and partial bowel resection secondary to diverticulitis          Past Medical History:  Diagnosis Date   Anginal pain (HCC) 09/06/2019   last episode 2 months ago   Arthritis    Bowel obstruction (HCC)    CHF (congestive heart failure) (HCC)    Depression    Diverticulitis    Dyspnea    GERD (gastroesophageal reflux disease)    Gout    Gout    Heart murmur    Heart valve disorder    HLD (hyperlipidemia)    HTN (hypertension)    Hypothyroidism    Neuropathy    Presence of permanent cardiac pacemaker 1/26/201   [employer] [id number]   Wears dentures    partial upper, full lower    Patient Active Problem List   Diagnosis Date Noted   Incarcerated right inguinal hernia    Malnutrition of moderate degree 07/01/2018   GI bleed 05/26/2015   HTN (hypertension) 05/26/2015   Hypothyroid 05/26/2015   Chronic diastolic CHF (congestive heart failure) (HCC) 05/26/2015   AKI (acute kidney injury) (HCC) 05/26/2015    Past Surgical  History:  Procedure Laterality Date   ABDOMINAL HYSTERECTOMY     ABDOMINAL SURGERY     CATARACT EXTRACTION W/PHACO Left 09/15/2019   Procedure: CATARACT EXTRACTION PHACO AND INTRAOCULAR LENS PLACEMENT (IOC) LEFT;  Surgeon: Galen Manila, MD;  Location: [facility] ORS;  Service: Ophthalmology;  Laterality: Left;  Lot # [id number] H [place]: 00:38.8 CDE: 6.81   CATARACT EXTRACTION W/PHACO Right 10/13/2019   Procedure: CATARACT EXTRACTION PHACO AND INTRAOCULAR LENS PLACEMENT (IOC) RIGHT;  Surgeon: Galen Manila, MD;  Location: [facility] ORS;  Service: Ophthalmology;  Laterality: Right;  US00:35.5 CDE5.35 [id number]   COLON SURGERY     colon resection (for blockage)  with hernia repair   HERNIA REPAIR     and colon resection   IR ANGIOGRAM VISCERAL SELECTIVE  06/28/2018   PACEMAKER INSERTION     PPM GENERATOR CHANGEOUT N/A 07/04/2020   Procedure: PPM GENERATOR CHANGEOUT;  Surgeon: Marcina Millard, MD;  Location: [facility] INVASIVE CV LAB;  Service: Cardiovascular;  Laterality: N/A;   THYROIDECTOMY      Prior to Admission medications   Medication Sig Start Date End Date Taking? Authorizing Provider  ALPRAZolam Prudy Feeler) 0.25 MG tablet Take 0.25 mg by mouth daily as needed for anxiety.    [provider]  amLODipine (NORVASC) 5 MG tablet Take 5 mg by mouth daily at 12 noon. 05/02/15   [provider]  aspirin EC 81 MG tablet Take 81 mg by mouth daily at 12  noon.    [provider]  atenolol (TENORMIN) 50 MG tablet Take 50 mg by mouth daily at 12 noon. 03/22/15   [provider]  escitalopram (LEXAPRO) 5 MG tablet Take 5 mg by mouth daily at 12 noon. 06/04/18 09/01/20  [provider]  ferrous sulfate 325 (65 FE) MG tablet Take 1 tablet (325 mg total) by mouth 2 (two) times daily with a meal. 05/29/15   [name], MD  gabapentin (NEURONTIN) 100 MG capsule Take 100 mg by mouth 3 (three) times daily.  03/22/15   [provider]  hydrochlorothiazide  (HYDRODIURIL) 25 MG tablet Take 25 mg by mouth daily at 12 noon. 10/24/15   [provider]  isosorbide mononitrate (IMDUR) 30 MG 24 hr tablet Take 30 mg by mouth daily.  05/11/15   [provider]  lisinopril (PRINIVIL,ZESTRIL) 40 MG tablet Take 40 mg by mouth daily at 12 noon. 05/11/15   [provider]  meloxicam (MOBIC) 7.5 MG tablet Take 7.5 mg by mouth daily as needed for pain.     [provider]  Multiple Vitamin (MULTIVITAMIN WITH MINERALS) TABS tablet Take 1 tablet by mouth daily.    [provider]  pantoprazole (PROTONIX) 40 MG tablet Take 40 mg by mouth 2 (two) times daily.  04/21/18   [provider]  senna (SENOKOT) 8.6 MG tablet Take 1 tablet by mouth daily as needed for constipation.    [provider]  SYNTHROID 75 MCG tablet Take 75 mcg by mouth daily at 6 (six) AM. (0500) 05/22/15   [provider]    Allergies Propranolol and Shellfish allergy  Family History  Problem Relation Age of Onset   Thyroid disease Mother    Hypertension Sister    Thyroid disease Sister    Heart attack Brother    Cancer Brother    Colon cancer Sister     Social History Social History   Tobacco Use   Smoking status: Former    Types: Cigarettes    Quit date: 1988    Years since quitting: 34.7   Smokeless tobacco: Never  Vaping Use   Vaping Use: Never used  Substance Use Topics   Alcohol use: No    Alcohol/week: 0.0 standard drinks   Drug use: No    Review of Systems Constitutional: No fever/chills Eyes: No visual changes. ENT: No sore throat. Cardiovascular: Denies chest pain. Respiratory: Denies shortness of breath. Gastrointestinal: Endorses abdominal pain, nausea, and vomiting.  No diarrhea. Genitourinary: Negative for dysuria. Musculoskeletal: Negative for acute arthralgias Skin: Negative for rash. Neurological: Negative for headaches, weakness/numbness/paresthesias in any extremity Psychiatric:  Negative for suicidal ideation/homicidal ideation   ____________________________________________   PHYSICAL EXAM:  VITAL SIGNS: ED Triage Vitals  Enc Vitals Group     BP 01/17/21 0813 (!) 173/111     Pulse Rate 01/17/21 0813 63     Resp 01/17/21 0813 20     Temp 01/17/21 0813 98.4 F (36.9 C)     Temp Source 01/17/21 0813 Oral     SpO2 01/17/21 0813 99 %     Weight 01/17/21 0814 143 lb 4.8 oz (65 kg)     Height 01/17/21 0814 5\' 10"  (1.778 m)     Head Circumference --      Peak Flow --      Pain Score 01/17/21 0814 10     Pain Loc --      Pain Edu? --      Excl.  in GC? --    Constitutional: Alert and oriented. Well appearing and in no acute distress. Eyes: Conjunctivae are normal. PERRL. Head: Atraumatic. Nose: No congestion/rhinnorhea. Mouth/Throat: Mucous membranes are moist. Neck: No stridor Cardiovascular: Grossly normal heart sounds.  Good peripheral circulation. Respiratory: Normal respiratory effort.  No retractions. Gastrointestinal: Large 10 cm diameter palpable mass in the right lower quadrant with audible and palpable bowel gas.  Patient unable to tolerate palpation secondary to pain.  No distention. Musculoskeletal: No obvious deformities Neurologic:  Normal speech and language. No gross focal neurologic deficits are appreciated. Skin:  Skin is warm and dry. No rash noted. Psychiatric: Mood and affect are normal. Speech and behavior are normal.  ____________________________________________   LABS (all labs ordered are listed, but only abnormal results are displayed)  Labs Reviewed  COMPREHENSIVE METABOLIC PANEL - Abnormal; Notable for the following components:      Result Value   Glucose, Bld 142 (*)    BUN 28 (*)    Creatinine, Ser 1.24 (*)    GFR, Estimated 40 (*)    All other components within normal limits  CBC - Abnormal; Notable for the following components:   WBC 13.6 (*)    All other components within normal limits  URINALYSIS, COMPLETE  (UACMP) WITH MICROSCOPIC - Abnormal; Notable for the following components:   Color, Urine STRAW (*)    APPearance CLEAR (*)    Protein, ur 100 (*)    All other components within normal limits  LACTIC ACID, PLASMA - Abnormal; Notable for the following components:   Lactic Acid, Venous 2.2 (*)    All other components within normal limits  TROPONIN I (HIGH SENSITIVITY) - Abnormal; Notable for the following components:   Troponin I (High Sensitivity) 30 (*)    All other components within normal limits  TROPONIN I (HIGH SENSITIVITY) - Abnormal; Notable for the following components:   Troponin I (High Sensitivity) 35 (*)    All other components within normal limits  CULTURE, BLOOD (ROUTINE X 2)  CULTURE, BLOOD (ROUTINE X 2)  LIPASE, BLOOD  LACTIC ACID, PLASMA   ____________________________________________  EKG  ED ECG REPORT I, Merwyn Katos, the attending physician, personally viewed and interpreted this ECG.  Date: 01/17/2021 EKG Time: 0815 Rate: 64 Rhythm: Atrially paced rhythm QRS Axis: normal Intervals: normal ST/T Wave abnormalities: normal Narrative Interpretation: Atrially paced rhythm.  No evidence of acute ischemia  ____________________________________________  RADIOLOGY  ED MD interpretation: CT of the abdomen pelvis with IV contrast shows large right groin hernia containing multiple small loops with dilated small bowel in the left abdomen tracking down to the hernia that is markedly distended and fluid-filled  Official radiology report(s): CT Abdomen Pelvis W Contrast  Result Date: 01/17/2021 CLINICAL DATA:  Epigastric pain. EXAM: CT ABDOMEN AND PELVIS WITH CONTRAST TECHNIQUE: Multidetector CT imaging of the abdomen and pelvis was performed using the standard protocol following bolus administration of intravenous contrast. CONTRAST:  14mL OMNIPAQUE IOHEXOL 350 MG/ML SOLN COMPARISON:  To 10/23/2018 FINDINGS: Lower chest: Unremarkable. Hepatobiliary: No suspicious  focal abnormality within the liver parenchyma. There is no evidence for gallstones, gallbladder wall thickening, or pericholecystic fluid. Common bile duct measures upper normal diameter at 6 mm. Pancreas: Diffuse dilatation of the main pancreatic duct, similar to prior without mass lesion or peripancreatic edema. Spleen: No splenomegaly. No focal mass lesion. Adrenals/Urinary Tract: No adrenal nodule or mass. Multiple lesions of varying size and attenuation are identified in both kidneys, similar to prior and compatible  with simple and complex cysts containing varying degrees of proteinaceous debris/hemorrhage. Some of the cysts demonstrate peripheral calcification, as before. No overtly suspicious enhancing mass lesion in either kidney. No evidence for hydroureter. The urinary bladder appears normal for the degree of distention. Stomach/Bowel: Stomach is decompressed. Duodenum is normally positioned as is the ligament of Treitz. Small bowel loops in the left abdomen are dilated up to 3.1 cm diameter. There is a large right groin hernia containing small bowel loops including a markedly distended 3.5 cm fluid-filled loop of small bowel the dilated small bowel loops in the left abdomen track out into the hernia with decompressed small bowel tracking back into the peritoneal cavity. Diffuse diverticular disease is seen in the colon. Vascular/Lymphatic: There is moderate atherosclerotic calcification of the abdominal aorta without aneurysm. There is no gastrohepatic or hepatoduodenal ligament lymphadenopathy. No retroperitoneal or mesenteric lymphadenopathy. No pelvic sidewall lymphadenopathy. Reproductive: No adnexal mass. Other: Small volume free fluid seen in the pelvis. Musculoskeletal: No worrisome lytic or sclerotic osseous abnormality. IMPRESSION: 1. Large right groin hernia containing small bowel loops. Dilated small bowel in the left abdomen tracks down into the hernia where a markedly distended fluid-filled  loop of small bowel is identified with additional decompressed small bowel loops tracking back into the peritoneal cavity. Colon is nondilated. Imaging features compatible with small bowel obstruction secondary to incarcerated right groin hernia. 2. Small volume free fluid in the pelvis. 3. Multiple bilateral simple and complex renal cysts containing varying degrees of proteinaceous debris/hemorrhage. 4. Diffuse dilatation of the main pancreatic duct, similar to prior. 5. Aortic Atherosclerosis (ICD10-I70.0). Electronically Signed   By: Kennith Center M.D.   On: 01/17/2021 12:05    ____________________________________________   PROCEDURES  Procedure(s) performed (including Critical Care):  Procedures   ____________________________________________   INITIAL IMPRESSION / ASSESSMENT AND PLAN / ED COURSE  As part of my medical decision making, I reviewed the following data within the electronic medical record, if available:  Nursing notes reviewed and incorporated, Labs reviewed, EKG interpreted, Old chart reviewed, Radiograph reviewed and Notes from prior ED visits reviewed and incorporated      Patient presents with a right inguinal hernia with evidence of possible strangulation and/or bowel obstruction.  At this time, patient is afebrile, well-appearing, and without evidence of of severe incarceration.  Patient does have evidence of leukocytosis and mild lactic acidosis.  I spoke to Dr. Aleen Campi and general surgery who was able to reduce this hernia without incident and has graciously agreed to see patient in clinic early this next week.  Patient agrees with plan and feels much better after hernia is reduced.  Patient is p.o. tolerant prior to discharge.  Patient's pain is well controlled prior to discharge.  The patient has been reexamined and is ready to be discharged.  All diagnostic results have been reviewed and discussed with the patient/family.  Care plan has been outlined and the  patient/family understands all current diagnoses, results, and treatment plans.  There are no new complaints, changes, or physical findings at this time.  All questions have been addressed and answered.  Patient was instructed to, and agrees to follow-up with their primary care physician as well as return to the emergency department if any new or worsening symptoms develop.  : Discharge home with general surgery follow-up     ____________________________________________   FINAL CLINICAL IMPRESSION(S) / ED DIAGNOSES  Final diagnoses:  Intestinal obstruction without gangrene due to recurrent right inguinal hernia  Right lower quadrant abdominal  pain  Nausea     ED Discharge Orders     None        Note:  This document was prepared using Dragon voice recognition software and may include unintentional dictation errors.    Merwyn Katos, MD 01/17/21 [id number]

## 2021-01-17 NOTE — Consult Note (Signed)
Date of Consultation:  01/17/2021  Requesting Physician:  Donna Bernard, MD  Reason for Consultation:  Incarcerated right inguinal hernia  History of Present Illness: Vanessa Lang is a 87 y.o. female presenting to the ED for evaluation of an incarcerated right inguinal hernia.  She has a history of prior incarcerated and strangulated right inguinal hernia which required emergency surgery in 2015 with Dr. Juliann Pulse.  This was a hybrid right inguinal incision and lower midline exploratory laparotomy incision and required small bowel resection of a necrotic portion, and tissue repair only due to the bowel resection.  It is unclear how soon after the hernia recurred, but she presents with worsening right groin pain since last night.  The pain is strictly located in the right groin, and it is associated with some nausea and spitting, but no true emesis.  She also reports no flatus or bowel movement today.  Denies any fevers, chills, chest pain, shortness of breath.  In the ED, her workup revealed a WBC of 13.6, Hgb of 12.9, Cr of 1.24, and lactic acid of 2.2.  She had a CT scan of the abdomen and pelvis which showed a large incarcerated right inguinal hernia causing small bowel obstruction, with both decompressed and dilated segments of small bowel within the hernia.  She has history of HTN, HLP, CHF, Pacemaker placement, and is also s/p prior right inguinal hernia/exlap as mentioned above in 2015, thyroidectomy, and hysterectomy.  She also has had admissions for GI bleed related to diverticular bleed.  Past Medical History: Past Medical History:  Diagnosis Date   Anginal pain (HCC) 09/06/2019   last episode 2 months ago   Arthritis    Bowel obstruction (HCC)    CHF (congestive heart failure) (HCC)    Depression    Diverticulitis    Dyspnea    GERD (gastroesophageal reflux disease)    Gout    Gout    Heart murmur    Heart valve disorder    HLD (hyperlipidemia)    HTN (hypertension)     Hypothyroidism    Neuropathy    Presence of permanent cardiac pacemaker 1/26/201   [employer] [id number]   Wears dentures    partial upper, full lower     Past Surgical History: Past Surgical History:  Procedure Laterality Date   ABDOMINAL HYSTERECTOMY     ABDOMINAL SURGERY     CATARACT EXTRACTION W/PHACO Left 09/15/2019   Procedure: CATARACT EXTRACTION PHACO AND INTRAOCULAR LENS PLACEMENT (IOC) LEFT;  Surgeon: Galen Manila, MD;  Location: [facility] OR[facility];  Service: Ophthalmology;  Laterality: Left;  Lot # [id number] H [place]: 00:38.8 CDE: 6.81   CATARACT EXTRACTION W/PHACO Right 10/13/2019   Procedure: CATARACT EXTRACTION PHACO AND INTRAOCULAR LENS PLACEMENT (IOC) RIGHT;  Surgeon: Galen Manila, MD;  Location: [facility];  Service: Ophthalmology;  Laterality: Right;  US00:35.5 CDE5.35 [id number] H   COLON SURGERY     colon resection (for blockage)  with hernia repair   HERNIA REPAIR     and colon resection   IR ANGIOGRAM VISCERAL SELECTIVE  06/28/2018   PACEMAKER INSERTION     PPM GENERATOR CHANGEOUT N/A 07/04/2020   Procedure: PPM GENERATOR CHANGEOUT;  Surgeon: Marcina Millard, MD;  Location: [facility];  Service: Cardiovascular;  Laterality: N/A;   THYROIDECTOMY      Home Medications: Prior to Admission medications   Medication Sig Start Date End Date Taking? Authorizing Provider  ALPRAZolam Prudy Feeler) 0.25 MG tablet Take 0.25 mg by mouth daily as needed for anxiety.  [provider]  amLODipine (NORVASC) 5 MG tablet Take 5 mg by mouth daily at 12 noon. 05/02/15   [provider]  aspirin EC 81 MG tablet Take 81 mg by mouth daily at 12 noon.    [provider]  atenolol (TENORMIN) 50 MG tablet Take 50 mg by mouth daily at 12 noon. 03/22/15   [provider]  escitalopram (LEXAPRO) 5 MG tablet Take 5 mg by mouth daily at 12 noon. 06/04/18 09/01/20  [provider]  ferrous sulfate 325 (65 FE) MG tablet Take 1 tablet (325 mg total) by  mouth 2 (two) times daily with a meal. 05/29/15   [name], MD  gabapentin (NEURONTIN) 100 MG capsule Take 100 mg by mouth 3 (three) times daily.  03/22/15   [provider]  hydrochlorothiazide (HYDRODIURIL) 25 MG tablet Take 25 mg by mouth daily at 12 noon. 10/24/15   [provider]  isosorbide mononitrate (IMDUR) 30 MG 24 hr tablet Take 30 mg by mouth daily.  05/11/15   [provider]  lisinopril (PRINIVIL,ZESTRIL) 40 MG tablet Take 40 mg by mouth daily at 12 noon. 05/11/15   [provider]  meloxicam (MOBIC) 7.5 MG tablet Take 7.5 mg by mouth daily as needed for pain.     [provider]  Multiple Vitamin (MULTIVITAMIN WITH MINERALS) TABS tablet Take 1 tablet by mouth daily.    [provider]  pantoprazole (PROTONIX) 40 MG tablet Take 40 mg by mouth 2 (two) times daily.  04/21/18   [provider]  senna (SENOKOT) 8.6 MG tablet Take 1 tablet by mouth daily as needed for constipation.    [provider]  SYNTHROID 75 MCG tablet Take 75 mcg by mouth daily at 6 (six) AM. (0500) 05/22/15   [provider]    Allergies: Allergies  Allergen Reactions   Propranolol Rash   Shellfish Allergy     gout    Social History:  reports that she quit smoking about 34 years ago. Her smoking use included cigarettes. She has never used smokeless tobacco. She reports that she does not drink alcohol and does not use drugs.   Family History: Family History  Problem Relation Age of Onset   Thyroid disease Mother    Hypertension Sister    Thyroid disease Sister    Heart attack Brother    Cancer Brother    Colon cancer Sister     Review of Systems: Review of Systems  Constitutional:  Negative for chills and fever.  HENT:  Negative for hearing loss.   Respiratory:  Negative for shortness of breath.   Cardiovascular:  Negative for chest pain.  Gastrointestinal:  Positive for abdominal pain and nausea. Negative  for blood in stool, constipation, diarrhea and vomiting.  Genitourinary:  Negative for dysuria.  Musculoskeletal:  Negative for myalgias.  Skin:  Negative for rash.  Neurological:  Negative for dizziness.  Psychiatric/Behavioral:  Negative for depression.    Physical Exam BP (!) 164/74   Pulse (!) 59   Temp 98.4 F (36.9 C) (Oral)   Resp 19   Ht 5\' 10"  (1.778 m)   Wt 65 kg   SpO2 98%   BMI 20.56 kg/m  CONSTITUTIONAL: No acute distress, non-toxic. HEENT:  Normocephalic, atraumatic, extraocular motion intact. NECK: Trachea is midline, and there is no jugular venous distension. RESPIRATORY:  Normal respiratory effort without pathologic use of accessory muscles. CARDIOVASCULAR: Regular rhythm and rate, on pacemaker. GI: The abdomen is  soft, non-distended, non-tender.  The right groin has a large right inguinal hernia containing small bowel, which has some soft and some hard components.  This is tender to palpation.. Prior right inguinal incision and low midline incisions are well healed. MUSCULOSKELETAL:  Normal muscle strength and tone in all four extremities.  No peripheral edema or cyanosis. SKIN: Skin turgor is normal. There are no pathologic skin lesions.  NEUROLOGIC:  Motor and sensation is grossly normal.  Cranial nerves are grossly intact. PSYCH:  Alert and oriented to person, place and time. Affect is normal.  Laboratory Analysis: Results for orders placed or performed during the hospital encounter of 01/17/21 (from the past 24 hour(s))  Lipase, blood     Status: None   Collection Time: 01/17/21  8:16 AM  Result Value Ref Range   Lipase 29 11 - 51 U/L  Comprehensive metabolic panel     Status: Abnormal   Collection Time: 01/17/21  8:16 AM  Result Value Ref Range   Sodium 136 135 - 145 mmol/L   Potassium 3.8 3.5 - 5.1 mmol/L   Chloride 99 98 - 111 mmol/L   CO2 27 22 - 32 mmol/L   Glucose, Bld 142 (H) 70 - 99 mg/dL   BUN 28 (H) 8 - 23 mg/dL   Creatinine, Ser 7.89 (H)  0.44 - 1.00 mg/dL   Calcium 9.8 8.9 - 38.1 mg/dL   Total Protein 8.1 6.5 - 8.1 g/dL   Albumin 4.7 3.5 - 5.0 g/dL   AST 25 15 - 41 U/L   ALT 14 0 - 44 U/L   Alkaline Phosphatase 68 38 - 126 U/L   Total Bilirubin 0.7 0.3 - 1.2 mg/dL   GFR, Estimated 40 (L) >60 mL/min   Anion gap 10 5 - 15  CBC     Status: Abnormal   Collection Time: 01/17/21  8:16 AM  Result Value Ref Range   WBC 13.6 (H) 4.0 - 10.5 K/uL   RBC 3.99 3.87 - 5.11 MIL/uL   Hemoglobin 12.9 12.0 - 15.0 g/dL   HCT 01.7 51.0 - 25.8 %   MCV 96.2 80.0 - 100.0 fL   MCH 32.3 26.0 - 34.0 pg   MCHC 33.6 30.0 - 36.0 g/dL   RDW 52.7 78.2 - 42.3 %   Platelets 192 150 - 400 K/uL   nRBC 0.0 0.0 - 0.2 %  Troponin I (High Sensitivity)     Status: Abnormal   Collection Time: 01/17/21  8:16 AM  Result Value Ref Range   Troponin I (High Sensitivity) 30 (H) <18 ng/L  Urinalysis, Complete w Microscopic     Status: Abnormal   Collection Time: 01/17/21 10:59 AM  Result Value Ref Range   Color, Urine STRAW (A) YELLOW   APPearance CLEAR (A) CLEAR   Specific Gravity, Urine 1.018 1.005 - 1.030   pH 7.0 5.0 - 8.0   Glucose, UA NEGATIVE NEGATIVE mg/dL   Hgb urine dipstick NEGATIVE NEGATIVE   Bilirubin Urine NEGATIVE NEGATIVE   Ketones, ur NEGATIVE NEGATIVE mg/dL   Protein, ur 536 (A) NEGATIVE mg/dL   Nitrite NEGATIVE NEGATIVE   Leukocytes,Ua NEGATIVE NEGATIVE   RBC / HPF 0-5 0 - 5 RBC/hpf   WBC, UA 0-5 0 - 5 WBC/hpf   Bacteria, UA NONE SEEN NONE SEEN   Squamous Epithelial / LPF NONE SEEN 0 - 5  Troponin I (High Sensitivity)     Status: Abnormal   Collection Time: 01/17/21 10:59 AM  Result Value Ref  Range   Troponin I (High Sensitivity) 35 (H) <18 ng/L  Lactic acid, plasma     Status: Abnormal   Collection Time: 01/17/21 12:53 PM  Result Value Ref Range   Lactic Acid, Venous 2.2 (HH) 0.5 - 1.9 mmol/L    Imaging: CT Abdomen Pelvis W Contrast  Result Date: 01/17/2021 CLINICAL DATA:  Epigastric pain. EXAM: CT ABDOMEN AND PELVIS  WITH CONTRAST TECHNIQUE: Multidetector CT imaging of the abdomen and pelvis was performed using the standard protocol following bolus administration of intravenous contrast. CONTRAST:  35mL OMNIPAQUE IOHEXOL 350 MG/ML SOLN COMPARISON:  To 10/23/2018 FINDINGS: Lower chest: Unremarkable. Hepatobiliary: No suspicious focal abnormality within the liver parenchyma. There is no evidence for gallstones, gallbladder wall thickening, or pericholecystic fluid. Common bile duct measures upper normal diameter at 6 mm. Pancreas: Diffuse dilatation of the main pancreatic duct, similar to prior without mass lesion or peripancreatic edema. Spleen: No splenomegaly. No focal mass lesion. Adrenals/Urinary Tract: No adrenal nodule or mass. Multiple lesions of varying size and attenuation are identified in both kidneys, similar to prior and compatible with simple and complex cysts containing varying degrees of proteinaceous debris/hemorrhage. Some of the cysts demonstrate peripheral calcification, as before. No overtly suspicious enhancing mass lesion in either kidney. No evidence for hydroureter. The urinary bladder appears normal for the degree of distention. Stomach/Bowel: Stomach is decompressed. Duodenum is normally positioned as is the ligament of Treitz. Small bowel loops in the left abdomen are dilated up to 3.1 cm diameter. There is a large right groin hernia containing small bowel loops including a markedly distended 3.5 cm fluid-filled loop of small bowel the dilated small bowel loops in the left abdomen track out into the hernia with decompressed small bowel tracking back into the peritoneal cavity. Diffuse diverticular disease is seen in the colon. Vascular/Lymphatic: There is moderate atherosclerotic calcification of the abdominal aorta without aneurysm. There is no gastrohepatic or hepatoduodenal ligament lymphadenopathy. No retroperitoneal or mesenteric lymphadenopathy. No pelvic sidewall lymphadenopathy. Reproductive:  No adnexal mass. Other: Small volume free fluid seen in the pelvis. Musculoskeletal: No worrisome lytic or sclerotic osseous abnormality. IMPRESSION: 1. Large right groin hernia containing small bowel loops. Dilated small bowel in the left abdomen tracks down into the hernia where a markedly distended fluid-filled loop of small bowel is identified with additional decompressed small bowel loops tracking back into the peritoneal cavity. Colon is nondilated. Imaging features compatible with small bowel obstruction secondary to incarcerated right groin hernia. 2. Small volume free fluid in the pelvis. 3. Multiple bilateral simple and complex renal cysts containing varying degrees of proteinaceous debris/hemorrhage. 4. Diffuse dilatation of the main pancreatic duct, similar to prior. 5. Aortic Atherosclerosis (ICD10-I70.0). Electronically Signed   By: Kennith Center M.D.   On: 01/17/2021 12:05    Assessment and Plan: This is a 87 y.o. female with a recurrent incarcerated right inguinal hernia.  --After the patient received IV pain medication, I was able to carefully and slowly reduce the hernia.  There was some discomfort in doing so, but after further examination and at the end of my evaluation, the hernia remained soft with only small amount of bulging without further tenderness.  Her abdomen was also soft and without tenderness after reducing her hernia. --Given her medical comorbidities and the fact that her hernia was successfully reduced, I discussed with the patient that she does not need emergency surgery right now.  However, discussed with her the my concern is that this could recur again and potentially truly  be in an emergent scenario.  I would recommend surgical repair in elective fashion.  I will set up an appointment with the patient on 01/21/21 so that we can evaluate her again and discuss elective surgery scheduling.  In the meantime, will send clearance paperwork for her PCP and Cardiologist.  She  understands that she may need further workup from her doctors prior to surgery.  After further discussion, she agreed with this plan and understands the reasoning behind recommending surgery.   --For now, have also instructed her and her friend to get a hernia belt or underwear that she can use to help place some pressure in the right groin area to prevent any more significant bulging.  Given also that the CT scan showed some distention of small bowel, advised her to remain on clear liquid diet today, and slowly advance starting tomorrow.  Return precautions also given to come back to the ED if any worsening pain, nausea, vomiting, worsening bulging which is hard and very tender, or other concerns.  Face-to-face time spent with the patient and care providers was 80 minutes, with more than 50% of the time spent counseling, educating, and coordinating care of the patient.     Howie Ill, MD [facility] Pg:  [phone]

## 2021-01-17 NOTE — Telephone Encounter (Signed)
Faxed cardiac clearance to Dr. Alexander Paraschos at [phone]. 

## 2021-01-19 ENCOUNTER — Inpatient Hospital Stay: Payer: Medicare Other | Admitting: Anesthesiology

## 2021-01-19 ENCOUNTER — Encounter: Admission: EM | Disposition: E | Payer: Self-pay | Source: Home / Self Care | Attending: Surgery

## 2021-01-19 ENCOUNTER — Encounter: Payer: Self-pay | Admitting: Emergency Medicine

## 2021-01-19 ENCOUNTER — Inpatient Hospital Stay: Payer: Medicare Other

## 2021-01-19 ENCOUNTER — Inpatient Hospital Stay
Admission: EM | Admit: 2021-01-19 | Discharge: 2021-02-02 | DRG: 329 | Disposition: E | Payer: Medicare Other | Attending: Internal Medicine | Admitting: Internal Medicine

## 2021-01-19 DIAGNOSIS — Z8249 Family history of ischemic heart disease and other diseases of the circulatory system: Secondary | ICD-10-CM

## 2021-01-19 DIAGNOSIS — R0603 Acute respiratory distress: Secondary | ICD-10-CM | POA: Diagnosis not present

## 2021-01-19 DIAGNOSIS — Z800 Family history of malignant neoplasm of digestive organs: Secondary | ICD-10-CM

## 2021-01-19 DIAGNOSIS — I5032 Chronic diastolic (congestive) heart failure: Secondary | ICD-10-CM | POA: Diagnosis present

## 2021-01-19 DIAGNOSIS — Z9911 Dependence on respirator [ventilator] status: Secondary | ICD-10-CM

## 2021-01-19 DIAGNOSIS — Z20822 Contact with and (suspected) exposure to covid-19: Secondary | ICD-10-CM | POA: Diagnosis present

## 2021-01-19 DIAGNOSIS — R109 Unspecified abdominal pain: Secondary | ICD-10-CM | POA: Diagnosis present

## 2021-01-19 DIAGNOSIS — Z950 Presence of cardiac pacemaker: Secondary | ICD-10-CM

## 2021-01-19 DIAGNOSIS — E872 Acidosis: Secondary | ICD-10-CM | POA: Diagnosis present

## 2021-01-19 DIAGNOSIS — N179 Acute kidney failure, unspecified: Secondary | ICD-10-CM | POA: Diagnosis present

## 2021-01-19 DIAGNOSIS — I471 Supraventricular tachycardia: Secondary | ICD-10-CM | POA: Diagnosis not present

## 2021-01-19 DIAGNOSIS — Z66 Do not resuscitate: Secondary | ICD-10-CM | POA: Diagnosis not present

## 2021-01-19 DIAGNOSIS — K56609 Unspecified intestinal obstruction, unspecified as to partial versus complete obstruction: Secondary | ICD-10-CM | POA: Diagnosis present

## 2021-01-19 DIAGNOSIS — Z681 Body mass index (BMI) 19 or less, adult: Secondary | ICD-10-CM

## 2021-01-19 DIAGNOSIS — J9602 Acute respiratory failure with hypercapnia: Secondary | ICD-10-CM | POA: Diagnosis present

## 2021-01-19 DIAGNOSIS — I700 Atherosclerosis of aorta: Secondary | ICD-10-CM | POA: Diagnosis present

## 2021-01-19 DIAGNOSIS — K219 Gastro-esophageal reflux disease without esophagitis: Secondary | ICD-10-CM | POA: Diagnosis present

## 2021-01-19 DIAGNOSIS — G9341 Metabolic encephalopathy: Secondary | ICD-10-CM | POA: Diagnosis present

## 2021-01-19 DIAGNOSIS — M109 Gout, unspecified: Secondary | ICD-10-CM | POA: Diagnosis present

## 2021-01-19 DIAGNOSIS — I472 Ventricular tachycardia: Secondary | ICD-10-CM | POA: Diagnosis not present

## 2021-01-19 DIAGNOSIS — R54 Age-related physical debility: Secondary | ICD-10-CM | POA: Diagnosis present

## 2021-01-19 DIAGNOSIS — J9601 Acute respiratory failure with hypoxia: Secondary | ICD-10-CM | POA: Diagnosis present

## 2021-01-19 DIAGNOSIS — K5791 Diverticulosis of intestine, part unspecified, without perforation or abscess with bleeding: Secondary | ICD-10-CM | POA: Diagnosis present

## 2021-01-19 DIAGNOSIS — K55029 Acute infarction of small intestine, extent unspecified: Secondary | ICD-10-CM | POA: Diagnosis present

## 2021-01-19 DIAGNOSIS — R579 Shock, unspecified: Secondary | ICD-10-CM | POA: Diagnosis not present

## 2021-01-19 DIAGNOSIS — E785 Hyperlipidemia, unspecified: Secondary | ICD-10-CM | POA: Diagnosis present

## 2021-01-19 DIAGNOSIS — Z7189 Other specified counseling: Secondary | ICD-10-CM | POA: Diagnosis not present

## 2021-01-19 DIAGNOSIS — N1832 Chronic kidney disease, stage 3b: Secondary | ICD-10-CM | POA: Diagnosis present

## 2021-01-19 DIAGNOSIS — Z978 Presence of other specified devices: Secondary | ICD-10-CM

## 2021-01-19 DIAGNOSIS — E43 Unspecified severe protein-calorie malnutrition: Secondary | ICD-10-CM | POA: Diagnosis present

## 2021-01-19 DIAGNOSIS — F32A Depression, unspecified: Secondary | ICD-10-CM | POA: Diagnosis present

## 2021-01-19 DIAGNOSIS — I350 Nonrheumatic aortic (valve) stenosis: Secondary | ICD-10-CM | POA: Diagnosis present

## 2021-01-19 DIAGNOSIS — K4031 Unilateral inguinal hernia, with obstruction, without gangrene, recurrent: Secondary | ICD-10-CM | POA: Diagnosis present

## 2021-01-19 DIAGNOSIS — E890 Postprocedural hypothyroidism: Secondary | ICD-10-CM | POA: Diagnosis present

## 2021-01-19 DIAGNOSIS — K4030 Unilateral inguinal hernia, with obstruction, without gangrene, not specified as recurrent: Secondary | ICD-10-CM

## 2021-01-19 DIAGNOSIS — E861 Hypovolemia: Secondary | ICD-10-CM | POA: Diagnosis present

## 2021-01-19 DIAGNOSIS — E876 Hypokalemia: Secondary | ICD-10-CM | POA: Diagnosis not present

## 2021-01-19 DIAGNOSIS — E162 Hypoglycemia, unspecified: Secondary | ICD-10-CM | POA: Diagnosis not present

## 2021-01-19 DIAGNOSIS — Z8349 Family history of other endocrine, nutritional and metabolic diseases: Secondary | ICD-10-CM

## 2021-01-19 DIAGNOSIS — Z515 Encounter for palliative care: Secondary | ICD-10-CM | POA: Diagnosis not present

## 2021-01-19 DIAGNOSIS — K562 Volvulus: Secondary | ICD-10-CM | POA: Diagnosis present

## 2021-01-19 DIAGNOSIS — R0600 Dyspnea, unspecified: Secondary | ICD-10-CM

## 2021-01-19 DIAGNOSIS — E039 Hypothyroidism, unspecified: Secondary | ICD-10-CM | POA: Diagnosis present

## 2021-01-19 DIAGNOSIS — I4891 Unspecified atrial fibrillation: Secondary | ICD-10-CM | POA: Diagnosis not present

## 2021-01-19 DIAGNOSIS — I130 Hypertensive heart and chronic kidney disease with heart failure and stage 1 through stage 4 chronic kidney disease, or unspecified chronic kidney disease: Secondary | ICD-10-CM | POA: Diagnosis present

## 2021-01-19 DIAGNOSIS — I495 Sick sinus syndrome: Secondary | ICD-10-CM | POA: Diagnosis present

## 2021-01-19 DIAGNOSIS — Z90710 Acquired absence of both cervix and uterus: Secondary | ICD-10-CM

## 2021-01-19 DIAGNOSIS — F419 Anxiety disorder, unspecified: Secondary | ICD-10-CM | POA: Diagnosis present

## 2021-01-19 DIAGNOSIS — Z87891 Personal history of nicotine dependence: Secondary | ICD-10-CM

## 2021-01-19 DIAGNOSIS — K660 Peritoneal adhesions (postprocedural) (postinfection): Secondary | ICD-10-CM | POA: Diagnosis present

## 2021-01-19 DIAGNOSIS — I499 Cardiac arrhythmia, unspecified: Secondary | ICD-10-CM | POA: Diagnosis not present

## 2021-01-19 DIAGNOSIS — Z4659 Encounter for fitting and adjustment of other gastrointestinal appliance and device: Secondary | ICD-10-CM

## 2021-01-19 DIAGNOSIS — Z95828 Presence of other vascular implants and grafts: Secondary | ICD-10-CM

## 2021-01-19 DIAGNOSIS — N189 Chronic kidney disease, unspecified: Secondary | ICD-10-CM | POA: Diagnosis not present

## 2021-01-19 HISTORY — PX: LAPAROTOMY: SHX154

## 2021-01-19 HISTORY — PX: BOWEL RESECTION: SHX1257

## 2021-01-19 HISTORY — PX: INGUINAL HERNIA REPAIR: SHX194

## 2021-01-19 LAB — CBC
HCT: 40.9 % (ref 36.0–46.0)
HCT: 35.9 % — ABNORMAL LOW (ref 36.0–46.0)
Hemoglobin: 13.2 g/dL (ref 12.0–15.0)
Hemoglobin: 12.2 g/dL (ref 12.0–15.0)
MCH: 30.6 pg (ref 26.0–34.0)
MCH: 32.4 pg (ref 26.0–34.0)
MCHC: 32.3 g/dL (ref 30.0–36.0)
MCHC: 34 g/dL (ref 30.0–36.0)
MCV: 94.9 fL (ref 80.0–100.0)
MCV: 95.5 fL (ref 80.0–100.0)
Platelets: 220 10*3/uL (ref 150–400)
Platelets: 178 10*3/uL (ref 150–400)
RBC: 4.31 MIL/uL (ref 3.87–5.11)
RBC: 3.76 MIL/uL — ABNORMAL LOW (ref 3.87–5.11)
RDW: 12.8 % (ref 11.5–15.5)
RDW: 12.7 % (ref 11.5–15.5)
WBC: 19.4 10*3/uL — ABNORMAL HIGH (ref 4.0–10.5)
WBC: 3.6 10*3/uL — ABNORMAL LOW (ref 4.0–10.5)
nRBC: 0 % (ref 0.0–0.2)
nRBC: 0 % (ref 0.0–0.2)

## 2021-01-19 LAB — COMPREHENSIVE METABOLIC PANEL
ALT: 19 U/L (ref 0–44)
AST: 33 U/L (ref 15–41)
Albumin: 4.1 g/dL (ref 3.5–5.0)
Alkaline Phosphatase: 63 U/L (ref 38–126)
Anion gap: 17 — ABNORMAL HIGH (ref 5–15)
BUN: 64 mg/dL — ABNORMAL HIGH (ref 8–23)
CO2: 25 mmol/L (ref 22–32)
Calcium: 10 mg/dL (ref 8.9–10.3)
Chloride: 96 mmol/L — ABNORMAL LOW (ref 98–111)
Creatinine, Ser: 2.52 mg/dL — ABNORMAL HIGH (ref 0.44–1.00)
GFR, Estimated: 17 mL/min — ABNORMAL LOW (ref >60)
Glucose, Bld: 150 mg/dL — ABNORMAL HIGH (ref 70–99)
Potassium: 3.8 mmol/L (ref 3.5–5.1)
Sodium: 138 mmol/L (ref 135–145)
Total Bilirubin: 1.1 mg/dL (ref 0.3–1.2)
Total Protein: 8.6 g/dL — ABNORMAL HIGH (ref 6.5–8.1)

## 2021-01-19 LAB — LIPASE, BLOOD: Lipase: 46 U/L (ref 11–51)

## 2021-01-19 LAB — BLOOD GAS, ARTERIAL
Acid-Base Excess: 1 mmol/L (ref 0.0–2.0)
Bicarbonate: 26 mmol/L (ref 20.0–28.0)
FIO2: 0.5
MECHVT: 430 mL
Mechanical Rate: 14
O2 Saturation: 99.5 %
PEEP: 5 cmH2O
Patient temperature: 37
RATE: 14 resp/min
pCO2 arterial: 42 mmHg (ref 32.0–48.0)
pH, Arterial: 7.4 (ref 7.350–7.450)
pO2, Arterial: 168 mmHg — ABNORMAL HIGH (ref 83.0–108.0)

## 2021-01-19 LAB — CBG MONITORING, ED: Glucose-Capillary: 125 mg/dL — ABNORMAL HIGH (ref 70–99)

## 2021-01-19 LAB — BASIC METABOLIC PANEL
Anion gap: 11 (ref 5–15)
BUN: 63 mg/dL — ABNORMAL HIGH (ref 8–23)
CO2: 25 mmol/L (ref 22–32)
Calcium: 8.3 mg/dL — ABNORMAL LOW (ref 8.9–10.3)
Chloride: 101 mmol/L (ref 98–111)
Creatinine, Ser: 2.45 mg/dL — ABNORMAL HIGH (ref 0.44–1.00)
GFR, Estimated: 18 mL/min — ABNORMAL LOW (ref >60)
Glucose, Bld: 155 mg/dL — ABNORMAL HIGH (ref 70–99)
Potassium: 3.6 mmol/L (ref 3.5–5.1)
Sodium: 137 mmol/L (ref 135–145)

## 2021-01-19 LAB — RESP PANEL BY RT-PCR (FLU A&B, COVID) ARPGX2
Influenza A by PCR: NEGATIVE
Influenza B by PCR: NEGATIVE
SARS Coronavirus 2 by RT PCR: NEGATIVE

## 2021-01-19 LAB — MAGNESIUM: Magnesium: 2.1 mg/dL (ref 1.7–2.4)

## 2021-01-19 LAB — LACTIC ACID, PLASMA
Lactic Acid, Venous: 2.7 mmol/L (ref 0.5–1.9)
Lactic Acid, Venous: 1 mmol/L (ref 0.5–1.9)

## 2021-01-19 LAB — PHOSPHORUS: Phosphorus: 5.4 mg/dL — ABNORMAL HIGH (ref 2.5–4.6)

## 2021-01-19 SURGERY — HERNIORRHAPHY, INGUINAL, ROBOT-ASSISTED, LAPAROSCOPIC
Anesthesia: General | Site: Abdomen | Laterality: Right

## 2021-01-19 MED ORDER — PHENYLEPHRINE HCL (PRESSORS) 10 MG/ML IV SOLN
INTRAVENOUS | Status: DC | PRN
  Administered 2021-01-19: 200 ug via INTRAVENOUS
  Administered 2021-01-19: 100 ug via INTRAVENOUS
  Administered 2021-01-19: 200 ug via INTRAVENOUS
  Administered 2021-01-19 (×2): 100 ug via INTRAVENOUS
  Administered 2021-01-19: 200 ug via INTRAVENOUS
  Administered 2021-01-19: 100 ug via INTRAVENOUS
  Administered 2021-01-19: 200 ug via INTRAVENOUS
  Administered 2021-01-19: 100 ug via INTRAVENOUS
  Administered 2021-01-19: 150 ug via INTRAVENOUS
  Administered 2021-01-19: 200 ug via INTRAVENOUS
  Administered 2021-01-19 (×8): 100 ug via INTRAVENOUS
  Administered 2021-01-19: 150 ug via INTRAVENOUS
  Administered 2021-01-19: 200 ug via INTRAVENOUS

## 2021-01-19 MED ORDER — SODIUM CHLORIDE 0.9 % IV SOLN
250.0000 mL | INTRAVENOUS | Status: DC
  Administered 2021-01-19 – 2021-01-24 (×3): 250 mL via INTRAVENOUS

## 2021-01-19 MED ORDER — ONDANSETRON HCL 4 MG/2ML IJ SOLN
INTRAMUSCULAR | Status: DC | PRN
  Administered 2021-01-19: 4 mg via INTRAVENOUS

## 2021-01-19 MED ORDER — DEXAMETHASONE SODIUM PHOSPHATE 10 MG/ML IJ SOLN
INTRAMUSCULAR | Status: AC
  Filled 2021-01-19: qty 1

## 2021-01-19 MED ORDER — DOCUSATE SODIUM 50 MG/5ML PO LIQD: 100.0000 mg | Freq: Two times a day (BID) | ORAL | Status: DC | PRN

## 2021-01-19 MED ORDER — PIPERACILLIN-TAZOBACTAM 3.375 G IVPB
INTRAVENOUS | Status: AC
  Filled 2021-01-19: qty 50

## 2021-01-19 MED ORDER — FENTANYL CITRATE (PF) 100 MCG/2ML IJ SOLN
INTRAMUSCULAR | Status: AC
  Filled 2021-01-19: qty 2

## 2021-01-19 MED ORDER — KETAMINE HCL 10 MG/ML IJ SOLN
INTRAMUSCULAR | Status: DC | PRN
  Administered 2021-01-19: 10 mg via INTRAVENOUS

## 2021-01-19 MED ORDER — ROCURONIUM BROMIDE 10 MG/ML (PF) SYRINGE
PREFILLED_SYRINGE | INTRAVENOUS | Status: AC
  Filled 2021-01-19: qty 10

## 2021-01-19 MED ORDER — PHENYLEPHRINE HCL (PRESSORS) 10 MG/ML IV SOLN
INTRAVENOUS | Status: AC
  Filled 2021-01-19: qty 1

## 2021-01-19 MED ORDER — MIDAZOLAM HCL 2 MG/2ML IJ SOLN: 1.0000 mg | INTRAMUSCULAR | Status: DC | PRN

## 2021-01-19 MED ORDER — 0.9 % SODIUM CHLORIDE (POUR BTL) OPTIME
TOPICAL | Status: DC | PRN
  Administered 2021-01-19: 500 mL
  Administered 2021-01-19: 4000 mL

## 2021-01-19 MED ORDER — DEXAMETHASONE SODIUM PHOSPHATE 10 MG/ML IJ SOLN
INTRAMUSCULAR | Status: DC | PRN
  Administered 2021-01-19: 4 mg via INTRAVENOUS

## 2021-01-19 MED ORDER — FENTANYL BOLUS VIA INFUSION
25.0000 ug | INTRAVENOUS | Status: DC | PRN
  Administered 2021-01-20 (×2): 50 ug via INTRAVENOUS
  Administered 2021-01-20: 25 ug via INTRAVENOUS
  Filled 2021-01-19: qty 100

## 2021-01-19 MED ORDER — INSULIN ASPART 100 UNIT/ML IJ SOLN: 0.0000 [IU] | INTRAMUSCULAR | Status: DC

## 2021-01-19 MED ORDER — ESMOLOL HCL 100 MG/10ML IV SOLN
INTRAVENOUS | Status: AC
  Filled 2021-01-19: qty 20

## 2021-01-19 MED ORDER — MORPHINE SULFATE (PF) 4 MG/ML IV SOLN
4.0000 mg | Freq: Once | INTRAVENOUS | Status: AC
  Administered 2021-01-19: 4 mg via INTRAVENOUS
  Filled 2021-01-19: qty 1

## 2021-01-19 MED ORDER — CEFAZOLIN SODIUM-DEXTROSE 2-4 GM/100ML-% IV SOLN
2.0000 g | Freq: Once | INTRAVENOUS | Status: AC
  Administered 2021-01-19: 2 g via INTRAVENOUS
  Filled 2021-01-19: qty 100

## 2021-01-19 MED ORDER — POLYETHYLENE GLYCOL 3350 17 G PO PACK: 17.0000 g | PACK | Freq: Every day | ORAL | Status: DC | PRN

## 2021-01-19 MED ORDER — PIPERACILLIN-TAZOBACTAM 3.375 G IVPB
3.3750 g | Freq: Two times a day (BID) | INTRAVENOUS | Status: DC
  Administered 2021-01-20 (×2): 3.375 g via INTRAVENOUS
  Filled 2021-01-19 (×2): qty 50

## 2021-01-19 MED ORDER — LIDOCAINE HCL (CARDIAC) PF 100 MG/5ML IV SOSY
PREFILLED_SYRINGE | INTRAVENOUS | Status: DC | PRN
  Administered 2021-01-19: 100 mg via INTRAVENOUS
  Administered 2021-01-19: 40 mg via INTRAVENOUS
  Administered 2021-01-19: 60 mg via INTRAVENOUS
  Administered 2021-01-19: 100 mg via INTRAVENOUS

## 2021-01-19 MED ORDER — SUCCINYLCHOLINE CHLORIDE 200 MG/10ML IV SOSY
PREFILLED_SYRINGE | INTRAVENOUS | Status: AC
  Filled 2021-01-19: qty 10

## 2021-01-19 MED ORDER — PROPOFOL 1000 MG/100ML IV EMUL: 0.0000 ug/kg/min | INTRAVENOUS | Status: DC

## 2021-01-19 MED ORDER — FAMOTIDINE 20 MG IN NS 100 ML IVPB
20.0000 mg | Freq: Two times a day (BID) | INTRAVENOUS | Status: DC
  Administered 2021-01-19 – 2021-01-20 (×2): 20 mg via INTRAVENOUS
  Filled 2021-01-19 (×2): qty 100

## 2021-01-19 MED ORDER — KETOROLAC TROMETHAMINE 30 MG/ML IJ SOLN
INTRAMUSCULAR | Status: AC
  Filled 2021-01-19: qty 1

## 2021-01-19 MED ORDER — LIDOCAINE HCL (PF) 2 % IJ SOLN
INTRAMUSCULAR | Status: AC
  Filled 2021-01-19: qty 5

## 2021-01-19 MED ORDER — BUPIVACAINE LIPOSOME 1.3 % IJ SUSP
INTRAMUSCULAR | Status: DC | PRN
  Administered 2021-01-19 (×2): 10 mL

## 2021-01-19 MED ORDER — FENTANYL CITRATE (PF) 100 MCG/2ML IJ SOLN
25.0000 ug | Freq: Once | INTRAMUSCULAR | Status: AC
  Administered 2021-01-19: 25 ug via INTRAVENOUS

## 2021-01-19 MED ORDER — PROPOFOL 500 MG/50ML IV EMUL
INTRAVENOUS | Status: AC
  Filled 2021-01-19: qty 50

## 2021-01-19 MED ORDER — LACTATED RINGERS IV SOLN: INTRAVENOUS | Status: DC | PRN

## 2021-01-19 MED ORDER — METOPROLOL TARTRATE 5 MG/5ML IV SOLN
INTRAVENOUS | Status: DC | PRN
  Administered 2021-01-19: 2 mg via INTRAVENOUS

## 2021-01-19 MED ORDER — FENTANYL 2500MCG IN NS 250ML (10MCG/ML) PREMIX INFUSION
25.0000 ug/h | INTRAVENOUS | Status: DC
  Administered 2021-01-19: 50 ug/h via INTRAVENOUS
  Filled 2021-01-19: qty 250

## 2021-01-19 MED ORDER — PIPERACILLIN-TAZOBACTAM 3.375 G IVPB 30 MIN: 3.3750 g | Freq: Three times a day (TID) | INTRAVENOUS | Status: DC

## 2021-01-19 MED ORDER — ACETAMINOPHEN 10 MG/ML IV SOLN
INTRAVENOUS | Status: AC
  Filled 2021-01-19: qty 100

## 2021-01-19 MED ORDER — PHENYLEPHRINE HCL-NACL 20-0.9 MG/250ML-% IV SOLN
25.0000 ug/min | INTRAVENOUS | Status: DC
  Administered 2021-01-19: 25 ug/min via INTRAVENOUS
  Administered 2021-01-20: 40 ug/min via INTRAVENOUS
  Administered 2021-01-20: 45 ug/min via INTRAVENOUS
  Administered 2021-01-21: 25 ug/min via INTRAVENOUS
  Filled 2021-01-19 (×4): qty 250

## 2021-01-19 MED ORDER — PROPOFOL 10 MG/ML IV BOLUS
INTRAVENOUS | Status: AC
  Filled 2021-01-19: qty 20

## 2021-01-19 MED ORDER — CEFAZOLIN SODIUM-DEXTROSE 2-4 GM/100ML-% IV SOLN
INTRAVENOUS | Status: AC
  Filled 2021-01-19: qty 100

## 2021-01-19 MED ORDER — ROCURONIUM BROMIDE 100 MG/10ML IV SOLN
INTRAVENOUS | Status: DC | PRN
  Administered 2021-01-19: 5 mg via INTRAVENOUS
  Administered 2021-01-19: 10 mg via INTRAVENOUS
  Administered 2021-01-19: 20 mg via INTRAVENOUS
  Administered 2021-01-19: 10 mg via INTRAVENOUS

## 2021-01-19 MED ORDER — BISACODYL 10 MG RE SUPP
10.0000 mg | Freq: Once | RECTAL | Status: AC
  Administered 2021-01-20: 10 mg via RECTAL
  Filled 2021-01-19: qty 1

## 2021-01-19 MED ORDER — ESMOLOL HCL 100 MG/10ML IV SOLN
INTRAVENOUS | Status: DC | PRN
  Administered 2021-01-19 (×3): 30 mg via INTRAVENOUS
  Administered 2021-01-19 (×2): 40 mg via INTRAVENOUS

## 2021-01-19 MED ORDER — PIPERACILLIN-TAZOBACTAM 3.375 G IVPB 30 MIN
3.3750 g | Freq: Two times a day (BID) | INTRAVENOUS | Status: DC
  Filled 2021-01-19 (×4): qty 50

## 2021-01-19 MED ORDER — ONDANSETRON HCL 4 MG/2ML IJ SOLN
INTRAMUSCULAR | Status: AC
  Filled 2021-01-19: qty 2

## 2021-01-19 MED ORDER — FENTANYL CITRATE (PF) 100 MCG/2ML IJ SOLN
INTRAMUSCULAR | Status: DC | PRN
  Administered 2021-01-19 (×4): 25 ug via INTRAVENOUS

## 2021-01-19 MED ORDER — KETAMINE HCL 50 MG/5ML IJ SOSY
PREFILLED_SYRINGE | INTRAMUSCULAR | Status: AC
  Filled 2021-01-19: qty 5

## 2021-01-19 MED ORDER — ONDANSETRON HCL 4 MG/2ML IJ SOLN
4.0000 mg | Freq: Once | INTRAMUSCULAR | Status: AC
  Administered 2021-01-19: 4 mg via INTRAVENOUS
  Filled 2021-01-19: qty 2

## 2021-01-19 MED ORDER — PROPOFOL 10 MG/ML IV BOLUS
INTRAVENOUS | Status: DC | PRN
  Administered 2021-01-19: 65 mg via INTRAVENOUS
  Administered 2021-01-19: 15 mg via INTRAVENOUS

## 2021-01-19 MED ORDER — LACTATED RINGERS IV BOLUS
1000.0000 mL | Freq: Once | INTRAVENOUS | Status: AC
  Administered 2021-01-19: 1000 mL via INTRAVENOUS

## 2021-01-19 MED ORDER — BUPIVACAINE-EPINEPHRINE (PF) 0.25% -1:200000 IJ SOLN
INTRAMUSCULAR | Status: DC | PRN
  Administered 2021-01-19: 10 mL via PERINEURAL

## 2021-01-19 MED ORDER — ACETAMINOPHEN 10 MG/ML IV SOLN
INTRAVENOUS | Status: DC | PRN
  Administered 2021-01-19: 1000 mg via INTRAVENOUS

## 2021-01-19 MED ORDER — SUCCINYLCHOLINE CHLORIDE 200 MG/10ML IV SOSY
PREFILLED_SYRINGE | INTRAVENOUS | Status: DC | PRN
  Administered 2021-01-19: 120 mg via INTRAVENOUS

## 2021-01-19 MED ORDER — METOPROLOL TARTRATE 5 MG/5ML IV SOLN
INTRAVENOUS | Status: AC
  Filled 2021-01-19: qty 5

## 2021-01-19 SURGICAL SUPPLY — 66 items
BLADE CLIPPER SURG (BLADE) ×4 IMPLANT
BLADE SURG 15 STRL LF DISP TIS (BLADE) ×3 IMPLANT
BLADE SURG 15 STRL SS (BLADE) ×1
BULB RESERV EVAC DRAIN JP 100C (MISCELLANEOUS) ×4 IMPLANT
CANNULA CAP OBTURATR AIRSEAL 8 (CAP) ×4 IMPLANT
CHLORAPREP W/TINT 26 (MISCELLANEOUS) ×4 IMPLANT
COVER TIP SHEARS 8 DVNC (MISCELLANEOUS) ×3 IMPLANT
COVER TIP SHEARS 8MM DA VINCI (MISCELLANEOUS) ×1
COVER WAND RF STERILE (DRAPES) ×4 IMPLANT
DEFOGGER SCOPE WARMER CLEARIFY (MISCELLANEOUS) ×4 IMPLANT
DERMABOND ADVANCED (GAUZE/BANDAGES/DRESSINGS) ×1
DERMABOND ADVANCED .7 DNX12 (GAUZE/BANDAGES/DRESSINGS) ×3 IMPLANT
DRAIN CHANNEL JP 19F (MISCELLANEOUS) ×4 IMPLANT
DRAPE 3/4 80X56 (DRAPES) ×8 IMPLANT
DRAPE ARM DVNC X/XI (DISPOSABLE) ×9 IMPLANT
DRAPE COLUMN DVNC XI (DISPOSABLE) ×3 IMPLANT
DRAPE DA VINCI XI ARM (DISPOSABLE) ×3
DRAPE DA VINCI XI COLUMN (DISPOSABLE) ×1
DRSG OPSITE POSTOP 3X4 (GAUZE/BANDAGES/DRESSINGS) ×12 IMPLANT
DRSG OPSITE POSTOP 4X8 (GAUZE/BANDAGES/DRESSINGS) ×4 IMPLANT
DRSG TEGADERM 4X4.75 (GAUZE/BANDAGES/DRESSINGS) ×4 IMPLANT
ELECT REM PT RETURN 9FT ADLT (ELECTROSURGICAL) ×4
ELECTRODE REM PT RTRN 9FT ADLT (ELECTROSURGICAL) ×3 IMPLANT
GAUZE 4X4 16PLY ~~LOC~~+RFID DBL (SPONGE) ×4 IMPLANT
GLOVE SURG ORTHO LTX SZ7.5 (GLOVE) ×12 IMPLANT
GOWN STRL REUS W/ TWL LRG LVL3 (GOWN DISPOSABLE) ×9 IMPLANT
GOWN STRL REUS W/TWL LRG LVL3 (GOWN DISPOSABLE) ×3
GRADUATE 1200CC STRL 31836 (MISCELLANEOUS) ×4 IMPLANT
GRASPER SUT TROCAR 14GX15 (MISCELLANEOUS) IMPLANT
HANDLE YANKAUER SUCT BULB TIP (MISCELLANEOUS) ×4 IMPLANT
IRRIGATION STRYKERFLOW (MISCELLANEOUS) IMPLANT
IRRIGATOR STRYKERFLOW (MISCELLANEOUS)
IV CATH ANGIO 14GX1.88 NO SAFE (IV SOLUTION) ×4 IMPLANT
KIT PINK PAD W/HEAD ARE REST (MISCELLANEOUS) ×4 IMPLANT
KIT PINK PAD W/HEAD ARM REST (MISCELLANEOUS) ×3 IMPLANT
LABEL OR SOLS (LABEL) ×4 IMPLANT
LIGASURE VESSEL 5MM BLUNT TIP (ELECTROSURGICAL) ×4 IMPLANT
MANIFOLD NEPTUNE II (INSTRUMENTS) ×4 IMPLANT
NEEDLE HYPO 22GX1.5 SAFETY (NEEDLE) ×4 IMPLANT
NEEDLE INSUFFLATION 14GA 120MM (NEEDLE) IMPLANT
NS IRRIG 1000ML POUR BTL (IV SOLUTION) ×16 IMPLANT
NS IRRIG 500ML POUR BTL (IV SOLUTION) ×4 IMPLANT
PACK LAP CHOLECYSTECTOMY (MISCELLANEOUS) ×4 IMPLANT
RELOAD LINEAR CUT PROX 55 BLUE (ENDOMECHANICALS) ×8 IMPLANT
RELOAD PROXIMATE TA60MM BLUE (ENDOMECHANICALS) ×4 IMPLANT
SEAL CANN UNIV 5-8 DVNC XI (MISCELLANEOUS) ×6 IMPLANT
SEAL XI 5MM-8MM UNIVERSAL (MISCELLANEOUS) ×2
SET TUBE FILTERED XL AIRSEAL (SET/KITS/TRAYS/PACK) ×4 IMPLANT
SOLUTION ELECTROLUBE (MISCELLANEOUS) ×4 IMPLANT
SPONGE DRAIN TRACH 4X4 STRL 2S (GAUZE/BANDAGES/DRESSINGS) ×4 IMPLANT
SPONGE T-LAP 18X18 ~~LOC~~+RFID (SPONGE) ×8 IMPLANT
STAPLER GUN LINEAR PROX 60 (STAPLE) ×4 IMPLANT
STAPLER PROXIMATE 55 BLUE (STAPLE) ×4 IMPLANT
STAPLER SKIN PROX 35W (STAPLE) ×4 IMPLANT
SUT ETHIBOND 0 MO6 C/R (SUTURE) ×4 IMPLANT
SUT MNCRL 4-0 (SUTURE) ×1
SUT MNCRL 4-0 27XMFL (SUTURE) ×3
SUT PDS AB 1 CT1 27 (SUTURE) ×8 IMPLANT
SUT SILK 3-0 (SUTURE) ×4 IMPLANT
SUT VIC AB 0 CT2 27 (SUTURE) ×4 IMPLANT
SUT VLOC 90 2/L VL 12 GS22 (SUTURE) IMPLANT
SUT VLOC 90 S/L VL9 GS22 (SUTURE) ×4 IMPLANT
SUTURE MNCRL 4-0 27XMF (SUTURE) ×3 IMPLANT
TRAY FOLEY MTR SLVR 16FR STAT (SET/KITS/TRAYS/PACK) ×4 IMPLANT
TROCAR Z-THREAD FIOS 11X100 BL (TROCAR) IMPLANT
WATER STERILE IRR 500ML POUR (IV SOLUTION) ×4 IMPLANT

## 2021-01-19 NOTE — ED Notes (Signed)
Pt transported to OR with all belongings and family aware

## 2021-01-19 NOTE — ED Notes (Signed)
Lab called and is sending a phlebotomist

## 2021-01-19 NOTE — ED Provider Notes (Signed)
[facility] Emergency Department Provider Note  ____________________________________________  Time seen: Approximately 4:38 PM  I have reviewed the triage vital signs and the nursing notes.   HISTORY  Chief Complaint Abdominal Pain    HPI Vanessa Lang is a 87 y.o. female with a history of bowel obstruction CHF hypertension who comes ED complaining of severe right lower quadrant pain that started 2 days ago after leaving the emergency department.  Its been constant, worse with movement, no alleviating factors, nonradiating.  Last bowel movement was 1 week ago.  She was having vomiting today as well.  She was in the emergency department 2 days ago, found to have a inguinal hernia which was reduced in the emergency department and she got much better.    Past Medical History:  Diagnosis Date   Anginal pain (HCC) 09/06/2019   last episode 2 months ago   Arthritis    Bowel obstruction (HCC)    CHF (congestive heart failure) (HCC)    Depression    Diverticulitis    Dyspnea    GERD (gastroesophageal reflux disease)    Gout    Gout    Heart murmur    Heart valve disorder    HLD (hyperlipidemia)    HTN (hypertension)    Hypothyroidism    Neuropathy    Presence of permanent cardiac pacemaker 1/26/201   [employer] [id number]   Wears dentures    partial upper, full lower     Patient Active Problem List   Diagnosis Date Noted   SBO (small bowel obstruction) (HCC) 2021/02/08   Incarcerated right inguinal hernia    Malnutrition of moderate degree 07/01/2018   GI bleed 05/26/2015   HTN (hypertension) 05/26/2015   Hypothyroid 05/26/2015   Chronic diastolic CHF (congestive heart failure) (HCC) 05/26/2015   AKI (acute kidney injury) (HCC) 05/26/2015     Past Surgical History:  Procedure Laterality Date   ABDOMINAL HYSTERECTOMY     ABDOMINAL SURGERY     CATARACT EXTRACTION W/PHACO Left 09/15/2019   Procedure: CATARACT EXTRACTION PHACO AND INTRAOCULAR  LENS PLACEMENT (IOC) LEFT;  Surgeon: Galen Manila, MD;  Location: [facility] ORS;  Service: Ophthalmology;  Laterality: Left;  Lot # [id number] H [place]: 00:38.8 CDE: 6.81   CATARACT EXTRACTION W/PHACO Right 10/13/2019   Procedure: CATARACT EXTRACTION PHACO AND INTRAOCULAR LENS PLACEMENT (IOC) RIGHT;  Surgeon: Galen Manila, MD;  Location: [facility] ORS;  Service: Ophthalmology;  Laterality: Right;  US00:35.5 CDE5.35 [id number] H   COLON SURGERY     colon resection (for blockage)  with hernia repair   HERNIA REPAIR     and colon resection   IR ANGIOGRAM VISCERAL SELECTIVE  06/28/2018   PACEMAKER INSERTION     PPM GENERATOR CHANGEOUT N/A 07/04/2020   Procedure: PPM GENERATOR CHANGEOUT;  Surgeon: Marcina Millard, MD;  Location: [facility] INVASIVE [facility];  Service: Cardiovascular;  Laterality: N/A;   THYROIDECTOMY       Prior to Admission medications   Medication Sig Start Date End Date Taking? Authorizing Provider  ALPRAZolam Prudy Feeler) 0.25 MG tablet Take 0.25 mg by mouth daily as needed for anxiety.    [provider]  amLODipine (NORVASC) 5 MG tablet Take 5 mg by mouth daily at 12 noon. 05/02/15   [provider]  aspirin EC 81 MG tablet Take 81 mg by mouth daily at 12 noon.    [provider]  atenolol (TENORMIN) 50 MG tablet Take 50 mg by mouth daily at 12 noon. 03/22/15   [provider]  escitalopram (LEXAPRO) 5 MG tablet Take 5 mg by mouth daily at 12 noon. 06/04/18 09/01/20  [provider]  ferrous sulfate 325 (65 FE) MG tablet Take 1 tablet (325 mg total) by mouth 2 (two) times daily with a meal. 05/29/15   [name], MD  gabapentin (NEURONTIN) 100 MG capsule Take 100 mg by mouth 3 (three) times daily.  03/22/15   [provider]  hydrochlorothiazide (HYDRODIURIL) 25 MG tablet Take 25 mg by mouth daily at 12 noon. 10/24/15   [provider]  isosorbide mononitrate (IMDUR) 30 MG 24 hr tablet Take 30 mg by mouth daily.  05/11/15    [provider]  lisinopril (PRINIVIL,ZESTRIL) 40 MG tablet Take 40 mg by mouth daily at 12 noon. 05/11/15   [provider]  meloxicam (MOBIC) 7.5 MG tablet Take 7.5 mg by mouth daily as needed for pain.     [provider]  Multiple Vitamin (MULTIVITAMIN WITH MINERALS) TABS tablet Take 1 tablet by mouth daily.    [provider]  pantoprazole (PROTONIX) 40 MG tablet Take 40 mg by mouth 2 (two) times daily.  04/21/18   [provider]  senna (SENOKOT) 8.6 MG tablet Take 1 tablet by mouth daily as needed for constipation.    [provider]  SYNTHROID 75 MCG tablet Take 75 mcg by mouth daily at 6 (six) AM. (0500) 05/22/15   [provider]     Allergies Propranolol and Shellfish allergy   Family History  Problem Relation Age of Onset   Thyroid disease Mother    Hypertension Sister    Thyroid disease Sister    Heart attack Brother    Cancer Brother    Colon cancer Sister     Social History Social History   Tobacco Use   Smoking status: Former    Types: Cigarettes    Quit date: 1988    Years since quitting: 34.7   Smokeless tobacco: Never  Vaping Use   Vaping Use: Never used  Substance Use Topics   Alcohol use: No    Alcohol/week: 0.0 standard drinks   Drug use: No    Review of Systems  Constitutional:   No fever or chills.  ENT:   No sore throat. No rhinorrhea. Cardiovascular:   No chest pain or syncope. Respiratory:   No dyspnea or cough. Gastrointestinal:   Positive as above for abdominal pain and vomiting Musculoskeletal:   Negative for focal pain or swelling All other systems reviewed and are negative except as documented above in ROS and HPI.  ____________________________________________   PHYSICAL EXAM:  VITAL SIGNS: ED Triage Vitals  Enc Vitals Group     BP 01/05/2021 1159 116/85     Pulse Rate 01/05/2021 1159 72     Resp 01/16/2021 1159 18     Temp 01/29/2021 1159 97.8 F (36.6 C)     Temp  Source 01/17/2021 1159 Oral     SpO2 01/29/2021 1159 98 %     Weight 01/05/2021 1159 143 lb 4.8 oz (65 kg)     Height 01/10/2021 1531 5\' 10"  (1.778 m)     Head Circumference --      Peak Flow --      Pain Score 01/23/2021 1621 Asleep     Pain Loc --      Pain Edu? --      Excl. in GC? --     Vital signs reviewed, nursing assessments reviewed.   Constitutional:  Alert and oriented. Non-toxic appearance. Eyes:   Conjunctivae are normal. EOMI. PERRL. ENT      Head:   Normocephalic and atraumatic.      Nose:   Wearing a mask.      Mouth/Throat:   Wearing a mask.      Neck:   No meningismus. Full ROM. Hematological/Lymphatic/Immunilogical:   No cervical lymphadenopathy. Cardiovascular:   RRR. Symmetric bilateral radial and DP pulses.  No murmurs. Cap refill less than 2 seconds. Respiratory:   Normal respiratory effort without tachypnea/retractions. Breath sounds are clear and equal bilaterally. No wheezes/rales/rhonchi. Gastrointestinal:   Soft with large right inguinal hernia which is tender to the touch, firm, nonreducible.  Non distended.   No rebound, rigidity, or guarding.  Musculoskeletal:   Normal range of motion in all extremities. No joint effusions.  No lower extremity tenderness.  No edema. Neurologic:   Normal speech and language.  Motor grossly intact. No acute focal neurologic deficits are appreciated.  Skin:    Skin is warm, dry and intact. No rash noted.  No petechiae, purpura, or bullae.  ____________________________________________    LABS (pertinent positives/negatives) (all labs ordered are listed, but only abnormal results are displayed) Labs Reviewed  CBC - Abnormal; Notable for the following components:      Result Value   WBC 19.4 (*)    All other components within normal limits  COMPREHENSIVE METABOLIC PANEL - Abnormal; Notable for the following components:   Chloride 96 (*)    Glucose, Bld 150 (*)    BUN 64 (*)    Creatinine, Ser 2.52 (*)    Total Protein  8.6 (*)    GFR, Estimated 17 (*)    Anion gap 17 (*)    All other components within normal limits  LACTIC ACID, PLASMA - Abnormal; Notable for the following components:   Lactic Acid, Venous 2.7 (*)    All other components within normal limits  RESP PANEL BY RT-PCR (FLU A&B, COVID) ARPGX2  LIPASE, BLOOD  URINALYSIS, COMPLETE (UACMP) WITH MICROSCOPIC   ____________________________________________   EKG Interpreted by me Sinus rhythm rate of 73, left axis, normal intervals.  LVH with repolarization abnormality.  Normal ST segments.  No acute ischemic changes.   ____________________________________________    RADIOLOGY  No results found.  ____________________________________________   PROCEDURES Procedures  ____________________________________________  DIFFERENTIAL DIAGNOSIS   Strangulated hernia, bowel obstruction, bowel ischemia, constipation  CLINICAL IMPRESSION / ASSESSMENT AND PLAN / ED COURSE  Medications ordered in the ED: Medications  ceFAZolin (ANCEF) IVPB 2g/100 mL premix ( Intravenous MAR Hold 01/28/2021 1809)  ceFAZolin (ANCEF) 2-4 GM/100ML-% IVPB (has no administration in time range)  0.9 % irrigation (POUR BTL) (500 mLs Irrigation Given 01/27/2021 1811)  lactated ringers bolus 1,000 mL (1,000 mLs Intravenous New Bag/Given 01/14/2021 1549)  morphine 4 MG/ML injection 4 mg (4 mg Intravenous Given 01/29/2021 1549)  ondansetron (ZOFRAN) injection 4 mg (4 mg Intravenous Given 01/18/2021 1547)    Pertinent labs & imaging results that were available during my care of the patient were reviewed by me and considered in my medical decision making (see chart for details).  Kerrington Sova was evaluated in Emergency Department on 01/24/2021 for the symptoms described in the history of present illness. She was evaluated in the context of the global COVID-19 pandemic, which necessitated consideration that the patient might be at risk for infection with the SARS-CoV-2 virus that causes  COVID-19. Institutional protocols and algorithms that pertain to the evaluation of  patients at risk for COVID-19 are in a state of rapid change based on information released by regulatory bodies including the CDC and federal and state organizations. These policies and algorithms were followed during the patient's care in the ED.   Patient presents with large right inguinal hernia but is tender and firm concerning for incarcerated hernia.  She is having vomiting.  I was able to reduce this myself at bedside, discussed with surgery who came to evaluate.  Clinical Course as of Feb 05, 2021 1836  Sat 02/05/21  1638 At bedside with Dr. Claudine Mouton, hernia still not reducible.  He'll plan to proceed with surgical exploration/repair.  [PS]    Clinical Course User Index [PS] Sharman Cheek, MD     ____________________________________________   FINAL CLINICAL IMPRESSION(S) / ED DIAGNOSES    Final diagnoses:  Incarcerated right inguinal hernia     ED Discharge Orders     None       Portions of this note were generated with dragon dictation software. Dictation errors may occur despite best attempts at proofreading.    Sharman Cheek, MD 2021-02-05 1836

## 2021-01-19 NOTE — Anesthesia Procedure Notes (Signed)
Procedure Name: Intubation Date/Time: 02/16/21 6:14 PM Performed by: Estanislado Emms, CRNA Pre-anesthesia Checklist: Patient identified, Patient being monitored, Timeout performed, Emergency Drugs available and Suction available Patient Re-evaluated:Patient Re-evaluated prior to induction Oxygen Delivery Method: Circle system utilized Preoxygenation: Pre-oxygenation with 100% oxygen Induction Type: IV induction Laryngoscope Size: Miller and 2 Grade View: Grade I Tube type: Oral Tube size: 7.0 mm Number of attempts: 1 Airway Equipment and Method: Stylet Placement Confirmation: ETT inserted through vocal cords under direct vision, positive ETCO2 and breath sounds checked- equal and bilateral Secured at: 21 cm Tube secured with: Tape Dental Injury: Teeth and Oropharynx as per pre-operative assessment

## 2021-01-19 NOTE — Anesthesia Preprocedure Evaluation (Addendum)
Anesthesia Evaluation  Patient identified by MRN, date of birth, ID band Patient awake    Airway Mallampati: III       Dental   Pulmonary shortness of breath and with exertion, former smoker,     + decreased breath sounds      Cardiovascular Exercise Tolerance: Poor hypertension, + angina + CAD and +CHF  + pacemaker + Valvular Problems/Murmurs      Neuro/Psych PSYCHIATRIC DISORDERS Depression negative neurological ROS     GI/Hepatic GERD  ,  Endo/Other  Hypothyroidism   Renal/GU ARF and Renal InsufficiencyRenal disease     Musculoskeletal  (+) Arthritis ,   Abdominal   Peds  Hematology   Anesthesia Other Findings 87y.o. female with known chronic kidney disease stage III symptomatic bradycardia and heart block status post dual-chamber pacemaker placement and  Severe aortic valve stenosis with normal LV systolic function with ejection fraction of 50%   ARI cr 2.5  acidosis  Reproductive/Obstetrics                           Anesthesia Physical Anesthesia Plan  ASA: 4 and emergent  Anesthesia Plan: General/Spinal   Post-op Pain Management:    Induction:   PONV Risk Score and Plan: 1  Airway Management Planned: Oral ETT  Additional Equipment:   Intra-op Plan:   Post-operative Plan:   Informed Consent: I have reviewed the patients History and Physical, chart, labs and discussed the procedure including the risks, benefits and alternatives for the proposed anesthesia with the patient or authorized representative who has indicated his/her understanding and acceptance.       Plan Discussed with: CRNA and Surgeon  Anesthesia Plan Comments: (Spoke with surgeon regarding robotic surgery and her diagnosis of severe AS. I don't believe she can withstand a long surgery with or without high abdominal pressures. He was in line with expediting the process if the patient is responding poorly. We  will proceed with caution as this is an emergency; however, there was discussion about post op ICU, possible intubation, possible A line, possible central line. )        Anesthesia Quick Evaluation

## 2021-01-19 NOTE — Anesthesia Procedure Notes (Addendum)
Arterial Line Insertion Start/End25-Sep-2022 5:45 PM, 27-Jan-2021 5:50 PM Performed by: Gwendolyn Fill, MD, anesthesiologist  Preanesthetic checklist: patient identified, IV checked, site marked, risks and benefits discussed, surgical consent, monitors and equipment checked, pre-op evaluation, timeout performed and anesthesia consent Lidocaine 1% used for infiltration Left, radial was placed Catheter size: 22 G Hand hygiene performed  and Seldinger technique used Allen's test indicative of satisfactory collateral circulation Attempts: 1 Procedure performed without using ultrasound guided technique. Following insertion, dressing applied. Post procedure assessment: normal  Patient tolerated the procedure well with no immediate complications.

## 2021-01-19 NOTE — Consult Note (Signed)
NAME:  Vanessa Lang, MRN:  [id number], DOB:  14-Aug-1922, LOS: 0 ADMISSION DATE:  2021/01/28, CONSULTATION DATE:  01-28-21 REFERRING MD:  Dr. Claudine Mouton, CHIEF COMPLAINT:   Abdominal pain  History of Present Illness:  87 year old female presenting at [facility] ED on 01-28-21 with complaints of severe right lower quadrant pain for the last 2 days.  Of note she had a recent ED visit 2 days ago for a right inguinal hernia that was successfully reduced in the ED which improved her symptoms at that time and she was discharged home.   Per documentation patient reported upon arrival that her symptoms immediately reoccurred once she arrived home along with associated progressive nausea, biliary emesis & right groin pain.  Patient has past medical history including an emergency right inguinal hernia repair in 2015, requiring a small bowel resection as well.  Patient reported to EDP that her last bowel movement was 1 week ago. ED course: Initial vitals: Afebrile 97.8, RR 18, NSR at 72, BP 116/85 & SPO2 98% on room air Significant labs: Chloride 96, AKI with BUN/Cr: 64/2.52, anion gap slightly elevated at 17, serum CO2 normal at 25, glucose 150-rest of chemistry panel WNL.  Leukocytosis at 19.4, hemoglobin stable at 13.2, platelets stable at 220.  COVID-19 & influenza a/B: Negative, lactic acidosis 2.7.   Previous CT abdomen pelvis with contrast from 01/17/2021 >> large right groin hernia containing small bowel loops.  Dilated small bowel in the left abdomen tracks down to the hernia were markedly distended fluid-filled loops of small bowel is identified with additional decompressed small bowel loops tracking back into the peritoneal cavity.  Colon is nondilated imaging features compatible with small bowel obstruction secondary to incarcerated right groin hernia.  Multiple bilateral simple and complex renal cysts containing varying degrees of proteinaceous debris/hemorrhage.  Small volume free fluid in the pelvis,  diffuse dilatation of the main pancreatic duct similar to prior.  Per EDP documentation patient presented with large right inguinal hernia and vomiting.  EDP reduced bedside and discussed with Dr. Claudine Mouton from surgery who came bedside to evaluate.  After bedside assessment decision was made to proceed with surgical exploration and repair.  At that time patient denied fevers chills dyspnea or chest pain, and reported no flatus or bowel movement.  Patient taken urgently to OR for diagnostic laparoscopy and open repair of recurrent incarcerated right inguinal hernia, laparotomy with resection of small bowel x 2.  The patient was intubated requiring mechanical ventilation for the procedure.  During the procedure patient had 3.5 L of NG tube output of dark bilious fluid.  The OR team also reported the patient had 2 runs of SVT which were treated with esmolol and lidocaine during the procedure.  Overall the patient remained hemodynamically stable, and was transferred to ICU still requiring mechanical ventilation.  PCCM consulted for assistance in ventilator management and monitoring.  Pertinent  Medical History  Hypertension HFpEF Severe aortic stenosis Presence of permanent pacemaker Diverticulitis with GIB Gout Hyperlipidemia Hypothyroidism Inguinal hernia s/p repair and small bowel resection - 2015 Former smoker- quit 1988 Bradycardia CKD Stage 3b Anxiety/Depression  Significant Hospital Events: Including procedures, antibiotic start and stop dates in addition to other pertinent events   28-Jan-2021 patient taken urgently to the OR for incarcerated right inguinal hernia repair and small bowel resection x 2, transferred to ICU requiring mechanical ventilation support  Interim History / Subjective:  Patient intubated and sedated, appears comfortable on propofol drip from OR.  Some small sanguinous drainage  from midline incision, otherwise multiple honeycomb sites clean dry and intact.  RLQ JP  drain with sanguinous fluid. Dr. Claudine Mouton reported leaving a voicemail message for the patient's sister, also stated there was a POA named Annette Stable who he was also unable to reach this evening.  Objective   Blood pressure 131/78, pulse 60, temperature 97.8 F (36.6 C), temperature source Oral, resp. rate 18, height 5\' 10"  (1.778 m), weight 65 kg, SpO2 92 %.        Intake/Output Summary (Last 24 hours) at 05-Feb-2021 2250 Last data filed at 02-05-21 2246 Gross per 24 hour  Intake 3700 ml  Output 3475 ml  Net 225 ml   Filed Weights   02-05-2021 1159 05-Feb-2021 1531  Weight: 65 kg 65 kg    Examination: General: Adult female, critically ill, lying in bed intubated & sedated requiring mechanical ventilation,  NAD HEENT: MM pink/moist, anicteric, atraumatic, neck supple Neuro: Sedated, unable to follow commands, PERRL +3 CV: s1s2 RRR,  Atrial paced at 60-on monitor, no r/m/g Pulm: Regular, non labored on PRVC at 50% & PEEP 5, breath sounds clear-BUL & diminished-BLL GI: soft, with multiple sites with honeycomb dressings including a midline incision (started laparoscopically- switched to open repair), JP drain in place, bs x 4 GU: foley in place with clear yellow urine Skin: limited exam- no rashes/lesions noted Extremities: warm/dry, pulses + 2 R/P, trace edema noted  Resolved Hospital Problem list     Assessment & Plan:  Incarcerated right inguinal hernia status post repair and small bowel resection x 2 PMHx: inguinal hernia s/p repair & small bowel resection 2015 Patient received cefazolin x 1 and 1 L LR bolus (in handoff also reported a dose of zosyn was given & additional 2 L of LR) - initiate zosyn, Dr. 2016 from surgery in agreement - OGT to ILWS, NPO until cleared by surgery - SCD's for VTE prophylaxis - STAT labs, significant oozing reported during the procedure- this combined with SVT will f/u BMP/Mg/Phos/CBC/ lactic  - daily CBC, monitor WBC/ fever curve  Acute  Respiratory Failure in the setting of Incarcerated Right inguinal hernia repair and small bowel resection  - Ventilator settings: PRVC  8 mL/kg, 50% FiO2, 5 PEEP, continue ventilator support & lung protective strategies - Wean PEEP & FiO2 as tolerated, maintain SpO2 > 90% - Head of bed elevated 30 degrees, VAP protocol in place - Plateau pressures less than 30 cm H20  - Intermittent chest x-ray & ABG PRN - Daily WUA with SBT as tolerated  - Ensure adequate pulmonary hygiene  - Bronchodilators PRN - PAD protocol in place: continue Fentanyl drip & Propofol drip - Neo-synephrine drip ordered for hypotension s/t sedation, wean to maintain MAP > 65  Acute Kidney Injury on CKD Stage 3b secondary to suspected hypovolemia & recent contrast exposure in the setting of incarcerated right inguinal hernia Cr. On 01/17/21 was within baseline level of 1.24, she received a CT with contrast and at this admission Cr had jumped to 2.52. Patient also reported nausea/vomiting over last 2 days, suspected dehydration. Recent CT abdomen/pelvis showed known simplex & complex cysts containing varying degrees of proteinaceous debris/hemorrhage in both kidneys without the presence of a suspicious mass lesion or hydroureter. - Strict I/O's: alert provider if UOP < 0.5 mL/kg/hr - gentle IVF hydration, patient received IVF resuscitation during procedure will also start some continuous IVF overnight - Daily BMP, replace electrolytes PRN - Avoid nephrotoxic agents as able, ensure adequate renal perfusion - Consult nephrology  if iHD or CRRT indicated  - consider f/u renal US  Sustained SVT in the setting of incarcerated inguinal hernia repair & small bowel resection PMHx: PPM, bradycardia, severe AS, HFpEF, HTN SVT was treated during the procedure with esmolol, lidocaine & metoprolol IV. CRNA reported the metoprolol 2 mg seemed to be the most effective. - f/u STAT BMP, replace electrolytes PRN - f/u TSH & thyroid panel -  continuous cardiac monitoring - metoprolol IV PRN for SVT/HR sustained > 130 - patient requiring peripheral vasopressor support due to sedative administration, utilizing neo-synephrine to minimize tachycardia - outpatient medication on hold until patient stabilizes & PO access available: amlodipine, atenolol, Imdur, hydrochlorothiazide, lisinopril  Hypothyroidism - f/u Thyroid panel, as above - restart home Synthroid as patient stabilizes and PO access is available  Best Practice (right click and "Reselect all SmartList Selections" daily)  Diet/type: NPO DVT prophylaxis: SCD GI prophylaxis: H2B Lines: Arterial Line and yes and it is still needed Foley:  Yes, and it is still needed Code Status:  full code Last date of multidisciplinary goals of care discussion [per primary service- 02-02-21]  Labs   CBC: Recent Labs  Lab 01/17/21 0816 February 02, 2021 1422  WBC 13.6* 19.4*  HGB 12.9 13.2  HCT 38.4 40.9  MCV 96.2 94.9  PLT 192 220    Basic Metabolic Panel: Recent Labs  Lab 01/17/21 0816 02-02-21 1422  NA 136 138  K 3.8 3.8  CL 99 96*  CO2 27 25  GLUCOSE 142* 150*  BUN 28* 64*  CREATININE 1.24* 2.52*  CALCIUM 9.8 10.0   GFR: Estimated Creatinine Clearance: 13.4 mL/min (A) (by C-G formula based on SCr of 2.52 mg/dL (H)). Recent Labs  Lab 01/17/21 0816 01/17/21 1253 Feb 02, 2021 1422 2021/02/02 1603  WBC 13.6*  --  19.4*  --   LATICACIDVEN  --  2.2*  --  2.7*    Liver Function Tests: Recent Labs  Lab 01/17/21 0816 Feb 02, 2021 1422  AST 25 33  ALT 14 19  ALKPHOS 68 63  BILITOT 0.7 1.1  PROT 8.1 8.6*  ALBUMIN 4.7 4.1   Recent Labs  Lab 01/17/21 0816 02/02/21 1422  LIPASE 29 46   No results for input(s): AMMONIA in the last 168 hours.  ABG No results found for: PHART, PCO2ART, PO2ART, HCO3, TCO2, ACIDBASEDEF, O2SAT   Coagulation Profile: No results for input(s): INR, PROTIME in the last 168 hours.  Cardiac Enzymes: No results for input(s): CKTOTAL, CKMB,  CKMBINDEX, TROPONINI in the last 168 hours.  HbA1C: No results found for: HGBA1C  CBG: Recent Labs  Lab 02-02-21 2237  GLUCAP 125*    Review of Systems:   UTA- patient intubated & sedated, unable to participate in interview  Past Medical History:  She,  has a past medical history of Anginal pain (HCC) (09/06/2019), Arthritis, Bowel obstruction (HCC), CHF (congestive heart failure) (HCC), Depression, Diverticulitis, Dyspnea, GERD (gastroesophageal reflux disease), Gout, Gout, Heart murmur, Heart valve disorder, HLD (hyperlipidemia), HTN (hypertension), Hypothyroidism, Neuropathy, Presence of permanent cardiac pacemaker (1/26/201), and Wears dentures.   Surgical History:   Past Surgical History:  Procedure Laterality Date   ABDOMINAL HYSTERECTOMY     ABDOMINAL SURGERY     CATARACT EXTRACTION W/PHACO Left 09/15/2019   Procedure: CATARACT EXTRACTION PHACO AND INTRAOCULAR LENS PLACEMENT (IOC) LEFT;  Surgeon: Galen Manila, MD;  Location: [facility] ORS;  Service: Ophthalmology;  Laterality: Left;  Lot # [id number] H [place]: 00:38.8 CDE: 6.81   CATARACT EXTRACTION W/PHACO Right 10/13/2019   Procedure: CATARACT EXTRACTION  PHACO AND INTRAOCULAR LENS PLACEMENT (IOC) RIGHT;  Surgeon: Galen Manila, MD;  Location: [facility] ORS;  Service: Ophthalmology;  Laterality: Right;  US00:35.5 CDE5.35 [id number] H   COLON SURGERY     colon resection (for blockage)  with hernia repair   HERNIA REPAIR     and colon resection   IR ANGIOGRAM VISCERAL SELECTIVE  06/28/2018   PACEMAKER INSERTION     PPM GENERATOR CHANGEOUT N/A 07/04/2020   Procedure: PPM GENERATOR CHANGEOUT;  Surgeon: Marcina Millard, MD;  Location: [facility] INVASIVE CV LAB;  Service: Cardiovascular;  Laterality: N/A;   THYROIDECTOMY       Social History:   reports that she quit smoking about 34 years ago. Her smoking use included cigarettes. She has never used smokeless tobacco. She reports that she does not drink alcohol and does not use drugs.    Family History:  Her family history includes Cancer in her brother; Colon cancer in her sister; Heart attack in her brother; Hypertension in her sister; Thyroid disease in her mother and sister.   Allergies Allergies  Allergen Reactions   Propranolol Rash   Shellfish Allergy     gout     Home Medications  Prior to Admission medications   Medication Sig Start Date End Date Taking? Authorizing Provider  ALPRAZolam Prudy Feeler) 0.25 MG tablet Take 0.25 mg by mouth daily as needed for anxiety.    [provider]  amLODipine (NORVASC) 5 MG tablet Take 5 mg by mouth daily at 12 noon. 05/02/15   [provider]  aspirin EC 81 MG tablet Take 81 mg by mouth daily at 12 noon.    [provider]  atenolol (TENORMIN) 50 MG tablet Take 50 mg by mouth daily at 12 noon. 03/22/15   [provider]  escitalopram (LEXAPRO) 5 MG tablet Take 5 mg by mouth daily at 12 noon. 06/04/18 09/01/20  [provider]  ferrous sulfate 325 (65 FE) MG tablet Take 1 tablet (325 mg total) by mouth 2 (two) times daily with a meal. 05/29/15   [name], MD  gabapentin (NEURONTIN) 100 MG capsule Take 100 mg by mouth 3 (three) times daily.  03/22/15   [provider]  hydrochlorothiazide (HYDRODIURIL) 25 MG tablet Take 25 mg by mouth daily at 12 noon. 10/24/15   [provider]  isosorbide mononitrate (IMDUR) 30 MG 24 hr tablet Take 30 mg by mouth daily.  05/11/15   [provider]  lisinopril (PRINIVIL,ZESTRIL) 40 MG tablet Take 40 mg by mouth daily at 12 noon. 05/11/15   [provider]  meloxicam (MOBIC) 7.5 MG tablet Take 7.5 mg by mouth daily as needed for pain.     [provider]  Multiple Vitamin (MULTIVITAMIN WITH MINERALS) TABS tablet Take 1 tablet by mouth daily.    [provider]  pantoprazole (PROTONIX) 40 MG tablet Take 40 mg by mouth 2 (two) times daily.  04/21/18   [provider]  senna (SENOKOT) 8.6  MG tablet Take 1 tablet by mouth daily as needed for constipation.    [provider]  SYNTHROID 75 MCG tablet Take 75 mcg by mouth daily at 6 (six) AM. (0500) 05/22/15   [provider]     Critical care time: 60 minutes       Betsey Holiday, AGACNP-BC Acute Care Nurse Practitioner [facility]   [phone] / [phone] Please see Amion for pager details.

## 2021-01-19 NOTE — Op Note (Addendum)
Robotic assisted diagnostic laparoscopy, open repair of recurrent incarcerated right inguinal hernia, laparotomy with resection of small bowel x2.   Pre-operative Diagnosis: Small bowel obstruction secondary to recurrent incarcerated right inguinal hernia.  Post-operative Diagnosis: same.  With infarcted volvulized segment of small bowel within hernia.   Surgeon: Campbell Lerner, M.D., [facility]  Anesthesia: General endotracheal  Findings: [place], total loss of inguinal floor, extensive dense/chronic scarring of multiple loops of small bowel within large/multiloculated hernia sac.  Infarcted small bowel loop volvulized on mesentery.  Prior resection of small bowel.  Estimated Blood Loss: 100 mL         Specimens: 2 segments of small bowel.          Complications: none              Procedure Details  The patient was seen in the emergency room. The benefits, complications, treatment options, and expected outcomes were discussed with the patient, her power of attorney Annette Stable and her sister Carles Collet. The risks of bleeding, infection, recurrence of symptoms, failure to resolve symptoms, unanticipated injury, prosthetic placement, prosthetic infection, any of which could require further surgery were reviewed with the patient. The likelihood of improving the patient's symptoms with return to their baseline status is guarded.  The patient and/or family concurred with the proposed plan, giving informed consent.  The patient was taken to Operating Room, identified and the procedure verified.    Prior to the induction of general anesthesia, antibiotic prophylaxis was administered. VTE prophylaxis was in place.  General anesthesia was then administered. After the induction, the patient was positioned in the supine position, a Foley and NG tube was placed.  There was a 3.5 L NG tube output of dark bilious fluid.  And the abdomen and bilateral groins and pubic region was prepped with  Chloraprep and  draped in the sterile fashion.  A Time Out was held and the above information confirmed. After local infiltration of quarter percent Marcaine with epinephrine, stab incision was made left upper quadrant.  Just below the costal margin approximately midclavicular line the Veress needle is passed with sensation of the layers to penetrate the abdominal wall and into the peritoneum.  Saline drop test is confirmed peritoneal placement.  Insufflation is initiated with carbon dioxide to pressures of 15 mmHg. I placed 3 upper abdominal 8.5 mm robotic trochars without evidence of any intraperitoneal injury.  Immediate laparoscopy was initiated, and halted due to sustained V. tach.  Abdominal pressures were reduced and kept at 8 throughout the remainder of the procedure.  She was medicated by anesthesia and her heart rate and blood pressure remained stable and her heart rate reduced to the 60 range. We then docked the robot at her left side, and lysed the anterior omental adhesions to her previous midline scar. With 2 graspers, and robotic scissors I attempted to alleviate the scarring surrounding the internal inguinal hernia defect and to reduce the small bowel contents.  There was extensive dense scarring around the perimeter of the hernia defect.  The small bowel was quite friable and did not tolerate much grasping and pulling.  I worked circumferentially with the scarring that was clearly holding the contents within the hernia.  Posteriorly there was a loop of small bowel that appeared somewhat mobile, but remained disc quite distended slightly discolored and appeared quite fragile to minimal manipulation. After spending some time attempting to complete reduction of her hernia robotically, I felt it prudent to proceed on with an open surgery,  anticipating likely small bowel resection. We then proceeded to making an incision across the largest part of the hernia sac in the right groin.  Carefully dissected down to  the bowel contents within a very poorly defined hernia sac.  The hernia sac was quite irregular and the intestinal contents were densely scarred to the soft tissues on every aspect. It was impossible to sharply excise all of this dense scarring without having some significant injuries to the small bowel considering its friable and fragile nature.  This lysis of adhesions took the majority of our time. Once all of the adhesions were mobilized and we were completely free of the hernia contents it was clear that I had to resect a segment of small bowel I proceeded with resection prior to reducing the 2 free ends. Then proceeded with the midline incision.  I did not believe the 2 free ends were all the small bowel that we would be concerned about, and upon returning to the abdominal cavity identified an infarcted loop of small bowel that was clearly twisted on its mesentery. We proceeded with resection of these 2 areas, dividing the mesentery with the LigaSure, creating side to side anastomoses and 2 areas so as to maintain hopefully more than 100 cm of small bowel.  The common enterotomies were created with a GIA staplers and the enterotomy was closed with a TA stapler.  All staple lines were reinforced with 3-0 Vicryl's and the mesentery defects were closed with running 3-0 Vicryl.  Then irrigated the abdomen well. We attempted to replace the OG tube nasal gastrically without success.  The OG-tube was positioned well within the stomach. After multiple warm aliquots of saline confirming adequate irrigation of the peritoneal cavity, proceeded with suture repair in a McVay fashion of the right groin defect.  Utilized 0 Ethibond to complete the repair to the right femoral neurovascular sheath, and then transition to the best tissue available in the right groin for completion. Due to the extensive space left, I felt it prudent to drain the right groin soft tissues.  15 Blake drain was placed through the groin and  remaining transition site of the repair into the pelvis, exiting out the lateral proximal right thigh.  It was secured to the skin with 3-0 nylon. After mobilizing the transverse colon and omentum down to cover the midline incision, close the abdominal fascia with a running #1 PDS.  We then closed all the incisions with staples, applied honeycomb dressings and other dressings and drain dressing. We then transferred the patient to the PACU intubated in stable condition.  I left a message on voicemail for her sister, I was not able to contact Bill I did not have his contact information. Given report to ICU.     Campbell Lerner M.D., [facility] 01/14/2021 10:05 PM

## 2021-01-19 NOTE — Anesthesia Postprocedure Evaluation (Signed)
Anesthesia Post Note  Patient: Vanessa Lang  Procedure(s) Performed: XI ROBOTIC ASSISTED INGUINAL  (Right: Abdomen) HERNIA REPAIR INGUINAL INCARCERATED (Right) SMALL BOWEL RESECTION X 2 EXPLORATORY LAPAROTOMY  Patient location during evaluation: ICU Anesthesia Type: General Level of consciousness: sedated Pain management: pain level controlled Vital Signs Assessment: post-procedure vital signs reviewed and stable Respiratory status: patient on ventilator - see flowsheet for VS and patient remains intubated per anesthesia plan Cardiovascular status: stable Anesthetic complications: no Comments: Patient stable to ICU, ventilated, sedated. VSS, will check labs    No notable events documented.   Last Vitals:  Vitals:   01/20/2021 1630 01/29/2021 1700  BP: (!) 159/84 131/78  Pulse: 69 60  Resp: 20 18  Temp:    SpO2: 94% 92%    Last Pain:  Vitals:   01/23/2021 1703  TempSrc:   PainSc: 3                  Vanessa Lang

## 2021-01-19 NOTE — ED Triage Notes (Signed)
Pt comes ems from home with right sided abd pain r/t hernias. Pt has been vomiting all night. 4mg  zofran given by EMS. 20G L FA. CBG 164. bolus fluids. Has pacemaker.

## 2021-01-19 NOTE — Transfer of Care (Signed)
Immediate Anesthesia Transfer of Care Note  Patient: Vanessa Lang  Procedure(s) Performed: XI ROBOTIC ASSISTED INGUINAL  (Right: Abdomen) HERNIA REPAIR INGUINAL INCARCERATED (Right) SMALL BOWEL RESECTION X 2 EXPLORATORY LAPAROTOMY  Patient Location: ICU  Anesthesia Type:General  Level of Consciousness: sedated and Patient remains intubated per anesthesia plan  Airway & Oxygen Therapy: Patient remains intubated per anesthesia plan and Patient placed on Ventilator (see vital sign flow sheet for setting)  Post-op Assessment: Report given to RN and Post -op Vital signs reviewed and stable  Post vital signs: Reviewed and stable  Last Vitals:  Vitals Value Taken Time  BP 128/71 01/14/2021 2245  Temp 36.2 2246  Pulse 62 2246  Resp 15 01/17/2021 2246  SpO2 99 2246  Vitals shown include unvalidated device data.  Last Pain:  Vitals:   01/16/2021 1703  TempSrc:   PainSc: 3          Complications: No notable events documented.

## 2021-01-19 NOTE — ED Triage Notes (Signed)
Pt in w/pain to RLQ, came to ED 2 days ago for same r/t incarcerated hernia. GI was able to reduce the hernia last visit without surgery, but increased n/v and pain has persisted since last night. Now also on LLQ. Denies any cp, sob, and emesis is green per family.

## 2021-01-19 NOTE — H&P (Signed)
Patient ID: Vanessa Lang, female   DOB: December 08, 1922, 87 y.o.   MRN: [id number]  Chief Complaint: Small bowel obstruction secondary to incarcerated right inguinal hernia  History of Present Illness Vanessa Lang is a 87 y.o. female with a recent ED visit, just 2 days ago for the same at that time it was successfully reduced.  Unfortunately she had quite immediate recurrence of symptoms and progressive associated nausea and eventually biliary emesis and recurrence of the right groin pain.  Apparently she had a an emergency right inguinal hernia repair 2015.  She required a small bowel resection at that time, and no mesh was utilized.  She has had no flatus or bowel movement.  Denies fevers, chills, dyspnea, chest pain.    I reviewed her CT scan from September 15, today her acute renal injury appears to have worsened.  She has history of HTN, HLP, CHF, Pacemaker placement, and is also s/p prior right inguinal hernia/exlap as mentioned above in 2015, thyroidectomy, and hysterectomy.  She also has had admissions for GI bleed related to diverticular bleed.  Past Medical History Past Medical History:  Diagnosis Date   Anginal pain (HCC) 09/06/2019   last episode 2 months ago   Arthritis    Bowel obstruction (HCC)    CHF (congestive heart failure) (HCC)    Depression    Diverticulitis    Dyspnea    GERD (gastroesophageal reflux disease)    Gout    Gout    Heart murmur    Heart valve disorder    HLD (hyperlipidemia)    HTN (hypertension)    Hypothyroidism    Neuropathy    Presence of permanent cardiac pacemaker 1/26/201   [employer]onic [id number]   Wears dentures    partial upper, full lower      Past Surgical History:  Procedure Laterality Date   ABDOMINAL HYSTERECTOMY     ABDOMINAL SURGERY     CATARACT EXTRACTION W/PHACO Left 09/15/2019   Procedure: CATARACT EXTRACTION PHACO AND INTRAOCULAR LENS PLACEMENT (IOC) LEFT;  Surgeon: Galen Manila, MD;  Location: [facility];  Service:  Ophthalmology;  Laterality: Left;  Lot # [id number] H [place]: 00:38.8 CDE: 6.81   CATARACT EXTRACTION W/PHACO Right 10/13/2019   Procedure: CATARACT EXTRACTION PHACO AND INTRAOCULAR LENS PLACEMENT (IOC) RIGHT;  Surgeon: Galen Manila, MD;  Location: [facility];  Service: Ophthalmology;  Laterality: Right;  US00:35.5 CDE5.35 [id number] H   COLON SURGERY     colon resection (for blockage)  with hernia repair   HERNIA REPAIR     and colon resection   IR ANGIOGRAM VISCERAL SELECTIVE  06/28/2018   PACEMAKER INSERTION     PPM GENERATOR CHANGEOUT N/A 07/04/2020   Procedure: PPM GENERATOR CHANGEOUT;  Surgeon: Marcina Millard, MD;  Location: [facility];  Service: Cardiovascular;  Laterality: N/A;   THYROIDECTOMY      Allergies  Allergen Reactions   Propranolol Rash   Shellfish Allergy     gout    Current Facility-Administered Medications  Medication Dose Route Frequency Provider Last Rate Last Admin   ceFAZolin (ANCEF) IVPB 2g/100 mL premix  2 g Intravenous Once Campbell Lerner, MD       Current Outpatient Medications  Medication Sig Dispense Refill   ALPRAZolam (XANAX) 0.25 MG tablet Take 0.25 mg by mouth daily as needed for anxiety.     amLODipine (NORVASC) 5 MG tablet Take 5 mg by mouth daily at 12 noon.     aspirin EC 81 MG tablet Take 81 mg  by mouth daily at 12 noon.     atenolol (TENORMIN) 50 MG tablet Take 50 mg by mouth daily at 12 noon.     escitalopram (LEXAPRO) 5 MG tablet Take 5 mg by mouth daily at 12 noon.     ferrous sulfate 325 (65 FE) MG tablet Take 1 tablet (325 mg total) by mouth 2 (two) times daily with a meal. 60 tablet 3   gabapentin (NEURONTIN) 100 MG capsule Take 100 mg by mouth 3 (three) times daily.      hydrochlorothiazide (HYDRODIURIL) 25 MG tablet Take 25 mg by mouth daily at 12 noon.     isosorbide mononitrate (IMDUR) 30 MG 24 hr tablet Take 30 mg by mouth daily.      lisinopril (PRINIVIL,ZESTRIL) 40 MG tablet Take 40 mg by mouth daily at 12 noon.      meloxicam (MOBIC) 7.5 MG tablet Take 7.5 mg by mouth daily as needed for pain.      Multiple Vitamin (MULTIVITAMIN WITH MINERALS) TABS tablet Take 1 tablet by mouth daily.     pantoprazole (PROTONIX) 40 MG tablet Take 40 mg by mouth 2 (two) times daily.      senna (SENOKOT) 8.6 MG tablet Take 1 tablet by mouth daily as needed for constipation.     SYNTHROID 75 MCG tablet Take 75 mcg by mouth daily at 6 (six) AM. (0500)      Family History Family History  Problem Relation Age of Onset   Thyroid disease Mother    Hypertension Sister    Thyroid disease Sister    Heart attack Brother    Cancer Brother    Colon cancer Sister       Social History Social History   Tobacco Use   Smoking status: Former    Types: Cigarettes    Quit date: 1988    Years since quitting: 34.7   Smokeless tobacco: Never  Vaping Use   Vaping Use: Never used  Substance Use Topics   Alcohol use: No    Alcohol/week: 0.0 standard drinks   Drug use: No        Review of Systems  Constitutional:  Negative for chills and fever.  HENT:  Negative for sore throat.   Eyes:  Negative for blurred vision.  Respiratory:  Negative for shortness of breath.   Cardiovascular:  Negative for chest pain.  Gastrointestinal:  Positive for abdominal pain, nausea and vomiting.  Genitourinary:  Positive for dysuria.  Musculoskeletal:  Negative for joint pain.  Skin:  Negative for rash.  Neurological:  Negative for tingling, weakness and headaches.  Psychiatric/Behavioral:  Negative for depression and suicidal ideas.   All other systems reviewed and are negative.   Physical Exam Blood pressure (!) 159/84, pulse 69, temperature 97.8 F (36.6 C), temperature source Oral, resp. rate 20, height 5\' 10"  (1.778 m), weight 65 kg, SpO2 94 %. Last Weight  Most recent update: 01/12/2021  3:31 PM    Weight  65 kg (143 lb 4.8 oz)             CONSTITUTIONAL: Well petite thin elderly female, appropriately responsive and aware  without distress.   EYES: Sclera non-icteric.   EARS, NOSE, MOUTH AND THROAT:  The oropharynx is clear. Hearing is intact to voice.  NECK: Trachea is midline, and there is no jugular venous distension.  LYMPH NODES:  Lymph nodes in the neck are not enlarged. RESPIRATORY:  Lungs are clear, and breath sounds are equal bilaterally. Normal respiratory  effort without pathologic use of accessory muscles. CARDIOVASCULAR: Heart is regular in rate and rhythm. GI: The abdomen is otherwise soft, nontender, and distended. There were no palpable masses, in addition to the right groin mass. I did not appreciate hepatosplenomegaly.  GU: Large bulge in right groin consistent with recurrent incarcerated hernia, multiple maneuvers including IV morphine ice packs, Trendelenburg positioning, and frog-leg positioning as tolerated or not helpful and successful in multiple reduction attempts.  Right groin scar noted and infraumbilical midline scar noted. MUSCULOSKELETAL:  Symmetrical muscle tone appreciated in all four extremities.    SKIN: Skin turgor is normal. No pathologic skin lesions appreciated.  NEUROLOGIC:  Motor and sensation appear grossly normal.  Cranial nerves are grossly without defect. PSYCH:  Alert and oriented to person, place and time. Affect is appropriate for situation.  Data Reviewed I have personally reviewed what is currently available of the patient's imaging, recent labs and medical records.   Labs:  CBC Latest Ref Rng & Units 01/31/2021 01/17/2021 07/29/2019  WBC 4.0 - 10.5 K/uL 19.4(H) 13.6(H) 7.3  Hemoglobin 12.0 - 15.0 g/dL 09.8 11.9 11.6(L)  Hematocrit 36.0 - 46.0 % 40.9 38.4 35.5(L)  Platelets 150 - 400 K/uL 220 192 158   CMP Latest Ref Rng & Units 01/28/2021 01/17/2021 07/29/2019  Glucose 70 - 99 mg/dL 147(W) 295(A) 213(Y)  BUN 8 - 23 mg/dL 86(V) 78(I) 69(G)  Creatinine 0.44 - 1.00 mg/dL 2.95(M) 8.41(L) 2.44(W)  Sodium 135 - 145 mmol/L 138 136 140  Potassium 3.5 - 5.1 mmol/L 3.8 3.8  4.2  Chloride 98 - 111 mmol/L 96(L) 99 106  CO2 22 - 32 mmol/L 25 27 24   Calcium 8.9 - 10.3 mg/dL 9.8 9.4  Total Protein 6.5 - 8.1 g/dL 10.2) 8.1 7.7  Total Bilirubin 0.3 - 1.2 mg/dL 1.1 0.7 0.5  Alkaline Phos 38 - 126 U/L 63 68 94  AST 15 - 41 U/L 33 25 20  ALT 0 - 44 U/L 19 14 14       Imaging: Radiology review:  Images personally reviewed from 2 days ago, accompanied with the report below. EXAM: CT ABDOMEN AND PELVIS WITH CONTRAST   TECHNIQUE: Multidetector CT imaging of the abdomen and pelvis was performed using the standard protocol following bolus administration of intravenous contrast.   CONTRAST:  51mL OMNIPAQUE IOHEXOL 350 MG/ML SOLN   COMPARISON:  To 10/23/2018   FINDINGS: Lower chest: Unremarkable.   Hepatobiliary: No suspicious focal abnormality within the liver parenchyma. There is no evidence for gallstones, gallbladder wall thickening, or pericholecystic fluid. Common bile duct measures upper normal diameter at 6 mm.   Pancreas: Diffuse dilatation of the main pancreatic duct, similar to prior without mass lesion or peripancreatic edema.   Spleen: No splenomegaly. No focal mass lesion.   Adrenals/Urinary Tract: No adrenal nodule or mass. Multiple lesions of varying size and attenuation are identified in both kidneys, similar to prior and compatible with simple and complex cysts containing varying degrees of proteinaceous debris/hemorrhage. Some of the cysts demonstrate peripheral calcification, as before. No overtly suspicious enhancing mass lesion in either kidney. No evidence for hydroureter. The urinary bladder appears normal for the degree of distention.   Stomach/Bowel: Stomach is decompressed. Duodenum is normally positioned as is the ligament of Treitz. Small bowel loops in the left abdomen are dilated up to 3.1 cm diameter. There is a large right groin hernia containing small bowel loops including a markedly distended 3.5 cm  fluid-filled loop of small bowel the dilated small  bowel loops in the left abdomen track out into the hernia with decompressed small bowel tracking back into the peritoneal cavity. Diffuse diverticular disease is seen in the colon.   Vascular/Lymphatic: There is moderate atherosclerotic calcification of the abdominal aorta without aneurysm. There is no gastrohepatic or hepatoduodenal ligament lymphadenopathy. No retroperitoneal or mesenteric lymphadenopathy. No pelvic sidewall lymphadenopathy.   Reproductive: No adnexal mass.   Other: Small volume free fluid seen in the pelvis.   Musculoskeletal: No worrisome lytic or sclerotic osseous abnormality.   IMPRESSION: 1. Large right groin hernia containing small bowel loops. Dilated small bowel in the left abdomen tracks down into the hernia where a markedly distended fluid-filled loop of small bowel is identified with additional decompressed small bowel loops tracking back into the peritoneal cavity. Colon is nondilated. Imaging features compatible with small bowel obstruction secondary to incarcerated right groin hernia. 2. Small volume free fluid in the pelvis. 3. Multiple bilateral simple and complex renal cysts containing varying degrees of proteinaceous debris/hemorrhage. 4. Diffuse dilatation of the main pancreatic duct, similar to prior. 5. Aortic Atherosclerosis (ICD10-I70.0).     Electronically Signed   By: Kennith Center M.D.   On: 01/17/2021 12:05   Within last 24 hrs: No results found.  Assessment    Small bowel obstruction secondary to recurrent incarcerated right inguinal hernia Patient Active Problem List   Diagnosis Date Noted   Incarcerated right inguinal hernia    Malnutrition of moderate degree 07/01/2018   GI bleed 05/26/2015   HTN (hypertension) 05/26/2015   Hypothyroid 05/26/2015   Chronic diastolic CHF (congestive heart failure) (HCC) 05/26/2015   AKI (acute kidney injury) (HCC) 05/26/2015     Plan    Various repair options discussed including those that may involve lesser degrees of anesthesia, i.e. spinal or local. Laparoscopic approach allows ready visualization and appreciation of the condition of the small bowel, as well as the ability to surgically excise any compromise small bowel.  Robotic assisted incarcerated right inguinal hernia repair is proposed, considering the potential complications as this is indeed a recurrent right inguinal hernia.  I believe the general anesthetic risks carry the greatest gravity, and these were reviewed with both Bill and with Wallis Bamberg, the patient's sister.  I discussed the surgery risks which include recurrence which can be up to 30% in the case of complex hernias, use of prosthetic materials (mesh) and the increased risk of infection and the possible need for re-operation and removal of mesh, possibility of post-op SBO or ileus, and the risks of general anesthetic including heart attack, stroke, sudden death or some reaction to anesthetic medications. The patient, and those present, appear to understand the risks, any and all questions were answered to the patient's satisfaction.  No guarantees were ever expressed or implied.   Face-to-face time spent with the patient and accompanying care providers(if present) was 50 minutes, with more than 50% of the time spent counseling, educating, and coordinating care of the patient.    These notes generated with voice recognition software. I apologize for typographical errors.  Campbell Lerner M.D., FACS 01/24/2021, 4:56 PM

## 2021-01-19 NOTE — Progress Notes (Signed)
Pharmacy Antibiotic Note  Vanessa Lang is a 87 y.o. female admitted on 01/11/2021 with  intra-abdominal infection .  Pharmacy has been consulted for Zosyn dosing.   Pt is currently in AKI.   Plan: Zosyn 3.375 gm IV Q12H to start 9/18 @ ~ 0000.   Height: 5\' 10"  (177.8 cm) Weight: 65 kg (143 lb 4.8 oz) IBW/kg (Calculated) : 68.5  Temp (24hrs), Avg:97.8 F (36.6 C), Min:97.8 F (36.6 C), Max:97.8 F (36.6 C)  Recent Labs  Lab 01/17/21 0816 01/17/21 1253 01/27/2021 1422 01/18/2021 1603  WBC 13.6*  --  19.4*  --   CREATININE 1.24*  --  2.52*  --   LATICACIDVEN  --  2.2*  --  2.7*    Estimated Creatinine Clearance: 13.4 mL/min (A) (by C-G formula based on SCr of 2.52 mg/dL (H)).    Allergies  Allergen Reactions   Propranolol Rash   Shellfish Allergy     gout    Antimicrobials this admission:   >>    >>   Dose adjustments this admission:   Microbiology results:  BCx:   UCx:    Sputum:    MRSA PCR:   Thank you for allowing pharmacy to be a part of this patient's care.  Marguerita Stapp D 01/30/2021 11:14 PM

## 2021-01-19 NOTE — Progress Notes (Signed)
PHARMACY NOTE:  ANTIMICROBIAL RENAL DOSAGE ADJUSTMENT  Current antimicrobial regimen includes a mismatch between antimicrobial dosage and estimated renal function.  As per policy approved by the Pharmacy & Therapeutics and Medical Executive Committees, the antimicrobial dosage will be adjusted accordingly.  Current antimicrobial dosage:  Zosyn 3.375 IV EI every 8 hours   Indication: Intraabdominal infection   Renal Function:  Estimated Creatinine Clearance: 13.4 mL/min (A) (by C-G formula based on SCr of 2.52 mg/dL (H)).     Antimicrobial dosage has been changed to:  Zosyn 3.375 IV EI every 12 hours for CrCl < 8ml/min  Additional comments:   Thank you for allowing pharmacy to be a part of this patient's care.  Gardner Candle, PharmD, BCPS Clinical Pharmacist 01/24/2021 8:17 PM

## 2021-01-20 DIAGNOSIS — Z950 Presence of cardiac pacemaker: Secondary | ICD-10-CM

## 2021-01-20 DIAGNOSIS — Z9911 Dependence on respirator [ventilator] status: Secondary | ICD-10-CM

## 2021-01-20 DIAGNOSIS — K4030 Unilateral inguinal hernia, with obstruction, without gangrene, not specified as recurrent: Secondary | ICD-10-CM | POA: Diagnosis not present

## 2021-01-20 DIAGNOSIS — I471 Supraventricular tachycardia, unspecified: Secondary | ICD-10-CM

## 2021-01-20 DIAGNOSIS — Z978 Presence of other specified devices: Secondary | ICD-10-CM

## 2021-01-20 LAB — COMPREHENSIVE METABOLIC PANEL
ALT: 19 U/L (ref 0–44)
AST: 29 U/L (ref 15–41)
Albumin: 2.8 g/dL — ABNORMAL LOW (ref 3.5–5.0)
Alkaline Phosphatase: 44 U/L (ref 38–126)
Anion gap: 12 (ref 5–15)
BUN: 65 mg/dL — ABNORMAL HIGH (ref 8–23)
CO2: 24 mmol/L (ref 22–32)
Calcium: 8.6 mg/dL — ABNORMAL LOW (ref 8.9–10.3)
Chloride: 101 mmol/L (ref 98–111)
Creatinine, Ser: 2.6 mg/dL — ABNORMAL HIGH (ref 0.44–1.00)
GFR, Estimated: 16 mL/min — ABNORMAL LOW (ref >60)
Glucose, Bld: 113 mg/dL — ABNORMAL HIGH (ref 70–99)
Potassium: 4.1 mmol/L (ref 3.5–5.1)
Sodium: 137 mmol/L (ref 135–145)
Total Bilirubin: 0.8 mg/dL (ref 0.3–1.2)
Total Protein: 6.1 g/dL — ABNORMAL LOW (ref 6.5–8.1)

## 2021-01-20 LAB — CBC
HCT: 37.2 % (ref 36.0–46.0)
Hemoglobin: 12.2 g/dL (ref 12.0–15.0)
MCH: 31.7 pg (ref 26.0–34.0)
MCHC: 32.8 g/dL (ref 30.0–36.0)
MCV: 96.6 fL (ref 80.0–100.0)
Platelets: 191 10*3/uL (ref 150–400)
RBC: 3.85 MIL/uL — ABNORMAL LOW (ref 3.87–5.11)
RDW: 13 % (ref 11.5–15.5)
WBC: 8.7 10*3/uL (ref 4.0–10.5)
nRBC: 0 % (ref 0.0–0.2)

## 2021-01-20 LAB — URINALYSIS, COMPLETE (UACMP) WITH MICROSCOPIC
Bilirubin Urine: NEGATIVE
Glucose, UA: NEGATIVE mg/dL
Ketones, ur: NEGATIVE mg/dL
Leukocytes,Ua: NEGATIVE
Nitrite: NEGATIVE
Protein, ur: 30 mg/dL — AB
Specific Gravity, Urine: 1.02 (ref 1.005–1.030)
pH: 5 (ref 5.0–8.0)

## 2021-01-20 LAB — BLOOD GAS, ARTERIAL
Acid-base deficit: 5.5 mmol/L — ABNORMAL HIGH (ref 0.0–2.0)
Bicarbonate: 19.1 mmol/L — ABNORMAL LOW (ref 20.0–28.0)
FIO2: 0.4
O2 Saturation: 99 %
PEEP: 5 cmH2O
Patient temperature: 37
Pressure support: 5 cmH2O
pCO2 arterial: 33 mmHg (ref 32.0–48.0)
pH, Arterial: 7.37 (ref 7.350–7.450)
pO2, Arterial: 135 mmHg — ABNORMAL HIGH (ref 83.0–108.0)

## 2021-01-20 LAB — GLUCOSE, CAPILLARY
Glucose-Capillary: 108 mg/dL — ABNORMAL HIGH (ref 70–99)
Glucose-Capillary: 62 mg/dL — ABNORMAL LOW (ref 70–99)
Glucose-Capillary: 64 mg/dL — ABNORMAL LOW (ref 70–99)
Glucose-Capillary: 76 mg/dL (ref 70–99)
Glucose-Capillary: 80 mg/dL (ref 70–99)
Glucose-Capillary: 83 mg/dL (ref 70–99)
Glucose-Capillary: 92 mg/dL (ref 70–99)
Glucose-Capillary: 96 mg/dL (ref 70–99)

## 2021-01-20 LAB — HEMOGLOBIN A1C
Hgb A1c MFr Bld: 5.4 % (ref 4.8–5.6)
Mean Plasma Glucose: 108.28 mg/dL

## 2021-01-20 LAB — MAGNESIUM: Magnesium: 2.1 mg/dL (ref 1.7–2.4)

## 2021-01-20 LAB — TRIGLYCERIDES: Triglycerides: 116 mg/dL (ref <150)

## 2021-01-20 LAB — PHOSPHORUS: Phosphorus: 6.3 mg/dL — ABNORMAL HIGH (ref 2.5–4.6)

## 2021-01-20 LAB — MRSA NEXT GEN BY PCR, NASAL: MRSA by PCR Next Gen: NOT DETECTED

## 2021-01-20 MED ORDER — CALCIUM GLUCONATE-NACL 1-0.675 GM/50ML-% IV SOLN
1.0000 g | Freq: Once | INTRAVENOUS | Status: AC
  Administered 2021-01-20: 1000 mg via INTRAVENOUS
  Filled 2021-01-20: qty 50

## 2021-01-20 MED ORDER — FAMOTIDINE 20 MG IN NS 100 ML IVPB
20.0000 mg | INTRAVENOUS | Status: DC
  Administered 2021-01-21 – 2021-01-24 (×4): 20 mg via INTRAVENOUS
  Filled 2021-01-20 (×4): qty 100

## 2021-01-20 MED ORDER — LACTATED RINGERS IV SOLN: INTRAVENOUS | Status: AC

## 2021-01-20 MED ORDER — ACETAMINOPHEN 650 MG RE SUPP
650.0000 mg | Freq: Four times a day (QID) | RECTAL | Status: DC | PRN
  Administered 2021-01-20: 650 mg via RECTAL

## 2021-01-20 MED ORDER — LACTATED RINGERS IV BOLUS
500.0000 mL | Freq: Once | INTRAVENOUS | Status: AC
  Administered 2021-01-20: 500 mL via INTRAVENOUS

## 2021-01-20 MED ORDER — LACTATED RINGERS IV BOLUS
1000.0000 mL | Freq: Once | INTRAVENOUS | Status: AC
  Administered 2021-01-20: 1000 mL via INTRAVENOUS

## 2021-01-20 MED ORDER — LACTATED RINGERS IV SOLN: INTRAVENOUS | Status: DC

## 2021-01-20 MED ORDER — CHLORHEXIDINE GLUCONATE CLOTH 2 % EX PADS
6.0000 | MEDICATED_PAD | Freq: Every day | CUTANEOUS | Status: DC
  Administered 2021-01-20 – 2021-01-24 (×6): 6 via TOPICAL

## 2021-01-20 MED ORDER — DEXTROSE 50 % IV SOLN
INTRAVENOUS | Status: AC
  Administered 2021-01-20: 25 g via INTRAVENOUS
  Filled 2021-01-20: qty 50

## 2021-01-20 MED ORDER — ORAL CARE MOUTH RINSE
15.0000 mL | OROMUCOSAL | Status: DC
  Administered 2021-01-20 – 2021-01-22 (×19): 15 mL via OROMUCOSAL

## 2021-01-20 MED ORDER — DEXTROSE 50 % IV SOLN: 25.0000 g | Freq: Once | INTRAVENOUS | Status: AC

## 2021-01-20 MED ORDER — ALBUTEROL SULFATE (2.5 MG/3ML) 0.083% IN NEBU: 2.5000 mg | INHALATION_SOLUTION | Freq: Four times a day (QID) | RESPIRATORY_TRACT | Status: DC | PRN

## 2021-01-20 MED ORDER — FENTANYL CITRATE (PF) 100 MCG/2ML IJ SOLN
25.0000 ug | INTRAMUSCULAR | Status: DC | PRN
  Administered 2021-01-21: 25 ug via INTRAVENOUS
  Filled 2021-01-20: qty 2

## 2021-01-20 MED ORDER — DEXTROSE IN LACTATED RINGERS 5 % IV SOLN: INTRAVENOUS | Status: AC

## 2021-01-20 MED ORDER — CHLORHEXIDINE GLUCONATE 0.12% ORAL RINSE (MEDLINE KIT)
15.0000 mL | Freq: Two times a day (BID) | OROMUCOSAL | Status: DC
  Administered 2021-01-20 – 2021-01-25 (×8): 15 mL via OROMUCOSAL

## 2021-01-20 NOTE — Progress Notes (Signed)
PHARMACY CONSULT NOTE - FOLLOW UP  Pharmacy Consult for Electrolyte Monitoring and Replacement   Recent Labs: Potassium (mmol/L)  Date Value  01/20/2021 4.1  04/06/2014 3.6   Magnesium (mg/dL)  Date Value  34/74/2595 2.1  04/06/2014 1.9   Calcium (mg/dL)  Date Value  63/87/5643 8.6 (L)   Calcium, Total (mg/dL)  Date Value  32/95/1884 8.1 (L)   Albumin (g/dL)  Date Value  16/60/6301 2.8 (L)  03/29/2014 4.1   Phosphorus (mg/dL)  Date Value  60/02/9322 6.3 (H)  04/03/2014 2.5   Sodium (mmol/L)  Date Value  01/20/2021 137  04/05/2014 142     Assessment: 87 year old female presenting at [facility] ED on 2021/02/09 with complaints of severe right lower quadrant pain for the last 2 days.  Of note she had a recent ED visit 2 days ago for a right inguinal hernia that was successfully reduced in the ED. After bedside assessment decision was made to proceed with surgical exploration and repair.  At that time patient denied fevers chills dyspnea or chest pain, and reported no flatus or bowel movement. Patient taken urgently to OR for diagnostic laparoscopy and open repair of recurrent incarcerated right inguinal hernia, laparotomy with resection of small bowel x 2.  The patient was intubated requiring mechanical ventilation for the procedure.   Phosphorus is trending up. On LR @ 170ml/hr.   Goal of Therapy:  Electrolytes WNL   Plan:  No replacement needed at this time.  F/u with AM labs.   Ronnald Ramp ,PharmD Clinical Pharmacist 01/20/2021 7:37 AM

## 2021-01-20 NOTE — Progress Notes (Signed)
PHARMACY CONSULT NOTE - FOLLOW UP  Pharmacy Consult for Electrolyte Monitoring and Replacement   Recent Labs: Potassium (mmol/L)  Date Value  2021/01/30 3.6  04/06/2014 3.6   Magnesium (mg/dL)  Date Value  00/92/3300 2.1  04/06/2014 1.9   Calcium (mg/dL)  Date Value  76/22/6333 8.3 (L)   Calcium, Total (mg/dL)  Date Value  54/56/2563 8.1 (L)   Albumin (g/dL)  Date Value  89/37/3428 4.1  03/29/2014 4.1   Phosphorus (mg/dL)  Date Value  76/81/1572 5.4 (H)  04/03/2014 2.5   Sodium (mmol/L)  Date Value  01/30/21 137  04/05/2014 142     Assessment: 9/17:  Ca @ 2309 = 8.3,   Albumin @ 1422 = 4.1           Corrected Ca = 8.22  Goal of Therapy:  Electrolytes WNL   Plan:  Will order Calcium gluconate 1 gm IVPB X 1 and recheck electrolytes on 9/18 with AM labs.   Scherrie Gerlach ,PharmD Clinical Pharmacist 01/20/2021 12:07 AM

## 2021-01-20 NOTE — Progress Notes (Signed)
NAME:  Vanessa Lang, MRN:  [id number], DOB:  16-Aug-1922, LOS: 1 ADMISSION DATE:  01/27/2021   SYNOPSIS/HPI 87 year old female presenting at [facility] ED on 01/14/2021 with complaints of severe right lower quadrant pain for the last 2 days.  Of note she had a recent ED visit 2 days ago for a right inguinal hernia that was successfully reduced in the ED which improved her symptoms at that time and she was discharged home.    CT abdomen pelvis with contrast from 01/17/2021 >> large right groin hernia containing small bowel loops.  Dilated small bowel in the left abdomen tracks down to the hernia were markedly distended fluid-filled loops of small bowel is identified with additional decompressed small bowel loops tracking back into the peritoneal cavity.  Colon is nondilated imaging features compatible with small bowel obstruction secondary to incarcerated right groin hernia.  Multiple bilateral simple and complex renal cysts containing varying degrees of proteinaceous debris/hemorrhage.  Small volume free fluid in the pelvis, diffuse dilatation of the main pancreatic duct similar to prior. After bedside assessment decision was made to proceed with surgical exploration and repair.  Patient taken urgently to OR for diagnostic laparoscopy and open repair of recurrent incarcerated right inguinal hernia, laparotomy with resection of small bowel x 2.    The patient was intubated requiring mechanical ventilation for the procedure.  During the procedure patient had 3.5 L of NG tube output of dark bilious fluid.  The OR team also reported the patient had 2 runs of SVT which were treated with esmolol and lidocaine during the procedure.  Overall the patient remained hemodynamically stable, and was transferred to ICU still requiring mechanical ventilation.  PCCM consulted for assistance in ventilator management and monitoring.   Pertinent  Medical History  Hypertension HFpEF Severe aortic stenosis Presence of  permanent pacemaker Diverticulitis with GIB Gout Hyperlipidemia Hypothyroidism Inguinal hernia s/p repair and small bowel resection - 2015 Former smoker- quit 1988 Bradycardia CKD Stage 3b Anxiety/Depression   Significant Hospital Events: Including procedures, antibiotic start and stop dates in addition to other pertinent events   01/05/2021 patient taken urgently to the OR for incarcerated right inguinal hernia repair and small bowel resection x 2, transferred to ICU requiring mechanical ventilation support 9/18 remains on vent   Interim History / Subjective:  Remains critically ill Remains on vent On pressors +midline incision, otherwise multiple honeycomb sites clean dry and intact.  RLQ JP drain with sanguinous fluid.    Micro Data:  9/15 Blood cultures pending 9/17 COVID NEG   Antimicrobials:   Antibiotics Given (last 72 hours)     Date/Time Action Medication Dose   01/17/2021 1817 Given   ceFAZolin (ANCEF) IVPB 2g/100 mL premix 2 g           Objective   Blood pressure 109/63, pulse (!) 58, temperature 97.7 F (36.5 C), temperature source Axillary, resp. rate 13, height 5' 10" (1.778 m), weight 65 kg, SpO2 92 %.    Vent Mode: PRVC FiO2 (%):  [40 %-50 %] 40 % Set Rate:  [14 bmp] 14 bmp Vt Set:  [430 mL] 430 mL PEEP:  [5 cmH20] 5 cmH20   Intake/Output Summary (Last 24 hours) at 01/20/2021 0734 Last data filed at 01/20/2021 0600 Gross per 24 hour  Intake 5122.55 ml  Output 3715 ml  Net 1407.55 ml   Filed Weights   01/08/2021 1159 01/05/2021 1531  Weight: 65 kg 65 kg      REVIEW OF SYSTEMS  PATIENT IS UNABLE TO PROVIDE COMPLETE REVIEW OF SYSTEMS DUE TO SEVERE CRITICAL ILLNESS AND TOXIC METABOLIC ENCEPHALOPATHY  ALL OTHER ROS ARE NEGATIVE   PHYSICAL EXAMINATION:  GENERAL:critically ill appearing, +resp distress EYES: Pupils equal, round, reactive to light.  No scleral icterus.  MOUTH: Moist mucosal membrane. INTUBATED NECK: Supple.  PULMONARY:  +rhonchi,  CARDIOVASCULAR: S1 and S2.  No murmurs  GASTROINTESTINAL: +midline incision, otherwise multiple honeycomb sites clean dry and intact.  RLQ JP drainoft, nontender, +distended. NEG bowel sounds.  MUSCULOSKELETAL: No swelling, clubbing, or edema.  NEUROLOGIC: obtunded SKIN:intact,warm,dry    Labs/imaging that I havepersonally reviewed  (right click and "Reselect all SmartList Selections" daily)       ASSESSMENT AND PLAN SYNOPSIS  87 yo female with s/p Incarcerated right inguinal hernia status post repair and small bowel resection x 2 Acute Respiratory Failure in the setting of Incarcerated Right inguinal hernia repair and small bowel resection complicated by renal failure  Severe ACUTE Hypoxic and Hypercapnic Respiratory Failure -continue Mechanical Ventilator support -continue Bronchodilator Therapy -Wean Fio2 and PEEP as tolerated -VAP/VENT bundle implementation -will perform SAT/SBT when respiratory parameters are met  Vent Mode: PRVC FiO2 (%):  [40 %-50 %] 40 % Set Rate:  [14 bmp] 14 bmp Vt Set:  [430 mL] 430 mL PEEP:  [5 cmH20] 5 cmH20   CARDIAC ICU monitoring   ACUTE KIDNEY INJURY/Renal Failure -continue Foley Catheter-assess need -Avoid nephrotoxic agents -Follow urine output, BMP -Ensure adequate renal perfusion, optimize oxygenation -Renal dose medications   Intake/Output Summary (Last 24 hours) at 01/20/2021 0734 Last data filed at 01/20/2021 0600 Gross per 24 hour  Intake 5122.55 ml  Output 3715 ml  Net 1407.55 ml   BMP Latest Ref Rng & Units 01/20/2021 01/29/2021 01/18/2021  Glucose 70 - 99 mg/dL 113(H) 155(H) 150(H)  BUN 8 - 23 mg/dL 65(H) 63(H) 64(H)  Creatinine 0.44 - 1.00 mg/dL 2.60(H) 2.45(H) 2.52(H)  Sodium 135 - 145 mmol/L 137 137 138  Potassium 3.5 - 5.1 mmol/L 4.1 3.6 3.8  Chloride 98 - 111 mmol/L 101 101 96(L)  CO2 22 - 32 mmol/L _0 Calcium 8.9 - 10.3 mg/dL 8.6(L) 8.3(L) 10.0     NEUROLOGY Acute toxic metabolic  encephalopathy, need for sedation Goal RASS -2 to -3   SEVERE SEPSIS SOURCE-incarcerated hernia s/p bowel resection -use vasopressors to keep MAP>65 as needed -follow ABG and LA -follow up cultures -emperic ABX   INFECTIOUS DISEASE -continue antibiotics as prescribed -follow up cultures  ENDO - ICU hypoglycemic\Hyperglycemia protocol -check FSBS per protocol   GI GI PROPHYLAXIS as indicated  NUTRITIONAL STATUS DIET-->NPO Constipation protocol as indicated   ELECTROLYTES -follow labs as needed -replace as needed -pharmacy consultation and following     Best practice (right click and "Reselect all SmartList Selections" daily)  Diet:  NPO Pain/Anxiety/Delirium protocol (if indicated): Yes (RASS goal -2) VAP protocol (if indicated): Yes DVT prophylaxis: Subcutaneous Heparin GI prophylaxis: H2B Arterial line:  Yes, and it is still needed Foley:  N/A Mobility:  bed rest  Code Status:  FULL CODE Disposition: ICU  Labs   CBC: Recent Labs  Lab 01/17/21 0816 01/30/2021 1422 01/29/2021 2309 01/20/21 0358  WBC 13.6* 19.4* 3.6* 8.7  HGB 12.9 13.2 12.2 12.2  HCT 38.4 40.9 35.9* 37.2  MCV 96.2 94.9 95.5 96.6  PLT 192 220 178 758    Basic Metabolic Panel: Recent Labs  Lab 01/17/21 0816 01/22/2021 1422 01/21/2021 2309 01/20/21 0358  NA 136 138 137 137  K 3.8 3.8 3.6 4.1  CL 99 96* 101 101  CO2 _0 GLUCOSE 142* 150* 155* 113*  BUN 28* 64* 63* 65*  CREATININE 1.24* 2.52* 2.45* 2.60*  CALCIUM 9.8 10.0 8.3* 8.6*  MG  --   --  2.1 2.1  PHOS  --   --  5.4* 6.3*   GFR: Estimated Creatinine Clearance: 13 mL/min (A) (by C-G formula based on SCr of 2.6 mg/dL (H)). Recent Labs  Lab 01/17/21 0816 01/17/21 1253 01/03/2021 1422 01/04/2021 1603 01/07/2021 2309 01/20/21 0358  WBC 13.6*  --  19.4*  --  3.6* 8.7  LATICACIDVEN  --  2.2*  --  2.7* 1.0  --     Liver Function Tests: Recent Labs  Lab 01/17/21 0816 01/09/2021 1422 01/20/21 0358  AST 25 33 29   ALT _1 ALKPHOS 68 63 44  BILITOT 0.7 1.1 0.8  PROT 8.1 8.6* 6.1*  ALBUMIN 4.7 4.1 2.8*   Recent Labs  Lab 01/17/21 0816 01/04/2021 1422  LIPASE 29 46   No results for input(s): AMMONIA in the last 168 hours.  ABG    Component Value Date/Time   PHART 7.40 01/30/2021 2303   PCO2ART 42 01/31/2021 2303   PO2ART 168 (H) 01/21/2021 2303   HCO3 26.0 01/23/2021 2303   O2SAT 99.5 01/16/2021 2303    HbA1C: Hgb A1c MFr Bld  Date/Time Value Ref Range Status  01/12/2021 11:09 PM 5.4 4.8 - 5.6 % Final    Comment:    (NOTE) Pre diabetes:          5.7%-6.4%  Diabetes:              >6.4%  Glycemic control for   <7.0% adults with diabetes     CBG: Recent Labs  Lab 01/03/2021 2237 01/20/21 0002 01/20/21 0416  GLUCAP 125* 108* 96    Allergies Allergies  Allergen Reactions   Propranolol Rash   Shellfish Allergy     gout       DVT/GI PRX  assessed I Assessed the need for Labs I Assessed the need for Foley I Assessed the need for Central Venous Line Family Discussion when available I Assessed the need for Mobilization I made an Assessment of medications to be adjusted accordingly Safety Risk assessment completed    Critical Care Time devoted to patient care services described in this note is 55 minutes.  Critical care was necessary to treat or prevent imminent or life-threatening deterioration.   PATIENT WITH VERY POOR PROGNOSIS I ANTICIPATE PROLONGED ICU LOS  Patient with Multiorgan failure and at high risk for cardiac arrest and death.    Corrin Parker, M.D.  [facility]  Medical Director [place] Director [facility] Cardio-Pulmonary Department

## 2021-01-20 NOTE — Progress Notes (Signed)
Patient in Normal Sinus Rhythm, on SBT. Patient vitals stable, but still requiring Neo. Patient is drowsy. Plan is to extubate when patient becomes more alert. Inadequate urine output, NP made award. Bolus given. Tylenol given for fever.

## 2021-01-20 NOTE — Progress Notes (Signed)
[facility] SURGICAL PROGRESS NOTE  Hospital Day(s): 1.   Post op day(s): 1 Day Post-Op.   Interval History: Patient seen and examined, no acute events or new complaints overnight. Patient intubated.  In ICU on vasopressors, heart rate appears well controlled.  Are weaning sedation for evaluation for possible extubation.  Review of Systems:  Not obtainable  Vital signs in last 24 hours: [min-max] current  Temp:  [97.7 F (36.5 C)-97.8 F (36.6 C)] 97.7 F (36.5 C) (09/18 0400) Pulse Rate:  [56-72] 58 (09/18 0600) Resp:  [12-26] 13 (09/18 0600) BP: (74-160)/(55-90) 109/63 (09/18 0600) SpO2:  [92 %-100 %] 92 % (09/18 0600) Arterial Line BP: (66-150)/(36-65) 100/44 (09/18 0600) FiO2 (%):  [40 %-50 %] 40 % (09/18 0416) Weight:  [65 kg] 65 kg (09/17 1531)     Height: 5\' 10"  (177.8 cm) Weight: 65 kg BMI (Calculated): 20.56   Intake/Output last 2 shifts:  09/17 0701 - 09/18 0700 In: 5122.6 [I.V.:4268.5; IV Piggyback:854.1] Out: 3715 [Urine:425; Drains:140; Blood:100]   Physical Exam:  Constitutional:  no distress  Respiratory: Apparently ventilating well with good O2 saturations. Cardiovascular: regular rate and sinus rhythm  Gastrointestinal: non-distended, dressing intact Musculoskeletal: no edema Labs:  CBC Latest Ref Rng & Units 01/20/2021 01/22/2021 01/15/2021  WBC 4.0 - 10.5 K/uL 8.7 3.6(L) 19.4(H)  Hemoglobin 12.0 - 15.0 g/dL 01/21/2021 06.2 69.4  Hematocrit 36.0 - 46.0 % 37.2 35.9(L) 40.9  Platelets 150 - 400 K/uL 191 178 220   CMP Latest Ref Rng & Units 01/20/2021 01/12/2021 01/05/2021  Glucose 70 - 99 mg/dL 01/21/2021) 627(O) 350(K)  BUN 8 - 23 mg/dL 938(H) 82(X) 93(Z)  Creatinine 0.44 - 1.00 mg/dL 16(R) 6.78(L) 3.81(O)  Sodium 135 - 145 mmol/L 137 137 138  Potassium 3.5 - 5.1 mmol/L 4.1 3.6 3.8  Chloride 98 - 111 mmol/L 101 101 96(L)  CO2 22 - 32 mmol/L 24 25 25   Calcium 8.9 - 10.3 mg/dL 1.75(Z) 8.3(L) 10.0  Total Protein 6.5 - 8.1 g/dL 6.1(L) - 8.6(H)  Total  Bilirubin 0.3 - 1.2 mg/dL 0.8 - 1.1  Alkaline Phos 38 - 126 U/L 44 - 63  AST 15 - 41 U/L 29 - 33  ALT 0 - 44 U/L 19 - 19     Imaging studies: No new pertinent imaging studies   Assessment/Plan:  87 y.o. female with strangulated recurrent incarcerated right inguinal hernia 1 Day Post-Op s/p small bowel resections x2, open repair of right inguinal hernia for obstruction and volvulus resulting in ischemia, complicated by pertinent comorbidities including : Patient Active Problem List   Diagnosis Date Noted   Endotracheally intubated 01/20/2021   On mechanically assisted ventilation (HCC) 01/20/2021   SVT (supraventricular tachycardia) (HCC) 01/20/2021   SBO (small bowel obstruction) (HCC) 01/26/2021   Incarcerated right inguinal hernia    Malnutrition of moderate degree 07/01/2018   GI bleed 05/26/2015   HTN (hypertension) 05/26/2015   Hypothyroid 05/26/2015   Chronic diastolic CHF (congestive heart failure) (HCC) 05/26/2015   Acute kidney injury superimposed on CKD (HCC) 05/26/2015     -Appreciate ICU team further care.  Anticipating weaning and hopeful extubation today.  -We will continue JP drain for now.  If extubated likely will have to discontinue oral gastric tube.  Due to difficulty placing NG tube hopefully she will tolerate this.  -Strict n.p.o. for now we will await return of bowel function considering there are 2 anastomoses.   -IV antibiotics and fluid support, continue Foley to gravity to monitor  urine output.  All of the above findings and recommendations were discussed with the patient, patient's family, and the medical team, and all of patient's and family's questions were answered to their expressed satisfaction.   -- Campbell Lerner M.D., [facility] 01/20/2021 7:14 AM

## 2021-01-21 ENCOUNTER — Inpatient Hospital Stay: Payer: Medicare Other

## 2021-01-21 ENCOUNTER — Inpatient Hospital Stay: Payer: Self-pay

## 2021-01-21 ENCOUNTER — Encounter: Payer: Self-pay | Admitting: Surgery

## 2021-01-21 ENCOUNTER — Ambulatory Visit: Payer: Federal, State, Local not specified - PPO | Admitting: Surgery

## 2021-01-21 DIAGNOSIS — E43 Unspecified severe protein-calorie malnutrition: Secondary | ICD-10-CM | POA: Insufficient documentation

## 2021-01-21 DIAGNOSIS — K4030 Unilateral inguinal hernia, with obstruction, without gangrene, not specified as recurrent: Secondary | ICD-10-CM | POA: Diagnosis not present

## 2021-01-21 LAB — CBC WITH DIFFERENTIAL/PLATELET
Abs Immature Granulocytes: 0.08 10*3/uL — ABNORMAL HIGH (ref 0.00–0.07)
Basophils Absolute: 0 10*3/uL (ref 0.0–0.1)
Basophils Relative: 0 %
Eosinophils Absolute: 0 10*3/uL (ref 0.0–0.5)
Eosinophils Relative: 0 %
HCT: 29.1 % — ABNORMAL LOW (ref 36.0–46.0)
Hemoglobin: 9.8 g/dL — ABNORMAL LOW (ref 12.0–15.0)
Immature Granulocytes: 1 %
Lymphocytes Relative: 6 %
Lymphs Abs: 0.7 10*3/uL (ref 0.7–4.0)
MCH: 32.5 pg (ref 26.0–34.0)
MCHC: 33.7 g/dL (ref 30.0–36.0)
MCV: 96.4 fL (ref 80.0–100.0)
Monocytes Absolute: 1.1 10*3/uL — ABNORMAL HIGH (ref 0.1–1.0)
Monocytes Relative: 9 %
Neutro Abs: 10.2 10*3/uL — ABNORMAL HIGH (ref 1.7–7.7)
Neutrophils Relative %: 84 %
Platelets: 189 10*3/uL (ref 150–400)
RBC: 3.02 MIL/uL — ABNORMAL LOW (ref 3.87–5.11)
RDW: 13.2 % (ref 11.5–15.5)
Smear Review: UNDETERMINED
WBC: 12.3 10*3/uL — ABNORMAL HIGH (ref 4.0–10.5)
nRBC: 0.2 % (ref 0.0–0.2)

## 2021-01-21 LAB — BASIC METABOLIC PANEL
Anion gap: 10 (ref 5–15)
BUN: 70 mg/dL — ABNORMAL HIGH (ref 8–23)
CO2: 23 mmol/L (ref 22–32)
Calcium: 7.7 mg/dL — ABNORMAL LOW (ref 8.9–10.3)
Chloride: 107 mmol/L (ref 98–111)
Creatinine, Ser: 3.1 mg/dL — ABNORMAL HIGH (ref 0.44–1.00)
GFR, Estimated: 13 mL/min — ABNORMAL LOW (ref >60)
Glucose, Bld: 140 mg/dL — ABNORMAL HIGH (ref 70–99)
Potassium: 3.6 mmol/L (ref 3.5–5.1)
Sodium: 140 mmol/L (ref 135–145)

## 2021-01-21 LAB — GLUCOSE, CAPILLARY
Glucose-Capillary: 114 mg/dL — ABNORMAL HIGH (ref 70–99)
Glucose-Capillary: 119 mg/dL — ABNORMAL HIGH (ref 70–99)
Glucose-Capillary: 120 mg/dL — ABNORMAL HIGH (ref 70–99)
Glucose-Capillary: 129 mg/dL — ABNORMAL HIGH (ref 70–99)
Glucose-Capillary: 132 mg/dL — ABNORMAL HIGH (ref 70–99)
Glucose-Capillary: 182 mg/dL — ABNORMAL HIGH (ref 70–99)

## 2021-01-21 LAB — SODIUM, URINE, RANDOM: Sodium, Ur: 16 mmol/L

## 2021-01-21 LAB — CORTISOL: Cortisol, Plasma: 32.2 ug/dL

## 2021-01-21 LAB — MAGNESIUM: Magnesium: 2.1 mg/dL (ref 1.7–2.4)

## 2021-01-21 LAB — THYROID PANEL WITH TSH
Free Thyroxine Index: 2.2 (ref 1.2–4.9)
T3 Uptake Ratio: 37 % (ref 24–39)
T4, Total: 6 ug/dL (ref 4.5–12.0)
TSH: 3.71 u[IU]/mL (ref 0.450–4.500)

## 2021-01-21 LAB — TSH: TSH: 1.821 u[IU]/mL (ref 0.350–4.500)

## 2021-01-21 LAB — CREATININE, URINE, RANDOM: Creatinine, Urine: 84 mg/dL

## 2021-01-21 LAB — BRAIN NATRIURETIC PEPTIDE: B Natriuretic Peptide: 387.5 pg/mL — ABNORMAL HIGH (ref 0.0–100.0)

## 2021-01-21 LAB — PHOSPHORUS: Phosphorus: 4.4 mg/dL (ref 2.5–4.6)

## 2021-01-21 MED ORDER — SODIUM CHLORIDE 0.9 % IV SOLN
2.0000 g | INTRAVENOUS | Status: AC
Start: 2021-01-21 — End: 2021-01-24
  Administered 2021-01-21 – 2021-01-24 (×4): 2 g via INTRAVENOUS
  Filled 2021-01-21 (×2): qty 20
  Filled 2021-01-21: qty 2
  Filled 2021-01-21: qty 20

## 2021-01-21 MED ORDER — METRONIDAZOLE 500 MG/100ML IV SOLN
500.0000 mg | Freq: Three times a day (TID) | INTRAVENOUS | Status: DC
  Filled 2021-01-21 (×3): qty 100

## 2021-01-21 MED ORDER — DEXTROSE 10 % IV SOLN: INTRAVENOUS | Status: AC

## 2021-01-21 MED ORDER — POTASSIUM CHLORIDE 10 MEQ/50ML IV SOLN
10.0000 meq | INTRAVENOUS | Status: DC
Start: 2021-01-21 — End: 2021-01-21
  Filled 2021-01-21 (×4): qty 50

## 2021-01-21 MED ORDER — FENTANYL CITRATE (PF) 100 MCG/2ML IJ SOLN
12.5000 ug | INTRAMUSCULAR | Status: DC | PRN
  Administered 2021-01-23 – 2021-01-24 (×2): 12.5 ug via INTRAVENOUS
  Filled 2021-01-21 (×2): qty 2

## 2021-01-21 MED ORDER — SODIUM CHLORIDE 0.9% FLUSH
10.0000 mL | Freq: Two times a day (BID) | INTRAVENOUS | Status: DC
  Administered 2021-01-21 – 2021-01-22 (×2): 10 mL
  Administered 2021-01-22: 30 mL
  Administered 2021-01-23 – 2021-01-24 (×3): 10 mL

## 2021-01-21 MED ORDER — METOPROLOL TARTRATE 5 MG/5ML IV SOLN
5.0000 mg | Freq: Four times a day (QID) | INTRAVENOUS | Status: DC
  Administered 2021-01-21 – 2021-01-23 (×6): 5 mg via INTRAVENOUS
  Filled 2021-01-21 (×6): qty 5

## 2021-01-21 MED ORDER — METRONIDAZOLE 500 MG/100ML IV SOLN
500.0000 mg | Freq: Two times a day (BID) | INTRAVENOUS | Status: DC
Start: 2021-01-21 — End: 2021-01-22
  Administered 2021-01-21 – 2021-01-22 (×3): 500 mg via INTRAVENOUS
  Filled 2021-01-21 (×4): qty 100

## 2021-01-21 MED ORDER — METOPROLOL TARTRATE 5 MG/5ML IV SOLN
2.5000 mg | Freq: Once | INTRAVENOUS | Status: AC
  Administered 2021-01-21: 2.5 mg via INTRAVENOUS
  Filled 2021-01-21: qty 5

## 2021-01-21 MED ORDER — SODIUM CHLORIDE 0.9% FLUSH: 10.0000 mL | INTRAVENOUS | Status: DC | PRN

## 2021-01-21 MED ORDER — DEXTROSE 10 % IV SOLN: INTRAVENOUS | Status: DC | PRN

## 2021-01-21 MED ORDER — TRAVASOL 10 % IV SOLN
INTRAVENOUS | Status: AC
  Filled 2021-01-21: qty 403.2

## 2021-01-21 MED ORDER — INSULIN ASPART 100 UNIT/ML IJ SOLN
0.0000 [IU] | Freq: Three times a day (TID) | INTRAMUSCULAR | Status: DC
  Administered 2021-01-23 (×2): 1 [IU] via SUBCUTANEOUS
  Filled 2021-01-21 (×2): qty 1

## 2021-01-21 NOTE — Plan of Care (Signed)
Patient is extubated to 3 lpm O2 nasal canula 

## 2021-01-21 NOTE — Progress Notes (Signed)
Per Micheline Rough NP, CT is negative and ok to update sister.... Called Acquenetta (sister) and updated on CT results... Answered any questions.... Acquenetta verb understanding.

## 2021-01-21 NOTE — Progress Notes (Signed)
VO ok given to use PICC line by Alexandria Lodge, RN

## 2021-01-21 NOTE — Progress Notes (Addendum)
Initial Nutrition Assessment  DOCUMENTATION CODES:   Severe malnutrition in context of chronic illness  INTERVENTION:   TPN per pharmacy   Recommend thiamine 100mg  daily added to TPN  Pt at high refeed risk; recommend monitor potassium, magnesium and phosphorus labs daily until stable  Daily weights   NUTRITION DIAGNOSIS:   Severe Malnutrition related to chronic illness as evidenced by moderate fat depletion, severe muscle depletion, 12 percent weight loss in 6 months.  GOAL:   Provide needs based on ASPEN/SCCM guidelines  MONITOR:   Vent status, Labs, Weight trends, Skin, I & O's, Other (Comment) (TPN)  REASON FOR ASSESSMENT:   Consult, Ventilator New TPN/TNA  ASSESSMENT:   87 y/o female with h/o HTN, CHF, GERD, gout, depression, diverticulitis, CKD and hypothyroidism who is admitted with incarcerated inguinal hernia now s/p ex lap with small bowel resection and hernia repair 9/17.  Pt sedated and ventilated. OGT in place with 21ml output. NGT placement was attempted without success by surgery. Plan is for possible extubation today. Spoke with MD, plan is for pt to be NPO for 3-4 days. Will plan to start TPN today. Pt is at high refeed risk. Per chart, pt is down 17lbs(12%) over the past 6 months; this is significant weight loss. RD suspects pt with poor oral intake at baseline. Of note, pt wears full dentures.   Medications reviewed and include: pepcid, ceftriaxone, metronidazole, neo-synephrine  Labs reviewed: K 3.6 wnl, BUN 70(H), creat 3.10(H), P 4.4 wnl, Mg 2.1 wnl BNP- 387.5(H) Triglycerides 116- 9/18 Wbc- 12.3(H), Hgb 9.8(H), Hct 29.1(L) Cbgs- 119, 132 x 24 hrs AIC 5.4- 9/17  Patient is currently intubated on ventilator support MV: 8.3 L/min Temp (24hrs), Avg:98.7 F (37.1 C), Min:97.5 F (36.4 C), Max:100.7 F (38.2 C)  Propofol: none   MAP- >21mmHg  UOP- 77m   NUTRITION - FOCUSED PHYSICAL EXAM:  Flowsheet Row Most Recent Value  Orbital  Region Moderate depletion  Upper Arm Region Mild depletion  Thoracic and Lumbar Region Mild depletion  Buccal Region Moderate depletion  Temple Region Severe depletion  Clavicle Bone Region Severe depletion  Clavicle and Acromion Bone Region Severe depletion  Scapular Bone Region Severe depletion  Dorsal Hand Severe depletion  Patellar Region Mild depletion  Anterior Thigh Region Mild depletion  Posterior Calf Region Mild depletion  Edema (RD Assessment) None  Hair Reviewed  Eyes Reviewed  Mouth Reviewed  Skin Reviewed  Nails Reviewed   Diet Order:   Diet Order             Diet NPO time specified  Diet effective now                  EDUCATION NEEDS:   No education needs have been identified at this time  Skin:  Skin Assessment: Reviewed RN Assessment (incision abdomen)  Last BM:  pta  Height:   Ht Readings from Last 1 Encounters:  01/24/2021 5\' 10"  (1.778 m)    Weight:   Wt Readings from Last 1 Encounters:  01/21/21 57.4 kg    Ideal Body Weight:  68 kg  BMI:  Body mass index is 18.16 kg/m.  Estimated Nutritional Needs:   Kcal:  1500-1700kcal/day  Protein:  75-85g/day  Fluid:  1.4-1.7L/day  MS, RD, LDN Please refer to [facility] for RD and/or RD on-call/weekend/after hours pager

## 2021-01-21 NOTE — Progress Notes (Signed)
PHARMACY CONSULT NOTE  Pharmacy Consult for Electrolyte Monitoring and Replacement   Recent Labs: Potassium (mmol/L)  Date Value  01/21/2021 3.6  04/06/2014 3.6   Magnesium (mg/dL)  Date Value  51/06/5850 2.1  04/06/2014 1.9   Calcium (mg/dL)  Date Value  77/82/4235 7.7 (L)   Calcium, Total (mg/dL)  Date Value  36/14/4315 8.1 (L)   Albumin (g/dL)  Date Value  40/12/6759 2.8 (L)  03/29/2014 4.1   Phosphorus (mg/dL)  Date Value  95/01/3266 4.4  04/03/2014 2.5   Sodium (mmol/L)  Date Value  01/21/2021 140  04/05/2014 142    Assessment: 87 year old female presenting at [facility] ED on 01/29/2021 with complaints of severe right lower quadrant pain for the last 2 days.  Of note she had a recent ED visit 2 days ago for a right inguinal hernia that was successfully reduced in the ED. After bedside assessment decision was made to proceed with surgical exploration and repair.  At that time patient denied fevers chills dyspnea or chest pain, and reported no flatus or bowel movement. Patient taken urgently to OR for diagnostic laparoscopy and open repair of recurrent incarcerated right inguinal hernia, laparotomy with resection of small bowel x 2.  The patient was intubated requiring mechanical ventilation for the procedure.   MIVF: D5LR at 100 mL/hr (I&O: + 3.8L)  Scr trending up (1.24 >> 3.1)  Goal of Therapy:  Electrolytes within normal limits  Plan:  --No electrolyte replacement warranted at this time --Follow-up electrolytes with AM labs tomorrow  Tressie Ellis 01/21/2021 7:53 AM

## 2021-01-21 NOTE — Progress Notes (Signed)
NAME:  Vanessa Lang, MRN:  [id number], DOB:  [birth year], LOS: 2 ADMISSION DATE:  2021-02-16, CONSULTATION DATE:  2021-02-16 REFERRING MD:  Claudine Mouton, MD CHIEF COMPLAINT:  Respiratory Failure   History of Present Illness:  87 year old female presenting at [facility] ED on 02-16-2021 with complaints of severe right lower quadrant pain for the last 2 days.  Of note she had a recent ED visit 2 days ago for a right inguinal hernia that was successfully reduced in the ED which improved her symptoms at that time and she was discharged home.     CT abdomen pelvis with contrast from 01/17/2021 >> large right groin hernia containing small bowel loops.  Dilated small bowel in the left abdomen tracks down to the hernia were markedly distended fluid-filled loops of small bowel is identified with additional decompressed small bowel loops tracking back into the peritoneal cavity.  Colon is nondilated imaging features compatible with small bowel obstruction secondary to incarcerated right groin hernia.  Multiple bilateral simple and complex renal cysts containing varying degrees of proteinaceous debris/hemorrhage.  Small volume free fluid in the pelvis, diffuse dilatation of the main pancreatic duct similar to prior. After bedside assessment decision was made to proceed with surgical exploration and repair.  Patient taken urgently to OR for diagnostic laparoscopy and open repair of recurrent incarcerated right inguinal hernia, laparotomy with resection of small bowel x 2.     The patient was intubated requiring mechanical ventilation for the procedure.  During the procedure patient had 3.5 L of NG tube output of dark bilious fluid.  The OR team also reported the patient had 2 runs of SVT which were treated with esmolol and lidocaine during the procedure.  Overall the patient remained hemodynamically stable, and was transferred to ICU still requiring mechanical ventilation.  PCCM consulted for assistance in ventilator management  and monitoring.  Pertinent  Medical History  Hypertension HFpEF Severe aortic stenosis Presence of permanent pacemaker Diverticulitis with GIB Gout Hyperlipidemia Hypothyroidism Inguinal hernia s/p repair and small bowel resection - 2015 Former smoker- quit 1988 Bradycardia CKD Stage 3b Anxiety/Depression  Significant Hospital Events: Including procedures, antibiotic start and stop dates in addition to other pertinent events   2021-02-16 patient taken urgently to the OR for incarcerated right inguinal hernia repair and small bowel resection x 2, transferred to ICU requiring mechanical ventilation support 9/18 remains on vent  Interim History / Subjective:   Tolerating SBT this morning. Somnolent but will follow commands. No acute events overnight. Weaned off neosynephrine this AM.   Objective   Blood pressure 121/63, pulse 60, temperature 98.4 F (36.9 C), temperature source Axillary, resp. rate 15, height 5\' 10"  (1.778 m), weight 57.4 kg, SpO2 99 %.    Vent Mode: PSV FiO2 (%):  [30 %-40 %] 30 % PEEP:  [5 cmH20] 5 cmH20 Pressure Support:  [5 cmH20] 5 cmH20   Intake/Output Summary (Last 24 hours) at 01/21/2021 01/23/2021 Last data filed at 01/21/2021 0600 Gross per 24 hour  Intake 2173.27 ml  Output 555 ml  Net 1618.27 ml   Filed Weights   02/16/21 1159 16-Feb-2021 1531 01/21/21 0421  Weight: 65 kg 65 kg 57.4 kg    Examination: General: elderly woman, thin/cachectic, no acute distress HENT: [place]/AT, PERRL, moist mucous membranes, sclera anicteric Lungs: clear to auscultation bilaterally, no wheezing Cardiovascular: rrr, no murmurs Abdomen: soft, non-distended, incisions - clean/dry/intact Extremities: warm, trace upper extremity edema Neuro: somnolent, arouses to verbal stimuli. Follows commands. GU: foley  Resolved Hospital Problem  list     Assessment & Plan:  Acute Hypoxemic Respiratory Failure In setting of shock due to incarcerated hernia and small bowel  resection - Tolerating SBT this AM and is following commands but somnolent. Will plan to extubate and monitor closely.  Shock Secondary to incarcerated hernia and small bowel resection. Differential also includes cardiogenic and adrenal insufficiency. - Check Echo - Check TSH and random cortisol - Weaned of neo this AM, will continue to monitor  Incarcerated Right Inguinal Hernia s/p laparotomy with small bowel resection x 2, repair of right inguinal hernia for obstruction and volvulus with ischemia.  - general surgery following - remains NPO, will start TPN today - Will transition from zosyn to ceftriaxone + flagyl for intra-abdominal coverage.   Acute Kidney Injury - check urine lytes - monitor UOP - Zosyn stopped 9/19  Encephalopathy In setting of shock - improving, remains somnolent today - avoid sedating agents  Hypoglycemia - transition to D10 drip today, will stop once TPN initiated  Hypothyroidism - resume home synthroid when able   Best Practice (right click and "Reselect all SmartList Selections" daily)   Diet/type: TPN DVT prophylaxis: prophylactic heparin  GI prophylaxis: H2B Lines: Central line Foley:  Yes, and it is still needed Code Status:  full code Last date of multidisciplinary goals of care discussion [N/A]  Labs   CBC: Recent Labs  Lab 01/17/21 0816 01/18/2021 1422 01/24/2021 2309 01/20/21 0358 01/21/21 0343  WBC 13.6* 19.4* 3.6* 8.7 12.3*  NEUTROABS  --   --   --   --  10.2*  HGB 12.9 13.2 12.2 12.2 9.8*  HCT 38.4 40.9 35.9* 37.2 29.1*  MCV 96.2 94.9 95.5 96.6 96.4  PLT 192 220 178 191 189    Basic Metabolic Panel: Recent Labs  Lab 01/17/21 0816 01/18/2021 1422 01/20/2021 2309 01/20/21 0358 01/21/21 0343  NA 136 138 137 137 140  K 3.8 3.8 3.6 4.1 3.6  CL 99 96* 101 101 107  CO2 27 25 25 24 23   GLUCOSE 142* 150* 155* 113* 140*  BUN 28* 64* 63* 65* 70*  CREATININE 1.24* 2.52* 2.45* 2.60* 3.10*  CALCIUM 9.8 10.0 8.3* 8.6* 7.7*  MG   --   --  2.1 2.1 2.1  PHOS  --   --  5.4* 6.3* 4.4   GFR: Estimated Creatinine Clearance: 9.6 mL/min (A) (by C-G formula based on SCr of 3.1 mg/dL (H)). Recent Labs  Lab 01/17/21 1253 01/15/2021 1422 01/29/2021 1603 01/07/2021 2309 01/20/21 0358 01/21/21 0343  WBC  --  19.4*  --  3.6* 8.7 12.3*  LATICACIDVEN 2.2*  --  2.7* 1.0  --   --     Liver Function Tests: Recent Labs  Lab 01/17/21 0816 01/06/2021 1422 01/20/21 0358  AST 25 33 29  ALT 14 19 19   ALKPHOS 68 63 44  BILITOT 0.7 1.1 0.8  PROT 8.1 8.6* 6.1*  ALBUMIN 4.7 4.1 2.8*   Recent Labs  Lab 01/17/21 0816 01/27/2021 1422  LIPASE 29 46   No results for input(s): AMMONIA in the last 168 hours.  ABG    Component Value Date/Time   PHART 7.37 01/20/2021 1303   PCO2ART 33 01/20/2021 1303   PO2ART 135 (H) 01/20/2021 1303   HCO3 19.1 (L) 01/20/2021 1303   ACIDBASEDEF 5.5 (H) 01/20/2021 1303   O2SAT 99.0 01/20/2021 1303     Coagulation Profile: No results for input(s): INR, PROTIME in the last 168 hours.  Cardiac Enzymes: No results for  input(s): CKTOTAL, CKMB, CKMBINDEX, TROPONINI in the last 168 hours.  HbA1C: Hgb A1c MFr Bld  Date/Time Value Ref Range Status  Feb 04, 2021 11:09 PM 5.4 4.8 - 5.6 % Final    Comment:    (NOTE) Pre diabetes:          5.7%-6.4%  Diabetes:              >6.4%  Glycemic control for   <7.0% adults with diabetes     CBG: Recent Labs  Lab 01/20/21 1934 01/20/21 2328 01/20/21 2359 01/21/21 0339 01/21/21 0729  GLUCAP 76 62* 182* 119* 132*    Critical care time: 50 minutes    Melody Comas, MD [facility] Office: [phone]   See Amion for personal pager PCCM on call pager [phone] until 7pm. Please call Elink 7p-7a. [phone]

## 2021-01-21 NOTE — Progress Notes (Signed)
[facility] SURGICAL PROGRESS NOTE  Hospital Day(s): 2.   Post op day(s): 2 Days Post-Op.   Interval History:  Patient seen and examined Patient remains intubated and sedated She is on 25 mcg/min phenylephrine  She did have low grade fever to 100.18F at 1800 yesterday (09/18) Leukocytosis this morning to 12.3K Hgb to 9.8; suspect there is dilutional component to this AKI worsening; sCr - 3.10; UO - 340 ccs No significant electrolyte derangements NGT with 75 ccs out Surgical drain with 140 ccs out; serosanguinous Continues on Zosyn    Vital signs in last 24 hours: [min-max] current  Temp:  [97.5 F (36.4 C)-100.7 F (38.2 C)] 98.4 F (36.9 C) (09/19 0400) Pulse Rate:  [58-76] 59 (09/19 0630) Resp:  [10-35] 35 (09/19 0630) BP: (97-134)/(50-71) 122/61 (09/19 0630) SpO2:  [94 %-100 %] 96 % (09/19 0630) Arterial Line BP: (70-126)/(38-72) 92/42 (09/19 0630) FiO2 (%):  [30 %-40 %] 30 % (09/19 0406) Weight:  [57.4 kg] 57.4 kg (09/19 0421)     Height: 5\' 10"  (177.8 cm) Weight: 57.4 kg BMI (Calculated): 18.16   Intake/Output last 2 shifts:  09/18 0701 - 09/19 0700 In: 2312 [I.V.:2112.2; IV Piggyback:199.8] Out: 555 [Urine:340; Emesis/NG output:75; Drains:140]   Physical Exam:  Constitutional: Intubated, sedated  Respiratory: On ventilator Cardiovascular: regular rate and sinus rhythm  Gastrointestinal: Soft, unable to assess tenderness, non-distended Integumentary: Laparoscopic and laparotomy incisions are CDI with staples and honeycomb, no appreciable erythema   Labs:  CBC Latest Ref Rng & Units 01/21/2021 01/20/2021 January 29, 2021  WBC 4.0 - 10.5 K/uL 12.3(H) 8.7 3.6(L)  Hemoglobin 12.0 - 15.0 g/dL 01/21/2021) 3.7(S 28.3  Hematocrit 36.0 - 46.0 % 29.1(L) 37.2 35.9(L)  Platelets 150 - 400 K/uL 189 191 178   CMP Latest Ref Rng & Units 01/21/2021 01/20/2021 Jan 29, 2021  Glucose 70 - 99 mg/dL 01/21/2021) 761(Y) 073(X)  BUN 8 - 23 mg/dL 106(Y) 69(S) 85(I)  Creatinine 0.44 - 1.00  mg/dL 62(V) 0.35(K) 0.93(G)  Sodium 135 - 145 mmol/L 140 137 137  Potassium 3.5 - 5.1 mmol/L 3.6 4.1 3.6  Chloride 98 - 111 mmol/L 107 101 101  CO2 22 - 32 mmol/L 23 24 25   Calcium 8.9 - 10.3 mg/dL 7.7(L) 8.6(L) 8.3(L)  Total Protein 6.5 - 8.1 g/dL - 6.1(L) -  Total Bilirubin 0.3 - 1.2 mg/dL - 0.8 -  Alkaline Phos 38 - 126 U/L - 44 -  AST 15 - 41 U/L - 29 -  ALT 0 - 44 U/L - 19 -    Imaging studies: No new pertinent imaging studies   Assessment/Plan:  87 y.o. critically ill female requiring vasopressor and ventilator support  2 Days Post-Op s/p exploratory laparotomy, small bowel resections x2, open repair of right inguinal hernia for obstruction and volvulus resulting in ischemia   - Continue NPO. She may need TPN. Given her two small bowel anastomoses, we will hold on any enteral feedings for some time.   - Continue IV Abx (Zosyn)   - Appreciate PCCM assistance with vasopressors and ventilator support - Monitor abdominal examination; on-going bowel function  - Monitor leukocytosis; slightly worse  - Monitor renal function; worsening; continue foley; may need to involve nephrology  All of the above findings and recommendations were discussed with the medical team.  -- , PA-C [facility] 01/21/2021, 7:48 AM [phone] M-F: 7am - 4pm

## 2021-01-21 NOTE — Consult Note (Signed)
PHARMACY - TOTAL PARENTERAL NUTRITION CONSULT NOTE   Indication:  Bowel resection  Patient Measurements: Height: 5\' 10"  (177.8 cm) Weight: 57.4 kg (126 lb 8.7 oz) IBW/kg (Calculated) : 68.5 TPN AdjBW (KG): 65 Body mass index is 18.16 kg/m.  Assessment:  Patient is a 87 y/o F with medical history including CHF, diverticulitis, GERD, gout, HTN, HLD, hypothyroidism, bowel obstruction who is admitted with SBO secondary to incarcerated right inguinal hernia. Patient went for hernia repair and laparotomy with resection of small bowel x 2 on 9/17. Pharmacy consulted to initiate TPN for indication bowel resection.   Glucose / Insulin: BG < 180. Some hypoglycemia yesterday evening Electrolytes: Within normal limits Renal: Scr worsening daily Hepatic: LFTs within normal limits Intake / Output; MIVF: D10w at 50 mL/hr. I&O: + 3.7L GI Imaging: 9/15 CT abdomen / pelvis: "Imaging features compatible with small bowel obstruction secondary to incarcerated right groin hernia." GI Surgeries / Procedures:  9/17: ExLap with small bowel resection x 2, inguinal hernia repair for obstruction and volvulus resulting in ischemia  Central access: Pending 9/19 TPN start date: 9/19 upon appropriate access  Nutritional Goals: Goal TPN rate is 60 mL/hr (provides 80.6 g of protein and 1533 kcals per day)  RD Assessment: Estimated Needs Total Energy Estimated Needs: 1500-1700kcal/day Total Protein Estimated Needs: 75-85g/day Total Fluid Estimated Needs: 1.4-1.7L/day  Current Nutrition:  NPO  Plan:  --Start TPN at 30 mL/hr (1/2 rate) at 1800 Dextrose: 108 g Amino acids: 40.32 g Lipids: 23.8 g Fluids: 720 mL --Electrolytes in TPN: Na 54mEq/L, K 46mEq/L, Ca 81mEq/L, Mg 22mEq/L, and Phos 85mmol/L. Cl:Ac 1:1 --Add standard MVI and trace elements to TPN Add thiamine 100 mg daily x 3 days --Initiate Sensitive q8h SSI and adjust as needed  --Stop MIVF at 1800 --Monitor TPN labs on Mon/Thurs, daily until  stable  9m 01/21/2021,10:54 AM

## 2021-01-21 NOTE — Progress Notes (Signed)
Peripherally Inserted Central Catheter Placement  The IV Nurse has discussed with the patient and/or persons authorized to consent for the patient, the purpose of this procedure and the potential benefits and risks involved with this procedure.  The benefits include less needle sticks, lab draws from the catheter, and the patient may be discharged home with the catheter. Risks include, but not limited to, infection, bleeding, blood clot (thrombus formation), and puncture of an artery; nerve damage and irregular heartbeat and possibility to perform a PICC exchange if needed/ordered by physician.  Alternatives to this procedure were also discussed.  Bard Power PICC patient education guide, fact sheet on infection prevention and patient information card has been provided to patient /or left at bedside.    PICC Placement Documentation  PICC Triple Lumen 01/21/21 PICC Right Brachial 36 cm 0 cm (Active)  Indication for Insertion or Continuance of Line Administration of hyperosmolar/irritating solutions (i.e. TPN, Vancomycin, etc.) 01/21/21 1731  Exposed Catheter (cm) 0 cm 01/21/21 1731  Site Assessment Clean;Dry;Intact 01/21/21 1731  Lumen #1 Status Flushed;Blood return noted;Saline locked 01/21/21 1731  Lumen #2 Status Flushed;Blood return noted;Saline locked 01/21/21 1731  Lumen #3 Status Flushed;Blood return noted;Saline locked 01/21/21 1731  Dressing Type Transparent 01/21/21 1731  Dressing Status Clean;Intact;Dry 01/21/21 1731  Antimicrobial disc in place? Yes 01/21/21 1731  Dressing Change Due 01/28/21 01/21/21 1731       Audrie Gallus 01/21/2021, 5:37 PM

## 2021-01-22 ENCOUNTER — Inpatient Hospital Stay
Admit: 2021-01-22 | Discharge: 2021-01-22 | Disposition: A | Discharge status: Home / Self Care | Payer: Medicare Other | Attending: Pulmonary Disease | Admitting: Pulmonary Disease

## 2021-01-22 DIAGNOSIS — Z515 Encounter for palliative care: Secondary | ICD-10-CM | POA: Diagnosis not present

## 2021-01-22 DIAGNOSIS — Z7189 Other specified counseling: Secondary | ICD-10-CM | POA: Diagnosis not present

## 2021-01-22 DIAGNOSIS — K4030 Unilateral inguinal hernia, with obstruction, without gangrene, not specified as recurrent: Secondary | ICD-10-CM | POA: Diagnosis not present

## 2021-01-22 LAB — GLUCOSE, CAPILLARY
Glucose-Capillary: 125 mg/dL — ABNORMAL HIGH (ref 70–99)
Glucose-Capillary: 70 mg/dL (ref 70–99)
Glucose-Capillary: 126 mg/dL — ABNORMAL HIGH (ref 70–99)
Glucose-Capillary: 107 mg/dL — ABNORMAL HIGH (ref 70–99)
Glucose-Capillary: 124 mg/dL — ABNORMAL HIGH (ref 70–99)
Glucose-Capillary: 132 mg/dL — ABNORMAL HIGH (ref 70–99)

## 2021-01-22 LAB — PHOSPHORUS: Phosphorus: 3.1 mg/dL (ref 2.5–4.6)

## 2021-01-22 LAB — COMPREHENSIVE METABOLIC PANEL
ALT: 12 U/L (ref 0–44)
AST: 31 U/L (ref 15–41)
Albumin: 2.3 g/dL — ABNORMAL LOW (ref 3.5–5.0)
Alkaline Phosphatase: 56 U/L (ref 38–126)
Anion gap: 10 (ref 5–15)
BUN: 67 mg/dL — ABNORMAL HIGH (ref 8–23)
CO2: 26 mmol/L (ref 22–32)
Calcium: 7.9 mg/dL — ABNORMAL LOW (ref 8.9–10.3)
Chloride: 106 mmol/L (ref 98–111)
Creatinine, Ser: 2.64 mg/dL — ABNORMAL HIGH (ref 0.44–1.00)
GFR, Estimated: 16 mL/min — ABNORMAL LOW (ref >60)
Glucose, Bld: 138 mg/dL — ABNORMAL HIGH (ref 70–99)
Potassium: 3.3 mmol/L — ABNORMAL LOW (ref 3.5–5.1)
Sodium: 142 mmol/L (ref 135–145)
Total Bilirubin: 0.7 mg/dL (ref 0.3–1.2)
Total Protein: 6 g/dL — ABNORMAL LOW (ref 6.5–8.1)

## 2021-01-22 LAB — ECHOCARDIOGRAM LIMITED
AR max vel: 0.8 cm2
AV Area VTI: 0.92 cm2
AV Area mean vel: 0.77 cm2
AV Mean grad: 41.5 mmHg
AV Peak grad: 68.2 mmHg
Ao pk vel: 4.13 m/s
Area-P 1/2: 2.18 cm2
Height: 70 in
MV VTI: 1.68 cm2
S' Lateral: 3 cm
Weight: 2239.87 oz

## 2021-01-22 LAB — CULTURE, BLOOD (ROUTINE X 2)
Culture: NO GROWTH
Culture: NO GROWTH

## 2021-01-22 LAB — CBC WITH DIFFERENTIAL/PLATELET
Abs Immature Granulocytes: 0.25 10*3/uL — ABNORMAL HIGH (ref 0.00–0.07)
Basophils Absolute: 0 10*3/uL (ref 0.0–0.1)
Basophils Relative: 0 %
Eosinophils Absolute: 0.1 10*3/uL (ref 0.0–0.5)
Eosinophils Relative: 1 %
HCT: 29.8 % — ABNORMAL LOW (ref 36.0–46.0)
Hemoglobin: 9.9 g/dL — ABNORMAL LOW (ref 12.0–15.0)
Immature Granulocytes: 2 %
Lymphocytes Relative: 6 %
Lymphs Abs: 0.7 10*3/uL (ref 0.7–4.0)
MCH: 32.5 pg (ref 26.0–34.0)
MCHC: 33.2 g/dL (ref 30.0–36.0)
MCV: 97.7 fL (ref 80.0–100.0)
Monocytes Absolute: 1 10*3/uL (ref 0.1–1.0)
Monocytes Relative: 9 %
Neutro Abs: 8.4 10*3/uL — ABNORMAL HIGH (ref 1.7–7.7)
Neutrophils Relative %: 82 %
Platelets: 182 10*3/uL (ref 150–400)
RBC: 3.05 MIL/uL — ABNORMAL LOW (ref 3.87–5.11)
RDW: 13.2 % (ref 11.5–15.5)
Smear Review: NORMAL
WBC: 10.4 10*3/uL (ref 4.0–10.5)
nRBC: 0.3 % — ABNORMAL HIGH (ref 0.0–0.2)

## 2021-01-22 LAB — MAGNESIUM: Magnesium: 2.2 mg/dL (ref 1.7–2.4)

## 2021-01-22 LAB — SURGICAL PATHOLOGY

## 2021-01-22 LAB — UREA NITROGEN, URINE: Urea Nitrogen, Ur: 629 mg/dL

## 2021-01-22 LAB — TRIGLYCERIDES: Triglycerides: 153 mg/dL — ABNORMAL HIGH (ref <150)

## 2021-01-22 MED ORDER — METRONIDAZOLE 500 MG/100ML IV SOLN
500.0000 mg | Freq: Two times a day (BID) | INTRAVENOUS | Status: AC
  Administered 2021-01-22 – 2021-01-24 (×5): 500 mg via INTRAVENOUS
  Filled 2021-01-22 (×5): qty 100

## 2021-01-22 MED ORDER — HEPARIN SODIUM (PORCINE) 5000 UNIT/ML IJ SOLN
5000.0000 [IU] | Freq: Three times a day (TID) | INTRAMUSCULAR | Status: DC
  Administered 2021-01-22 – 2021-01-25 (×9): 5000 [IU] via SUBCUTANEOUS
  Filled 2021-01-22 (×9): qty 1

## 2021-01-22 MED ORDER — POTASSIUM CHLORIDE 10 MEQ/100ML IV SOLN
10.0000 meq | INTRAVENOUS | Status: AC
  Administered 2021-01-22 (×2): 10 meq via INTRAVENOUS
  Filled 2021-01-22 (×2): qty 100

## 2021-01-22 MED ORDER — TRAVASOL 10 % IV SOLN
INTRAVENOUS | Status: AC
  Filled 2021-01-22: qty 806.4

## 2021-01-22 NOTE — Consult Note (Signed)
PHARMACY - TOTAL PARENTERAL NUTRITION CONSULT NOTE   Indication:  Bowel resection  Patient Measurements: Height: 5\' 10"  (177.8 cm) Weight: 63.5 kg (139 lb 15.9 oz) IBW/kg (Calculated) : 68.5 TPN AdjBW (KG): 65 Body mass index is 20.09 kg/m.  Assessment:  Patient is a 87 y/o F with medical history including CHF, diverticulitis, GERD, gout, HTN, HLD, hypothyroidism, bowel obstruction who is admitted with SBO secondary to incarcerated right inguinal hernia. Patient went for hernia repair and laparotomy with resection of small bowel x 2 on 9/17. Pharmacy consulted to initiate TPN for indication bowel resection.   Glucose / Insulin: BG < 180. One lower reading of 70 this AM Electrolytes: Mild hypokalemia Renal: Scr improved today. Good UOP of about 1L yesterday Hepatic: LFTs within normal limits Intake / Output; MIVF: I&O: + 4L GI Imaging: 9/15 CT abdomen / pelvis: "Imaging features compatible with small bowel obstruction secondary to incarcerated right groin hernia." GI Surgeries / Procedures:  9/17: ExLap with small bowel resection x 2, inguinal hernia repair for obstruction and volvulus resulting in ischemia  Central access: PICC triple lumen 9/19 TPN start date: 9/19  Nutritional Goals: Goal TPN rate is 60 mL/hr (provides 80.6 g of protein and 1533 kcals per day)  RD Assessment: Estimated Needs Total Energy Estimated Needs: 1500-1700kcal/day Total Protein Estimated Needs: 75-85g/day Total Fluid Estimated Needs: 1.4-1.7L/day  Current Nutrition:  NPO  Plan:  --Advance TPN to 60 mL/hr (full rate) at 1800 Dextrose: 230.4 g Amino acids: 80.6 g Lipids: 43.2 g (TG 116 >> 153) Fluids: 1440 mL --Electrolytes in TPN: Na 77mEq/L, K 22mEq/L, Ca 36mEq/L, Mg 41mEq/L, and Phos 11mmol/L. Cl:Ac 1:1 --Add standard MVI and trace elements to TPN Add thiamine 100 mg daily x 3 days (day # 2) K 3.3, IV Kcl 10 mEq x 2 runs Holding chromium for now. Can add this back if renal function continues  to improve --Initiate Sensitive q8h SSI and adjust as needed. Has not required. Can be discontinued if tolerates TPN at goal x 48 hours with no requirement --No MIVF at this time --Monitor TPN labs on Mon/Thurs, daily until stable  12m 01/22/2021,8:36 AM

## 2021-01-22 NOTE — Consult Note (Signed)
Consultation Note Date: 01/22/2021   Patient Name: Vanessa Lang  DOB: December 18, 1922  MRN: [id number]  Age / Sex: 87 y.o., female  PCP: Juluis Pitch, MD Referring Physician: Ronny Bacon, MD  Reason for Consultation: Establishing goals of care  HPI/Patient Profile: 87 y.o. female  with past medical history of diastolic heart failure, s/p dual chamber pacemaker for sick sinus syndrome, severe aortic stenosis, hyperlipidemia admitted on 01/26/2021 with small bowel obstruction secondary to incarcerated right inguinal hernia.   Clinical Assessment and Goals of Care: I met today at Vanessa Lang's bedside but no family present. Vanessa Lang is resting but awakens to speak with me. She is awake and alert although very weak and frail. She is unable to tell me about her medical issues specifically but does know she is in the hospital and has had surgery. She knows the year. She denies pain and her only complaint is feeling very weak. She confirms trouble with weight loss at home and says that nothing really tastes good to her. She is hopeful for slow recovery of her surgery. I did ask her about her desire for resuscitation, CPR, and life support/breathing machines and she shakes her head no and confirms this is not anything she would ever want. I asked if she has expressed this to her sister, Vanessa Lang, and she tells me that she has not. She gives me permission to call Vanessa Lang and update her.   I called Vanessa Lang but unsuccessful. I left voicemail but no return call. I attempted two more times and then reached out to her secondary HCPOA, Vanessa Lang. Rush Landmark shares that Vanessa Lang has been present in hospital today to visit as he saw her earlier. I asked him about being secondary HCPOA and he confirms. I share with him Vanessa Lang's conversation with me and that she expressed desire for DNR but he has never had those discussions  with her. He reports that he would not be surprised by this desire but would look to Vanessa Lang to see if this has been discussed previously. I attempted to call her again and no answer. I will try again tomorrow.    Primary Decision Maker PATIENT with support of her sister/HCPOA, Vanessa Lang    SUMMARY OF RECOMMENDATIONS   - Patient indicated desire for DNR status.  - I have made attempts to verify with HCPOA but unable to reach. I will try again tomorrow.   Code Status/Advance Care Planning: DNR   Symptom Management:  Per surgery and PCCM.   Palliative Prophylaxis:  Bowel Regimen, Delirium Protocol, Frequent Pain Assessment, Oral Care, and Turn Reposition  Prognosis:  Overall prognosis poor with advanced age, acute illness, and general decline and weight loss over past months.   Discharge Planning: To Be Determined      Primary Diagnoses: Present on Admission:  SBO (small bowel obstruction) (HCC)  Hypothyroid  Incarcerated right inguinal hernia   I have reviewed the medical record, interviewed the patient and family, and examined the patient. The following aspects are pertinent.  Past Medical  History:  Diagnosis Date   Anginal pain ([place]) 09/06/2019   last episode 2 months ago   Arthritis    Bowel obstruction (HCC)    CHF (congestive heart failure) (HCC)    Depression    Diverticulitis    Dyspnea    GERD (gastroesophageal reflux disease)    Gout    Gout    Heart murmur    Heart valve disorder    HLD (hyperlipidemia)    HTN (hypertension)    Hypothyroidism    Neuropathy    Presence of permanent cardiac pacemaker 1/26/201   [employer] [id number]   Wears dentures    partial upper, full lower   Social History   Socioeconomic History   Marital status: Widowed    Spouse name: Not on file   Number of children: Not on file   Years of education: Not on file   Highest education level: Not on file  Occupational History   Not on file  Tobacco Use   Smoking status:  Former    Types: Cigarettes    Quit date: 74    Years since quitting: 34.7   Smokeless tobacco: Never  Vaping Use   Vaping Use: Never used  Substance and Sexual Activity   Alcohol use: No    Alcohol/week: 0.0 standard drinks   Drug use: No   Sexual activity: Not on file  Other Topics Concern   Not on file  Social History Narrative   Not on file   Social Determinants of Health   Financial Resource Strain: Not on file  Food Insecurity: Not on file  Transportation Needs: Not on file  Physical Activity: Not on file  Stress: Not on file  Social Connections: Not on file   Family History  Problem Relation Age of Onset   Thyroid disease Mother    Hypertension Sister    Thyroid disease Sister    Heart attack Brother    Cancer Brother    Colon cancer Sister    Scheduled Meds:  chlorhexidine gluconate (MEDLINE KIT)  15 mL Mouth Rinse BID   Chlorhexidine Gluconate Cloth  6 each Topical Daily   famotidine (PEPCID) IV  20 mg Intravenous Q24H   heparin  5,000 Units Subcutaneous Q8H   insulin aspart  0-6 Units Subcutaneous Q8H   metoprolol tartrate  5 mg Intravenous Q6H   sodium chloride flush  10-40 mL Intracatheter Q12H   Continuous Infusions:  sodium chloride Stopped (01/22/21 0514)   cefTRIAXone (ROCEPHIN)  IV Stopped (01/21/21 1011)   dextrose     metronidazole Stopped (01/22/21 0104)   TPN ADULT (ION) 30 mL/hr at 01/22/21 0900   TPN ADULT (ION)     PRN Meds:.acetaminophen, albuterol, dextrose, fentaNYL (SUBLIMAZE) injection, sodium chloride flush Allergies  Allergen Reactions   Propranolol Rash   Shellfish Allergy     gout   Review of Systems  Constitutional:  Positive for activity change, appetite change and fatigue.  Neurological:  Positive for weakness.   Physical Exam Vitals and nursing note reviewed.  Constitutional:      General: She is not in acute distress.    Appearance: She is ill-appearing.     Comments: Elderly, frail   Cardiovascular:      Rate and Rhythm: Tachycardia present.  Pulmonary:     Effort: No tachypnea, accessory muscle usage or respiratory distress.  Abdominal:     Palpations: Abdomen is soft.     Tenderness: There is abdominal tenderness.  Neurological:  Mental Status: She is easily aroused.     Comments: Mostly oriented but some forgetfulness regarding her condition    Vital Signs: BP 128/67   Pulse (!) 59   Temp 98.5 F (36.9 C) (Oral)   Resp 20   Ht $R'5\' 10"'HT$  (1.778 m)   Wt 63.5 kg   SpO2 100%   BMI 20.09 kg/m     Pain Score: 0-No pain   SpO2: SpO2: 100 % O2 Device:SpO2: 100 % O2 Flow Rate: .O2 Flow Rate (L/min): 4 L/min  IO: Intake/output summary:  Intake/Output Summary (Last 24 hours) at 01/22/2021 0952 Last data filed at 01/22/2021 0900 Gross per 24 hour  Intake 1433.13 ml  Output 1150 ml  Net 283.13 ml    LBM:   Baseline Weight: Weight: 65 kg Most recent weight: Weight: 63.5 kg     Palliative Assessment/Data:    Time Total: 50 min  Greater than 50%  of this time was spent counseling and coordinating care related to the above assessment and plan.  Signed by: Vinie Sill, NP Palliative Medicine Team Pager # [phone] (M-F 8a-5p) Team Phone # [phone] (Nights/Weekends)

## 2021-01-22 NOTE — Progress Notes (Signed)
*  PRELIMINARY RESULTS* Echocardiogram 2D Echocardiogram has been performed.  Cristela Blue 01/22/2021, 12:20 PM

## 2021-01-22 NOTE — Progress Notes (Signed)
Nutrition Follow Up Note   DOCUMENTATION CODES:   Severe malnutrition in context of chronic illness  INTERVENTION:   Continue TPN per pharmacy- advancing to goal rate today   Thiamine 100mg  daily added to TPN  Daily weights   NUTRITION DIAGNOSIS:   Severe Malnutrition related to chronic illness as evidenced by moderate fat depletion, severe muscle depletion, 12 percent weight loss in 6 months.  GOAL:   Patient will meet greater than or equal to 90% of their needs -progressing with TPN   MONITOR:   Diet advancement, Labs, Weight trends, Skin, I & O's, Other (Comment) (TPN)  ASSESSMENT:   87 y/o female with h/o HTN, CHF, GERD, gout, depression, diverticulitis, CKD and hypothyroidism who is admitted with incarcerated inguinal hernia now s/p ex lap with small bowel resection and hernia repair 9/17.  Pt extubated yesterday. TPN initiated; plan is to advance to goal rate today. Refeed labs stable. Monitor daily weights.   Medications reviewed and include: pepcid, heparin, insulin, ceftriaxone, metronidazole  Labs reviewed: K 3.3(L), BUN 67(H), creat 2.64(H), P 3.1 wnl, Mg 2.2 wnl Triglycerides 116- 9/18 Hgb 9.9(H), Hct 29.8(L) Cbgs- 125, 70 x 24 hrs  Diet Order:   Diet Order             Diet NPO time specified  Diet effective now                  EDUCATION NEEDS:   No education needs have been identified at this time  Skin:  Skin Assessment: Reviewed RN Assessment (incision abdomen)  Last BM:  pta  Height:   Ht Readings from Last 1 Encounters:  01/29/2021 5\' 10"  (1.778 m)    Weight:   Wt Readings from Last 1 Encounters:  01/22/21 63.5 kg    Ideal Body Weight:  68 kg  BMI:  Body mass index is 20.09 kg/m.  Estimated Nutritional Needs:   Kcal:  1500-1700kcal/day  Protein:  75-85g/day  Fluid:  1.4-1.7L/day  MS, RD, LDN Please refer to [facility] for RD and/or RD on-call/weekend/after hours pager

## 2021-01-22 NOTE — Progress Notes (Signed)
NAME:  Mekaela Azizi, MRN:  [id number], DOB:  08-09-1922, LOS: 3 ADMISSION DATE:  01-24-2021, CONSULTATION DATE:  2021-01-24 REFERRING MD:  Claudine Mouton, MD CHIEF COMPLAINT:  Respiratory Failure   History of Present Illness:  87 year old female presenting at [facility] ED on 2021/01/24 with complaints of severe right lower quadrant pain for the last 2 days.  Of note she had a recent ED visit 2 days ago for a right inguinal hernia that was successfully reduced in the ED which improved her symptoms at that time and she was discharged home.     CT abdomen pelvis with contrast from 01/17/2021 >> large right groin hernia containing small bowel loops.  Dilated small bowel in the left abdomen tracks down to the hernia were markedly distended fluid-filled loops of small bowel is identified with additional decompressed small bowel loops tracking back into the peritoneal cavity.  Colon is nondilated imaging features compatible with small bowel obstruction secondary to incarcerated right groin hernia.  Multiple bilateral simple and complex renal cysts containing varying degrees of proteinaceous debris/hemorrhage.  Small volume free fluid in the pelvis, diffuse dilatation of the main pancreatic duct similar to prior. After bedside assessment decision was made to proceed with surgical exploration and repair.  Patient taken urgently to OR for diagnostic laparoscopy and open repair of recurrent incarcerated right inguinal hernia, laparotomy with resection of small bowel x 2.     The patient was intubated requiring mechanical ventilation for the procedure.  During the procedure patient had 3.5 L of NG tube output of dark bilious fluid.  The OR team also reported the patient had 2 runs of SVT which were treated with esmolol and lidocaine during the procedure.  Overall the patient remained hemodynamically stable, and was transferred to ICU still requiring mechanical ventilation.  PCCM consulted for assistance in ventilator management  and monitoring.  Pertinent  Medical History  Hypertension HFpEF Severe aortic stenosis Presence of permanent pacemaker Diverticulitis with GIB Gout Hyperlipidemia Hypothyroidism Inguinal hernia s/p repair and small bowel resection - 2015 Former smoker- quit 1988 Bradycardia CKD Stage 3b Anxiety/Depression  Significant Hospital Events: Including procedures, antibiotic start and stop dates in addition to other pertinent events   Jan 24, 2021 patient taken urgently to the OR for incarcerated right inguinal hernia repair and small bowel resection x 2, transferred to ICU requiring mechanical ventilation support 9/18 remains on vent 9/19 extubated and off pressors  Interim History / Subjective:   No acute events after extubation yesterday. TPN was started yesterday. Nursing reports increase edema of right upper extremity.  Objective   Blood pressure 126/63, pulse (!) 59, temperature 98 F (36.7 C), temperature source Oral, resp. rate 15, height 5\' 10"  (1.778 m), weight 63.5 kg, SpO2 99 %.    Vent Mode: PSV FiO2 (%):  [30 %] 30 % PEEP:  [5 cmH20] 5 cmH20 Pressure Support:  [5 cmH20] 5 cmH20   Intake/Output Summary (Last 24 hours) at 01/22/2021 0826 Last data filed at 01/22/2021 0600 Gross per 24 hour  Intake 1343.13 ml  Output 1025 ml  Net 318.13 ml   Filed Weights   01/24/2021 1531 01/21/21 0421 01/22/21 0600  Weight: 65 kg 57.4 kg 63.5 kg    Examination: General: elderly woman, thin/cachectic, no acute distress HENT: [place]/AT, PERRL, moist mucous membranes, sclera anicteric Lungs: clear to auscultation bilaterally, no wheezing Cardiovascular: rrr, no murmurs Abdomen: soft, non-distended, incisions - clean/dry/intact Extremities: warm, edema present of RUE Neuro: alert and oriented. Follows commands. GU: foley  Resolved  Hospital Problem list     Assessment & Plan:  Acute Hypoxemic Respiratory Failure In setting of shock due to incarcerated hernia and small bowel  resection - Extubated 9/19 - Continue supplemental O2 to maintain SpO2 92% or greater  Shock - resolved Secondary to incarcerated hernia and small bowel resection. Differential also includes cardiogenic and adrenal insufficiency. - Echo pending - TSH and random cortisol within normal limits - Weaned of neo 9/19  Incarcerated Right Inguinal Hernia s/p laparotomy with small bowel resection x 2, repair of right inguinal hernia for obstruction and volvulus with ischemia.  - general surgery following - remains NPO, TPN started - Will transition from zosyn to ceftriaxone + flagyl for intra-abdominal coverage for total of 5 day course  Acute Kidney Injury - Cr improved this morning - urine lytes concerning for pre-renal state - monitor UOP - Zosyn stopped 9/19  Encephalopathy In setting of shock, seems to be improving but patient remains somnolent - CT head 9/19 negative - avoid sedating agents  Hypoglycemia -  - transition to D10 drip if needed, TPN initiated 9/19  Hypothyroidism - resume home synthroid when taking PO   Best Practice (right click and "Reselect all SmartList Selections" daily)   Diet/type: TPN DVT prophylaxis: prophylactic heparin  GI prophylaxis: H2B Lines: Central line Foley:  Yes, and it is still needed Code Status:  full code Last date of multidisciplinary goals of care discussion [N/A]  Labs   CBC: Recent Labs  Lab 01/29/2021 1422 01/22/2021 2309 01/20/21 0358 01/21/21 0343 01/22/21 0204  WBC 19.4* 3.6* 8.7 12.3* 10.4  NEUTROABS  --   --   --  10.2* 8.4*  HGB 13.2 12.2 12.2 9.8* 9.9*  HCT 40.9 35.9* 37.2 29.1* 29.8*  MCV 94.9 95.5 96.6 96.4 97.7  PLT 220 178 191 189 182    Basic Metabolic Panel: Recent Labs  Lab 01/24/2021 1422 01/21/2021 2309 01/20/21 0358 01/21/21 0343 01/22/21 0204  NA 138 137 137 140 142  K 3.8 3.6 4.1 3.6 3.3*  CL 96* 101 101 107 106  CO2 25 25 24 23 26   GLUCOSE 150* 155* 113* 140* 138*  BUN 64* 63* 65* 70* 67*   CREATININE 2.52* 2.45* 2.60* 3.10* 2.64*  CALCIUM 10.0 8.3* 8.6* 7.7* 7.9*  MG  --  2.1 2.1 2.1 2.2  PHOS  --  5.4* 6.3* 4.4 3.1   GFR: Estimated Creatinine Clearance: 12.5 mL/min (A) (by C-G formula based on SCr of 2.64 mg/dL (H)). Recent Labs  Lab 01/17/21 1253 01/11/2021 1422 01/24/2021 1603 01/20/2021 2309 01/20/21 0358 01/21/21 0343 01/22/21 0204  WBC  --    < >  --  3.6* 8.7 12.3* 10.4  LATICACIDVEN 2.2*  --  2.7* 1.0  --   --   --    < > = values in this interval not displayed.    Liver Function Tests: Recent Labs  Lab 01/17/21 0816 01/23/2021 1422 01/20/21 0358 01/22/21 0204  AST 25 33 29 31  ALT 14 19 19 12   ALKPHOS 68 63 44 56  BILITOT 0.7 1.1 0.8 0.7  PROT 8.1 8.6* 6.1* 6.0*  ALBUMIN 4.7 4.1 2.8* 2.3*   Recent Labs  Lab 01/17/21 0816 01/06/2021 1422  LIPASE 29 46   No results for input(s): AMMONIA in the last 168 hours.  ABG    Component Value Date/Time   PHART 7.37 01/20/2021 1303   PCO2ART 33 01/20/2021 1303   PO2ART 135 (H) 01/20/2021 1303   HCO3 19.1 (  L) 01/20/2021 1303   ACIDBASEDEF 5.5 (H) 01/20/2021 1303   O2SAT 99.0 01/20/2021 1303     Coagulation Profile: No results for input(s): INR, PROTIME in the last 168 hours.  Cardiac Enzymes: No results for input(s): CKTOTAL, CKMB, CKMBINDEX, TROPONINI in the last 168 hours.  HbA1C: Hgb A1c MFr Bld  Date/Time Value Ref Range Status  01/15/2021 11:09 PM 5.4 4.8 - 5.6 % Final    Comment:    (NOTE) Pre diabetes:          5.7%-6.4%  Diabetes:              >6.4%  Glycemic control for   <7.0% adults with diabetes     CBG: Recent Labs  Lab 01/21/21 1125 01/21/21 1545 01/21/21 2344 01/22/21 0340 01/22/21 0720  GLUCAP 120* 129* 114* 125* 70    Critical care time:  n/a    Melody Comas, MD [facility] Office: [phone]   See Amion for personal pager PCCM on call pager [phone] until 7pm. Please call Elink 7p-7a. [phone]

## 2021-01-22 NOTE — Progress Notes (Signed)
[facility] SURGICAL PROGRESS NOTE  Hospital Day(s): 3.   Post op day(s): 3 Days Post-Op.   Interval History:  Patient seen and examined Extubated yesterday (09/19) afternoon Patient opens eyes; able to participate Abdominal soreness, no fever, nausea, emesis Leukocytosis seen yesterday is now resolved; 10.4K Hgb stable at 9.9; no evidence of bleeding Renal function improved; sCr - 2.64; UO - 1.0L Mild hypokalemia to 3.3 Surgical drain output not recorded; serous  She had PICC placed yesterday for initiation of TPN   Vital signs in last 24 hours: [min-max] current  Temp:  [98 F (36.7 C)-98.9 F (37.2 C)] 98 F (36.7 C) (09/20 0400) Pulse Rate:  [52-113] 59 (09/20 0600) Resp:  [14-26] 15 (09/20 0600) BP: (94-137)/(53-88) 126/63 (09/20 0600) SpO2:  [94 %-100 %] 99 % (09/20 0600) Arterial Line BP: (78-164)/(44-137) 130/125 (09/20 0600) FiO2 (%):  [30 %] 30 % (09/19 1102) Weight:  [63.5 kg] 63.5 kg (09/20 0600)     Height: 5\' 10"  (177.8 cm) Weight: 63.5 kg BMI (Calculated): 20.09   Intake/Output last 2 shifts:  09/19 0701 - 09/20 0700 In: 1343.1 [I.V.:766.9; IV Piggyback:576.2] Out: 1035 [Urine:1035]   Physical Exam:  Constitutional: alert, cooperative and no distress  Respiratory: breathing non-labored at rest; on [place] Cardiovascular: tachycardic to 115 and sinus rhythm  Gastrointestinal: Soft, incisional soreness, non-distended, no rebound/guarding. Surgical drain in RLQ with serous output  Integumentary: Laparoscopic and laparotomy incisions are CDI with staples and honeycomb, no appreciable erythema   Labs:  CBC Latest Ref Rng & Units 01/22/2021 01/21/2021 01/20/2021  WBC 4.0 - 10.5 K/uL 10.4 12.3(H) 8.7  Hemoglobin 12.0 - 15.0 g/dL 01/22/2021) 1.6(W) 7.3(X  Hematocrit 36.0 - 46.0 % 29.8(L) 29.1(L) 37.2  Platelets 150 - 400 K/uL 182 189 191   CMP Latest Ref Rng & Units 01/22/2021 01/21/2021 01/20/2021  Glucose 70 - 99 mg/dL 01/22/2021) 269(S) 854(O)  BUN 8 - 23  mg/dL 270(J) 50(K) 93(G)  Creatinine 0.44 - 1.00 mg/dL 18(E) 9.93(Z) 1.69(C)  Sodium 135 - 145 mmol/L 142 140 137  Potassium 3.5 - 5.1 mmol/L 3.3(L) 3.6 4.1  Chloride 98 - 111 mmol/L 106 107 101  CO2 22 - 32 mmol/L 26 23 24   Calcium 8.9 - 10.3 mg/dL 7.9(L) 7.7(L) 8.6(L)  Total Protein 6.5 - 8.1 g/dL 6.0(L) - 6.1(L)  Total Bilirubin 0.3 - 1.2 mg/dL 0.7 - 0.8  Alkaline Phos 38 - 126 U/L 56 - 44  AST 15 - 41 U/L 31 - 29  ALT 0 - 44 U/L 12 - 19     Imaging studies: No new pertinent imaging studies   Assessment/Plan:  87 y.o. female 3 Days Post-Op s/p exploratory laparotomy, small bowel resections x2, open repair of right inguinal hernia for obstruction and volvulus resulting in ischemia   - Continue NPO; she will need to be NPO for a few days given her small bowel anastomoses x2 - Continue TPN; advance to goal   - Monitor K+; replete   - Continue IV Abx (Zosyn)              - Appreciate PCCM assistance with vasopressors and ventilator support - Monitor abdominal examination; on-going bowel function  - Monitor leukocytosis; resolved             - Monitor renal function; improved and making urine; continue foley for now  - Engage therapies in next 24-48 hours    - Okay to resume chemical DVT prophylaxis   All of the above findings  and recommendations were discussed with the patient, and the medical team, and all of patient's questions were answered to her expressed satisfaction.  -- Lynden Oxford, PA-C [facility] 01/22/2021, 7:15 AM [phone] M-F: 7am - 4pm

## 2021-01-23 ENCOUNTER — Inpatient Hospital Stay: Payer: Medicare Other

## 2021-01-23 DIAGNOSIS — Z66 Do not resuscitate: Secondary | ICD-10-CM | POA: Diagnosis not present

## 2021-01-23 DIAGNOSIS — I499 Cardiac arrhythmia, unspecified: Secondary | ICD-10-CM | POA: Diagnosis not present

## 2021-01-23 DIAGNOSIS — Z515 Encounter for palliative care: Secondary | ICD-10-CM | POA: Diagnosis not present

## 2021-01-23 DIAGNOSIS — K4030 Unilateral inguinal hernia, with obstruction, without gangrene, not specified as recurrent: Secondary | ICD-10-CM | POA: Diagnosis not present

## 2021-01-23 DIAGNOSIS — E876 Hypokalemia: Secondary | ICD-10-CM

## 2021-01-23 DIAGNOSIS — Z7189 Other specified counseling: Secondary | ICD-10-CM | POA: Diagnosis not present

## 2021-01-23 LAB — CBC
HCT: 32.2 % — ABNORMAL LOW (ref 36.0–46.0)
Hemoglobin: 11.1 g/dL — ABNORMAL LOW (ref 12.0–15.0)
MCH: 32.6 pg (ref 26.0–34.0)
MCHC: 34.5 g/dL (ref 30.0–36.0)
MCV: 94.7 fL (ref 80.0–100.0)
Platelets: 229 10*3/uL (ref 150–400)
RBC: 3.4 MIL/uL — ABNORMAL LOW (ref 3.87–5.11)
RDW: 13.2 % (ref 11.5–15.5)
WBC: 17.3 10*3/uL — ABNORMAL HIGH (ref 4.0–10.5)
nRBC: 0.5 % — ABNORMAL HIGH (ref 0.0–0.2)

## 2021-01-23 LAB — BASIC METABOLIC PANEL
Anion gap: 8 (ref 5–15)
Anion gap: 9 (ref 5–15)
Anion gap: 8 (ref 5–15)
BUN: 54 mg/dL — ABNORMAL HIGH (ref 8–23)
BUN: 49 mg/dL — ABNORMAL HIGH (ref 8–23)
BUN: 51 mg/dL — ABNORMAL HIGH (ref 8–23)
CO2: 27 mmol/L (ref 22–32)
CO2: 26 mmol/L (ref 22–32)
CO2: 28 mmol/L (ref 22–32)
Calcium: 8.1 mg/dL — ABNORMAL LOW (ref 8.9–10.3)
Calcium: 8.3 mg/dL — ABNORMAL LOW (ref 8.9–10.3)
Calcium: 8.3 mg/dL — ABNORMAL LOW (ref 8.9–10.3)
Chloride: 111 mmol/L (ref 98–111)
Chloride: 112 mmol/L — ABNORMAL HIGH (ref 98–111)
Chloride: 113 mmol/L — ABNORMAL HIGH (ref 98–111)
Creatinine, Ser: 1.75 mg/dL — ABNORMAL HIGH (ref 0.44–1.00)
Creatinine, Ser: 1.51 mg/dL — ABNORMAL HIGH (ref 0.44–1.00)
Creatinine, Ser: 1.49 mg/dL — ABNORMAL HIGH (ref 0.44–1.00)
GFR, Estimated: 26 mL/min — ABNORMAL LOW (ref >60)
GFR, Estimated: 31 mL/min — ABNORMAL LOW (ref >60)
GFR, Estimated: 32 mL/min — ABNORMAL LOW (ref >60)
Glucose, Bld: 199 mg/dL — ABNORMAL HIGH (ref 70–99)
Glucose, Bld: 226 mg/dL — ABNORMAL HIGH (ref 70–99)
Glucose, Bld: 203 mg/dL — ABNORMAL HIGH (ref 70–99)
Potassium: 3.1 mmol/L — ABNORMAL LOW (ref 3.5–5.1)
Potassium: 3.5 mmol/L (ref 3.5–5.1)
Potassium: 3.4 mmol/L — ABNORMAL LOW (ref 3.5–5.1)
Sodium: 146 mmol/L — ABNORMAL HIGH (ref 135–145)
Sodium: 147 mmol/L — ABNORMAL HIGH (ref 135–145)
Sodium: 149 mmol/L — ABNORMAL HIGH (ref 135–145)

## 2021-01-23 LAB — BLOOD GAS, VENOUS
Acid-Base Excess: 0.6 mmol/L (ref 0.0–2.0)
Bicarbonate: 26 mmol/L (ref 20.0–28.0)
FIO2: 40
O2 Saturation: 72.3 %
Patient temperature: 37
pCO2, Ven: 44 mmHg (ref 44.0–60.0)
pH, Ven: 7.38 (ref 7.250–7.430)
pO2, Ven: 39 mmHg (ref 32.0–45.0)

## 2021-01-23 LAB — PHOSPHORUS: Phosphorus: 1.8 mg/dL — ABNORMAL LOW (ref 2.5–4.6)

## 2021-01-23 LAB — GLUCOSE, CAPILLARY
Glucose-Capillary: 169 mg/dL — ABNORMAL HIGH (ref 70–99)
Glucose-Capillary: 163 mg/dL — ABNORMAL HIGH (ref 70–99)
Glucose-Capillary: 196 mg/dL — ABNORMAL HIGH (ref 70–99)

## 2021-01-23 LAB — MAGNESIUM: Magnesium: 2.3 mg/dL (ref 1.7–2.4)

## 2021-01-23 MED ORDER — DILTIAZEM LOAD VIA INFUSION: 10.0000 mg | Freq: Once | INTRAVENOUS | Status: DC

## 2021-01-23 MED ORDER — HYDRALAZINE HCL 20 MG/ML IJ SOLN
INTRAMUSCULAR | Status: AC
  Filled 2021-01-23: qty 1

## 2021-01-23 MED ORDER — POTASSIUM PHOSPHATES 15 MMOLE/5ML IV SOLN
15.0000 mmol | Freq: Once | INTRAVENOUS | Status: AC
  Administered 2021-01-23: 15 mmol via INTRAVENOUS
  Filled 2021-01-23: qty 5

## 2021-01-23 MED ORDER — METOPROLOL TARTRATE 5 MG/5ML IV SOLN
10.0000 mg | Freq: Four times a day (QID) | INTRAVENOUS | Status: DC
  Administered 2021-01-23 – 2021-01-24 (×5): 10 mg via INTRAVENOUS
  Filled 2021-01-23 (×5): qty 10

## 2021-01-23 MED ORDER — TRAVASOL 10 % IV SOLN
INTRAVENOUS | Status: AC
  Filled 2021-01-23: qty 806.4

## 2021-01-23 MED ORDER — METOPROLOL TARTRATE 5 MG/5ML IV SOLN
10.0000 mg | Freq: Once | INTRAVENOUS | Status: AC
  Administered 2021-01-23: 10 mg via INTRAVENOUS
  Filled 2021-01-23: qty 10

## 2021-01-23 MED ORDER — FLEET ENEMA 7-19 GM/118ML RE ENEM: 1.0000 | ENEMA | Freq: Every day | RECTAL | Status: DC | PRN

## 2021-01-23 MED ORDER — HYDRALAZINE HCL 20 MG/ML IJ SOLN
10.0000 mg | INTRAMUSCULAR | Status: DC | PRN
  Administered 2021-01-23 – 2021-01-24 (×5): 10 mg via INTRAVENOUS
  Filled 2021-01-23 (×5): qty 1

## 2021-01-23 MED ORDER — METOPROLOL TARTRATE 5 MG/5ML IV SOLN
INTRAVENOUS | Status: AC
  Filled 2021-01-23: qty 5

## 2021-01-23 MED ORDER — POTASSIUM CHLORIDE 10 MEQ/100ML IV SOLN
10.0000 meq | INTRAVENOUS | Status: AC
  Administered 2021-01-23 (×2): 10 meq via INTRAVENOUS
  Filled 2021-01-23 (×2): qty 100

## 2021-01-23 MED ORDER — INSULIN ASPART 100 UNIT/ML IJ SOLN
0.0000 [IU] | Freq: Four times a day (QID) | INTRAMUSCULAR | Status: DC
  Administered 2021-01-24: 3 [IU] via SUBCUTANEOUS
  Administered 2021-01-24: 1 [IU] via SUBCUTANEOUS
  Administered 2021-01-24 (×2): 2 [IU] via SUBCUTANEOUS
  Administered 2021-01-24: 3 [IU] via SUBCUTANEOUS
  Administered 2021-01-25: 2 [IU] via SUBCUTANEOUS
  Filled 2021-01-23 (×6): qty 1

## 2021-01-23 MED ORDER — FUROSEMIDE 10 MG/ML IJ SOLN
60.0000 mg | Freq: Once | INTRAMUSCULAR | Status: AC
  Administered 2021-01-23: 60 mg via INTRAVENOUS
  Filled 2021-01-23: qty 6

## 2021-01-23 MED ORDER — BISACODYL 10 MG RE SUPP
10.0000 mg | Freq: Once | RECTAL | Status: AC
  Administered 2021-01-23: 10 mg via RECTAL
  Filled 2021-01-23: qty 1

## 2021-01-23 NOTE — Progress Notes (Addendum)
[facility] SURGICAL PROGRESS NOTE  Hospital Day(s): 4.   Post op day(s): 4 Days Post-Op.   Interval History:  Patient seen and examined Patient more alert and interactive today Continues to endorse mild incisional soreness; some nausea No fever, chills, emesis Renal function improved; sCr - 1.75; UO - 1.4L Mild hypokalemia to 3.1; hypophosphatemia to 1.8 Surgical drain output not recorded; serous  She is on TPN She is endorsing flatus to me this morning   Vital signs in last 24 hours: [min-max] current  Temp:  [98.1 F (36.7 C)-98.9 F (37.2 C)] 98.3 F (36.8 C) (09/21 0400) Pulse Rate:  [49-131] 60 (09/21 0600) Resp:  [15-25] 25 (09/21 0500) BP: (112-193)/(66-124) 163/68 (09/21 0600) SpO2:  [94 %-100 %] 97 % (09/21 0600) Arterial Line BP: (89-140)/(56-69) 138/69 (09/20 1000) Weight:  [61.7 kg] 61.7 kg (09/21 0530)     Height: 5\' 10"  (177.8 cm) Weight: 61.7 kg BMI (Calculated): 19.52   Intake/Output last 2 shifts:  09/20 0701 - 09/21 0700 In: 1233.4 [I.V.:933.4; IV Piggyback:300] Out: 1475 [Urine:1475]   Physical Exam:  Constitutional: alert, cooperative and no distress  Respiratory: breathing non-labored at rest; on [place] Cardiovascular: regular rate and sinus rhythm  Gastrointestinal: Soft, incisional soreness, non-distended, no rebound/guarding. Surgical drain in RLQ with serous output; I did strip some clots from tubing today  Integumentary: Laparoscopic and laparotomy incisions are CDI with staples and honeycomb, no appreciable erythema   Labs:  CBC Latest Ref Rng & Units 01/22/2021 01/21/2021 01/20/2021  WBC 4.0 - 10.5 K/uL 10.4 12.3(H) 8.7  Hemoglobin 12.0 - 15.0 g/dL 01/22/2021) 4.1(L) 2.4(M  Hematocrit 36.0 - 46.0 % 29.8(L) 29.1(L) 37.2  Platelets 150 - 400 K/uL 182 189 191   CMP Latest Ref Rng & Units 01/23/2021 01/22/2021 01/21/2021  Glucose 70 - 99 mg/dL 01/23/2021) 272(Z) 366(Y)  BUN 8 - 23 mg/dL 403(K) 74(Q) 59(D)  Creatinine 0.44 - 1.00 mg/dL 63(O)  7.56(E) 3.32(R)  Sodium 135 - 145 mmol/L 146(H) 142 140  Potassium 3.5 - 5.1 mmol/L 3.1(L) 3.3(L) 3.6  Chloride 98 - 111 mmol/L 111 106 107  CO2 22 - 32 mmol/L 27 26 23   Calcium 8.9 - 10.3 mg/dL 8.1(L) 7.9(L) 7.7(L)  Total Protein 6.5 - 8.1 g/dL - 6.0(L) -  Total Bilirubin 0.3 - 1.2 mg/dL - 0.7 -  Alkaline Phos 38 - 126 U/L - 56 -  AST 15 - 41 U/L - 31 -  ALT 0 - 44 U/L - 12 -     Imaging studies: No new pertinent imaging studies   Assessment/Plan:  87 y.o. female 4 Days Post-Op s/p exploratory laparotomy, small bowel resections x2, open repair of right inguinal hernia for obstruction and volvulus resulting in ischemia   - Continue NPO; she will need to be NPO for a few days given her small bowel anastomoses x2 - Continue TPN  - Monitor K+; replete   - Continue IV Abx (Ceftriaxone + Flagyl)  - Monitor abdominal examination; on-going bowel function  - Monitor leukocytosis; resolved             - Monitor renal function; improved and making urine; continue foley for now  - Engage therapies in next 24-48 hours    - Okay to continue chemical DVT prophylaxis   - Transfer to floor one medically stable   All of the above findings and recommendations were discussed with the patient, and the medical team, and all of patient's questions were answered to her expressed satisfaction.  --  Lynden Oxford, PA-C [facility] 01/23/2021, 7:35 AM [phone] M-F: 7am - 4pm

## 2021-01-23 NOTE — Progress Notes (Addendum)
Cross Cover Patient with continued runs of SVT, ST with PAC's and SVT of short duration. Blood pressures also elevated with systolics 180-200. Resp rates 35-40,  denied pain. Oxygen sats 94-95% on 4.5 L [place] oxygen.  Was given 10 mg of metoprolol for tachy rhythms, not effective as they stilll continued to occur intermittently.  10 mg hydralazine give for BP.  BBS course throughout. More in right.  Chest xray  IMPRESSION: 1. Increased opacities in the right upper lung, which are nonspecific but could represent infection, atelectasis, or edema. 2. Previously noted pneumoperitoneum is no longer seen, possibly secondary to semi-erect positioning.  Venous blood gas 7.38, 44, 39, 26  BMP K remains low at 3.4, creatinine some improved at 1.49 Glucose remains at or above 200  CBC done previously noted with worsening leukocytosis with predominant neutrophils   Bipap to help with breathing effort 60 mg lasix Supplement K goal >4 Continue rocephin and metronidazole as location of opacities in right upper lung could be related to aspiration of which this should provide adequate coverage Patient again went into sustained tachycardia with stable pressures Diltiazem infusion without bolus initiated, rate greatly improved, blood pressure stable

## 2021-01-23 NOTE — Consult Note (Signed)
CARDIOLOGY CONSULT NOTE               Patient ID: Vanessa Lang MRN: [id number] DOB/AGE: Dec 17, 1922 87 y.o.  Admit date: 02-04-21 Referring Physician Ernest Haber hospitalist Primary Physician Fran Lowes Primary Cardiologist  Reason for Consultation arrhythmia seen  HPI: Patient 87 year old female postop from incarcerated right hernia she has a history of prior incarceration with strangulated right inguinal hernia requiring emergency surgery in the past patient presented with small bowel necrosis incarceration requiring surgery patient was postop and developed tachycardia has a history of permanent pacemaker in place history of hypertension hyperlipidemia congestive heart failure.  Review of systems complete and found to be negative unless listed above     Past Medical History:  Diagnosis Date   Anginal pain (HCC) 09/06/2019   last episode 2 months ago   Arthritis    Bowel obstruction (HCC)    CHF (congestive heart failure) (HCC)    Depression    Diverticulitis    Dyspnea    GERD (gastroesophageal reflux disease)    Gout    Gout    Heart murmur    Heart valve disorder    HLD (hyperlipidemia)    HTN (hypertension)    Hypothyroidism    Neuropathy    Presence of permanent cardiac pacemaker 1/26/201   [employer] [id number]   Wears dentures    partial upper, full lower    Past Surgical History:  Procedure Laterality Date   ABDOMINAL HYSTERECTOMY     ABDOMINAL SURGERY     BOWEL RESECTION  2021-02-04   Procedure: SMALL BOWEL RESECTION X 2;  Surgeon: Campbell Lerner, MD;  Location: [facility];  Service: General;;   CATARACT EXTRACTION W/PHACO Left 09/15/2019   Procedure: CATARACT EXTRACTION PHACO AND INTRAOCULAR LENS PLACEMENT (IOC) LEFT;  Surgeon: Galen Manila, MD;  Location: [facility];  Service: Ophthalmology;  Laterality: Left;  Lot # [id number] H [place]: 00:38.8 CDE: 6.81   CATARACT EXTRACTION W/PHACO Right 10/13/2019   Procedure: CATARACT EXTRACTION PHACO AND  INTRAOCULAR LENS PLACEMENT (IOC) RIGHT;  Surgeon: Galen Manila, MD;  Location: [facility];  Service: Ophthalmology;  Laterality: Right;  US00:35.5 CDE5.35 [id number]   COLON SURGERY     colon resection (for blockage)  with hernia repair   HERNIA REPAIR     and colon resection   INGUINAL HERNIA REPAIR Right February 04, 2021   Procedure: HERNIA REPAIR INGUINAL INCARCERATED;  Surgeon: Campbell Lerner, MD;  Location: [facility];  Service: General;  Laterality: Right;   IR ANGIOGRAM VISCERAL SELECTIVE  06/28/2018   LAPAROTOMY  Feb 04, 2021   Procedure: EXPLORATORY LAPAROTOMY;  Surgeon: Campbell Lerner, MD;  Location: [facility];  Service: General;;   PACEMAKER INSERTION     PPM GENERATOR CHANGEOUT N/A 07/04/2020   Procedure: PPM GENERATOR CHANGEOUT;  Surgeon: Marcina Millard, MD;  Location: [facility];  Service: Cardiovascular;  Laterality: N/A;   THYROIDECTOMY      Medications Prior to Admission  Medication Sig Dispense Refill Last Dose   amLODipine (NORVASC) 5 MG tablet Take 5 mg by mouth daily at 12 noon.   Past Week   aspirin EC 81 MG tablet Take 81 mg by mouth daily at 12 noon.   Past Week   atenolol (TENORMIN) 50 MG tablet Take 50 mg by mouth daily at 12 noon.   Past Week   escitalopram (LEXAPRO) 5 MG tablet Take 5 mg by mouth daily at 12 noon.   Past Week   ferrous sulfate 325 (65 FE) MG tablet Take  1 tablet (325 mg total) by mouth 2 (two) times daily with a meal. 60 tablet 3 Past Week   gabapentin (NEURONTIN) 100 MG capsule Take 100 mg by mouth 3 (three) times daily.    Past Week   hydrochlorothiazide (HYDRODIURIL) 25 MG tablet Take 25 mg by mouth daily at 12 noon.   Past Week   isosorbide mononitrate (IMDUR) 30 MG 24 hr tablet Take 30 mg by mouth daily.    Past Week   lisinopril (PRINIVIL,ZESTRIL) 40 MG tablet Take 40 mg by mouth daily at 12 noon.   Past Week   Multiple Vitamin (MULTIVITAMIN WITH MINERALS) TABS tablet Take 1 tablet by mouth daily.   Past Week   pantoprazole  (PROTONIX) 40 MG tablet Take 40 mg by mouth 2 (two) times daily.    Past Week   senna (SENOKOT) 8.6 MG tablet Take 1 tablet by mouth daily as needed for constipation.   Past Week   SYNTHROID 75 MCG tablet Take 75 mcg by mouth daily at 6 (six) AM. (0500)   Past Week   ALPRAZolam (XANAX) 0.25 MG tablet Take 0.25 mg by mouth daily as needed for anxiety. (Patient not taking: No sig reported)   Not Taking   meloxicam (MOBIC) 7.5 MG tablet Take 7.5 mg by mouth daily as needed for pain.    unknown at prn   Social History   Socioeconomic History   Marital status: Widowed    Spouse name: Not on file   Number of children: Not on file   Years of education: Not on file   Highest education level: Not on file  Occupational History   Not on file  Tobacco Use   Smoking status: Former    Types: Cigarettes    Quit date: 49    Years since quitting: 34.7   Smokeless tobacco: Never  Vaping Use   Vaping Use: Never used  Substance and Sexual Activity   Alcohol use: No    Alcohol/week: 0.0 standard drinks   Drug use: No   Sexual activity: Not on file  Other Topics Concern   Not on file  Social History Narrative   Not on file   Social Determinants of Health   Financial Resource Strain: Not on file  Food Insecurity: Not on file  Transportation Needs: Not on file  Physical Activity: Not on file  Stress: Not on file  Social Connections: Not on file  Intimate Partner Violence: Not on file    Family History  Problem Relation Age of Onset   Thyroid disease Mother    Hypertension Sister    Thyroid disease Sister    Heart attack Brother    Cancer Brother    Colon cancer Sister       Review of systems complete and found to be negative unless listed above      PHYSICAL EXAM  General: Well developed, well nourished, in no acute distress HEENT:  Normocephalic and atramatic Neck:  No JVD.  Lungs: Clear bilaterally to auscultation and percussion. Heart: HRRR . Normal S1 and S2 without  gallops or murmurs.  Abdomen: Bowel sounds are positive, abdomen soft and non-tender  Msk:  Back normal, normal gait. Normal strength and tone for age. Extremities: No clubbing, cyanosis or edema.   Neuro: Alert and oriented X 3. Psych:  Good affect, responds appropriately  Labs:   Lab Results  Component Value Date   WBC 10.4 01/22/2021   HGB 9.9 (L) 01/22/2021   HCT 29.8 (  L) 01/22/2021   MCV 97.7 01/22/2021   PLT 182 01/22/2021    Recent Labs  Lab 01/22/21 0204 01/23/21 0419  NA 142 146*  K 3.3* 3.1*  CL 106 111  CO2 26 27  BUN 67* 54*  CREATININE 2.64* 1.75*  CALCIUM 7.9* 8.1*  PROT 6.0*  --   BILITOT 0.7  --   ALKPHOS 56  --   ALT 12  --   AST 31  --   GLUCOSE 138* 199*   Lab Results  Component Value Date   TROPONINI <0.03 12/06/2015   No results found for: CHOL No results found for: HDL No results found for: [facility] Lab Results  Component Value Date   TRIG 153 (H) 01/22/2021   TRIG 116 01/20/2021   No results found for: CHOLHDL No results found for: LDLDIRECT    Radiology: DG Abd 1 View  Result Date: 02-12-21 CLINICAL DATA:  Intubation and orogastric tube placement. EXAM: ABDOMEN - 1 VIEW COMPARISON:  Same day chest radiograph, abdomen radiograph 04/03/2014, CT abdomen pelvis 01/17/2021 FINDINGS: Enteric tube tip and side port are below the GE junction. Multiple surgical staples overlying the mid and right abdomen. No dilated, air-filled loops of bowel to suggest obstruction. IMPRESSION: Enteric tube in appropriate position. No evidence of bowel obstruction. Electronically Signed   By: Wiliam Ke M.D.   On: 02-12-21 23:36   CT HEAD WO CONTRAST ( )  Result Date: 01/21/2021 CLINICAL DATA:  Altered mental status EXAM: CT HEAD WITHOUT CONTRAST TECHNIQUE: Contiguous axial images were obtained from the base of the skull through the vertex without intravenous contrast. COMPARISON:  10/24/2004 FINDINGS: Brain: No evidence of acute infarction, hemorrhage,  hydrocephalus, extra-axial collection or mass lesion/mass effect. Chronic atrophic and ischemic changes are noted stable from the prior exam. Vascular: No hyperdense vessel or unexpected calcification. Skull: Normal. Negative for fracture or focal lesion. Sinuses/Orbits: No acute finding. Other: None. IMPRESSION: Chronic atrophic and ischemic changes are noted. No acute abnormality noted. Electronically Signed   By: Alcide Clever M.D.   On: 01/21/2021 21:37   CT Abdomen Pelvis W Contrast  Result Date: 01/17/2021 CLINICAL DATA:  Epigastric pain. EXAM: CT ABDOMEN AND PELVIS WITH CONTRAST TECHNIQUE: Multidetector CT imaging of the abdomen and pelvis was performed using the standard protocol following bolus administration of intravenous contrast. CONTRAST:  85mL OMNIPAQUE IOHEXOL 350 MG/ML SOLN COMPARISON:  To 10/23/2018 FINDINGS: Lower chest: Unremarkable. Hepatobiliary: No suspicious focal abnormality within the liver parenchyma. There is no evidence for gallstones, gallbladder wall thickening, or pericholecystic fluid. Common bile duct measures upper normal diameter at 6 mm. Pancreas: Diffuse dilatation of the main pancreatic duct, similar to prior without mass lesion or peripancreatic edema. Spleen: No splenomegaly. No focal mass lesion. Adrenals/Urinary Tract: No adrenal nodule or mass. Multiple lesions of varying size and attenuation are identified in both kidneys, similar to prior and compatible with simple and complex cysts containing varying degrees of proteinaceous debris/hemorrhage. Some of the cysts demonstrate peripheral calcification, as before. No overtly suspicious enhancing mass lesion in either kidney. No evidence for hydroureter. The urinary bladder appears normal for the degree of distention. Stomach/Bowel: Stomach is decompressed. Duodenum is normally positioned as is the ligament of Treitz. Small bowel loops in the left abdomen are dilated up to 3.1 cm diameter. There is a large right groin  hernia containing small bowel loops including a markedly distended 3.5 cm fluid-filled loop of small bowel the dilated small bowel loops in the left abdomen track out into  the hernia with decompressed small bowel tracking back into the peritoneal cavity. Diffuse diverticular disease is seen in the colon. Vascular/Lymphatic: There is moderate atherosclerotic calcification of the abdominal aorta without aneurysm. There is no gastrohepatic or hepatoduodenal ligament lymphadenopathy. No retroperitoneal or mesenteric lymphadenopathy. No pelvic sidewall lymphadenopathy. Reproductive: No adnexal mass. Other: Small volume free fluid seen in the pelvis. Musculoskeletal: No worrisome lytic or sclerotic osseous abnormality. IMPRESSION: 1. Large right groin hernia containing small bowel loops. Dilated small bowel in the left abdomen tracks down into the hernia where a markedly distended fluid-filled loop of small bowel is identified with additional decompressed small bowel loops tracking back into the peritoneal cavity. Colon is nondilated. Imaging features compatible with small bowel obstruction secondary to incarcerated right groin hernia. 2. Small volume free fluid in the pelvis. 3. Multiple bilateral simple and complex renal cysts containing varying degrees of proteinaceous debris/hemorrhage. 4. Diffuse dilatation of the main pancreatic duct, similar to prior. 5. Aortic Atherosclerosis (ICD10-I70.0). Electronically Signed   By: Kennith Center M.D.   On: 01/17/2021 12:05   DG Chest Port 1 View  Result Date: 01/21/2021 CLINICAL DATA:  87 year old female status post PICC line placement. EXAM: PORTABLE CHEST 1 VIEW COMPARISON:  Chest x-ray 01/14/2021. FINDINGS: Previously noted endotracheal and nasogastric tubes have been removed. There is a right upper extremity PICC with tip terminating in the right atrium. Elevation of the right hemidiaphragm with opacity at the right lung base which may reflect areas of atelectasis  and/or consolidation. Left lung appears clear. No pneumothorax. No pleural effusions. No evidence of pulmonary edema. Heart size is moderately enlarged. Upper mediastinal contours are within normal limits. Atherosclerotic calcifications in the thoracic aorta. Left-sided pacemaker device in place with lead tips projecting over the expected location of the right atrium and right ventricle. Small volume of pneumoperitoneum noted beneath the right hemidiaphragm. IMPRESSION: 1. Study is positive for pneumoperitoneum beneath the right hemidiaphragm, presumably iatrogenic in the setting of recent laparoscopy on 01/28/2021. 2. Low lung volumes with elevation of the right hemidiaphragm areas of atelectasis and/or consolidation in the right lower lobe. 3. Moderate cardiomegaly. 4. Aortic atherosclerosis. Electronically Signed   By: Trudie Reed M.D.   On: 01/21/2021 18:24   DG Chest Port 1 View  Result Date: 01/18/2021 CLINICAL DATA:  Intubation EXAM: PORTABLE CHEST 1 VIEW COMPARISON:  07/29/2019 FINDINGS: Support Apparatus: --Endotracheal tube: Tip 3.5 cm above the inferior margin of the carina. --Enteric tube:Tip and sideport project over the stomach. --Vascular catheter(s):None --Other: None The heart size and mediastinal contours are within normal limits. The lungs are clear. No pleural effusion or pneumothorax. IMPRESSION: Endotracheal tube tip 3.5 cm above the inferior margin of the carina. Electronically Signed   By: Deatra Robinson M.D.   On: 01/13/2021 23:31   ECHOCARDIOGRAM LIMITED  Result Date: 01/22/2021    ECHOCARDIOGRAM LIMITED REPORT   Patient Name:   Vanessa Lang Date of Exam: 01/22/2021 Medical Rec #:  [id number]       Height:       70.0 in Accession #:    [id number]      Weight:       140.0 lb Date of Birth:  07-14-1922       BSA:          1.794 m Patient Age:    89 years        BP:           128/67 mmHg Patient Gender: F  HR:           59 bpm. Exam Location:  [facility] Procedure: 2D Echo,  Cardiac Doppler and Color Doppler Indications:     Shock  History:         Patient has no prior history of Echocardiogram examinations.                  CHF, Pacemaker; Signs/Symptoms:Murmur.  Sonographer:     Cristela Blue Referring Phys:  [id number] Martina Sinner Diagnosing Phys: Alwyn Pea MD IMPRESSIONS  1. Left ventricular ejection fraction, by estimation, is 50 to 55%. The left ventricle has low normal function. Left ventricular diastolic parameters are consistent with Grade II diastolic dysfunction (pseudonormalization).  2. Right ventricular systolic function is normal. The right ventricular size is mildly enlarged.  3. Left atrial size was mild to moderately dilated.  4. The mitral valve is grossly normal. Trivial mitral valve regurgitation.  5. The aortic valve is calcified. Moderate aortic valve stenosis. FINDINGS  Left Ventricle: Left ventricular ejection fraction, by estimation, is 50 to 55%. The left ventricle has low normal function. The left ventricular internal cavity size was normal in size. There is no asymmetric left ventricular hypertrophy of the posterior segment. Left ventricular diastolic parameters are consistent with Grade II diastolic dysfunction (pseudonormalization). Right Ventricle: The right ventricular size is mildly enlarged. No increase in right ventricular wall thickness. Right ventricular systolic function is normal. Left Atrium: Left atrial size was mild to moderately dilated. Right Atrium: Right atrial size was normal in size. Pericardium: There is no evidence of pericardial effusion. Mitral Valve: The mitral valve is grossly normal. Trivial mitral valve regurgitation. MV peak gradient, 7.6 mmHg. The mean mitral valve gradient is 2.0 mmHg. Tricuspid Valve: The tricuspid valve is normal in structure. Tricuspid valve regurgitation is mild. Aortic Valve: The aortic valve is calcified. Moderate aortic stenosis is present. Aortic valve mean gradient measures 41.5 mmHg. Aortic  valve peak gradient measures 68.2 mmHg. Aortic valve area, by VTI measures 0.92 cm. Pulmonic Valve: The pulmonic valve was grossly normal. Pulmonic valve regurgitation is not visualized. Aorta: The ascending aorta was not well visualized. IAS/Shunts: No atrial level shunt detected by color flow Doppler. LEFT VENTRICLE PLAX 2D LVIDd:         4.10 cm  Diastology LVIDs:         3.00 cm  LV e' medial:    4.03 cm/s LV PW:         1.40 cm  LV E/e' medial:  20.3 LV IVS:        0.90 cm  LV e' lateral:   5.55 cm/s LVOT diam:     2.00 cm  LV E/e' lateral: 14.7 LV SV:         76 LV SV Index:   43 LVOT Area:     3.14 cm  RIGHT VENTRICLE RV Basal diam:  3.20 cm RV S prime:     14.00 cm/s TAPSE (M-mode): 3.6 cm LEFT ATRIUM             Index       RIGHT ATRIUM           Index LA diam:        4.80 cm 2.68 cm/m  RA Area:     12.60 cm LA Vol (A2C):   81.0 ml 45.16 ml/m RA Volume:   29.30 ml  16.34 ml/m LA Vol (A4C):   73.9 ml 41.20 ml/m LA Biplane Vol:  79.3 ml 44.21 ml/m  AORTIC VALVE                    PULMONIC VALVE AV Area (Vmax):    0.80 cm     PV Vmax:        0.64 m/s AV Area (Vmean):   0.77 cm     PV Peak grad:   1.7 mmHg AV Area (VTI):     0.92 cm     RVOT Peak grad: 1 mmHg AV Vmax:           413.00 cm/s AV Vmean:          300.500 cm/s AV VTI:            0.831 m AV Peak Grad:      68.2 mmHg AV Mean Grad:      41.5 mmHg LVOT Vmax:         105.00 cm/s LVOT Vmean:        73.300 cm/s LVOT VTI:          0.243 m LVOT/AV VTI ratio: 0.29  AORTA Ao Root diam: 3.10 cm MITRAL VALVE                TRICUSPID VALVE MV Area (PHT): 2.18 cm     TR Peak grad:   53.0 mmHg MV Area VTI:   1.68 cm     TR Vmax:        364.00 cm/s MV Peak grad:  7.6 mmHg MV Mean grad:  2.0 mmHg     SHUNTS MV Vmax:       1.38 m/s     Systemic VTI:  0.24 m MV Vmean:      70.6 cm/s    Systemic Diam: 2.00 cm MV Decel Time: 348 msec MV E velocity: 81.80 cm/s MV A velocity: 124.00 cm/s MV E/A ratio:  0.66 Kallin Henk D Micha Dosanjh MD Electronically signed by Alwyn Pea MD Signature Date/Time: 01/22/2021/5:06:01 PM    Final    [place] EKG SITE RITE  Result Date: 01/21/2021 If Site Rite image not attached, placement could not be confirmed due to current cardiac rhythm.   EKG: Normal sinus rhythm LVH by voltage nonspecific ST-T wave changes  ASSESSMENT AND PLAN:  Postop hernia repair History of small bowel obstruction Recent respiratory failure extubated History of permanent pacemaker sick sinus syndrome Reported episodes of SVT paroxysmal Acute on chronic renal insufficiency Borderline troponin suggestive of demand ischemia  Plan Continue current therapy ICU level care Continue postoperative management and care Will replace and correct electrolytes Continue broad-spectrum antibiotic therapy Continue metabolic insult encephalopathy continue supportive care Do not recommend any invasive procedures at this point     Signed: Alwyn Pea MD 01/23/2021, 12:37 PM

## 2021-01-23 NOTE — Consult Note (Signed)
PHARMACY - TOTAL PARENTERAL NUTRITION CONSULT NOTE   Indication:  Bowel resection  Patient Measurements: Height: 5\' 10"  (177.8 cm) Weight: 61.7 kg (136 lb 0.4 oz) IBW/kg (Calculated) : 68.5 TPN AdjBW (KG): 65 Body mass index is 19.52 kg/m.  Assessment:  Patient is a 87 y/o F with medical history including CHF, diverticulitis, GERD, gout, HTN, HLD, hypothyroidism, bowel obstruction who is admitted with SBO secondary to incarcerated right inguinal hernia. Patient went for hernia repair and laparotomy with resection of small bowel x 2 on 9/17. Pharmacy consulted to initiate TPN for indication bowel resection.   Glucose / Insulin: 24 hr CBG range 70-169 Electrolytes: hypokalemia, hypophosphatemia Renal: Scr improving Hepatic: LFTs within normal limits Intake / Output: positive 3 L GI Imaging: 9/15 CT abdomen / pelvis: "Imaging features compatible with small bowel obstruction secondary to incarcerated right groin hernia." GI Surgeries / Procedures:  9/17: ExLap with small bowel resection x 2, inguinal hernia repair for obstruction and volvulus resulting in ischemia  Central access: PICC triple lumen 9/19 TPN start date: 9/19  Nutritional Goals: Goal TPN rate is 60 mL/hr (provides 80.6 g of protein and 1533 kcals per day)  RD Assessment: Estimated Needs Total Energy Estimated Needs: 1500-1700kcal/day Total Protein Estimated Needs: 75-85g/day Total Fluid Estimated Needs: 1.4-1.7L/day  Current Nutrition:  NPO  Plan:  --Continue TPN at 60 mL/hr (full rate) Dextrose: 230.4 g Amino acids: 80.6 g Lipids: 43.2 g Fluids: 1440 mL --Electrolytes in TPN: Na 50 mEq/L, K 50 mEq/L, Ca 5 mEq/L, Mg 5 mEq/L, and Phos 15 mmol/L. Cl:Ac 1:1 --Add standard MVI and trace elements to TPN Add thiamine 100 mg daily x 3 days (day # 3) Potassium 10 mEq IV x 2 + potassium phos 15 mmol IV x 1 (suspect some element of refeeding) --Continue Sensitive q8h SSI and adjust as needed --Monitor TPN labs on  Mon/Thurs, daily until stable  10/19, PharmD, BCPS Clinical Pharmacist 01/23/2021 11:15 AM

## 2021-01-23 NOTE — Progress Notes (Signed)
Palliative:  HPI: 87 y.o. female  with past medical history of diastolic heart failure, s/p dual chamber pacemaker for sick sinus syndrome, severe aortic stenosis, hyperlipidemia admitted on 01/24/2021 with small bowel obstruction secondary to incarcerated right inguinal hernia.    I met again today with Vanessa Lang. Her voice is a little stronger today. She continues with some fluctuating confusion but understands that she is sick and in the hospital. She can tell me about her family and POA. She tells me that her sister may be traveling back to [place] today. I approached her again today regarding her desire for "CPR - pumping on your chest or hooking you up to life support or breathing machines" and she continues to nod her head no and tell me that she does not desire these interventions. She tells me that her directive says this. I clarified that this is called a DNR - do not resuscitate - and she confirms this is what she wants. She seems to understand what I am asking with consistent responses yesterday and today.   I returned to bedside when RN notified me that sister/HCPOA, Vanessa Lang, is at bedside. Vanessa Lang seems to have good understanding of her sister's fragile state. Vanessa Lang is very appreciative of Dr. Rodenberg and the conversation he had with family prior to surgery. Vanessa Lang explains that he set up very realistic expectations for surgery and that she may not survive surgery and may not thrive even if she survives hospitalization. Vanessa Lang is hoping for some gradual recovery but understands that the body can only go through so much. I reviewed with Vanessa Lang my conversation with Vanessa Lang regarding code status and resuscitation and Vanessa Lang shares that she felt her sister always wanted everything done but she also wanted to be the one to make her own decisions. After discussion of what resuscitation would mean and look like for her sister Vanessa Lang agrees with DNR status if this is what  her sister wants. Vanessa Lang plans to remain here locally throughout her sister's recovery although she may have to return to [place] for her own health appointment in the next 1-2 weeks but plans to return back after her appointment.   All questions/concerns addressed. Emotional support provided.   Exam: Alert, orientation fluctuates - some confusion. Thin, frail. Voice is stronger and more animated today (but more tired and resting this afternoon). No distress. Denies pain. Abd soft, tender. Moves all extremities.   Plan: - DNR confirmed with patient and HCPOA - Vanessa Lang.  - Continue conservative measures with hopes of improvement. Vanessa Lang understands there are limitations to medication and if decline towards end of life she would not want her sister to suffer.   35 min   , NP Palliative Medicine Team Pager [phone] (Please see [email or web address] for schedule) Team Phone [phone]    Greater than 50%  of this time was spent counseling and coordinating care related to the above assessment and plan   

## 2021-01-23 NOTE — Progress Notes (Addendum)
PROGRESS NOTE    Vanessa Lang  OEU:[id number] DOB: [birth year] DOA: 01/05/2021 PCP: Juluis Pitch, MD   Assessment & Plan:   Principal Problem:   Incarcerated right inguinal hernia Active Problems:   Hypothyroid   Acute kidney injury superimposed on CKD ([place])   SBO (small bowel obstruction) ([place])   Endotracheally intubated   On mechanically assisted ventilation ([facility])   SVT (supraventricular tachycardia) (H[facility])   Presence of permanent cardiac pacemaker   Protein-calorie malnutrition, severe  Acute hypoxic respiratory failure: likely secondary to shock due to incarcerated hernia and small bowel resection.Extubated 9/19. Continue on supplemental oxygen and wean as tolerated   Arrhythmia: etiology unclear, SVT vs SSS. Increased dose of IV metoprolol. Hx of pacemaker but placed for unknown reason. Cardio consulted  Hypokalemia: KCl repleated. Will continue to monitor    Shock : resolved. Secondary to incarcerated hernia and small bowel resection. Differential also includes cardiogenic and adrenal insufficiency. Echo shows 50-55%, grade II diastolic dysfunction, moderate aortic stenosis. Weaned off of pressors     Incarcerated right inguinal hernia s/p laparotomy with small bowel resection x 2, repair of right inguinal hernia for obstruction and volvulus with ischemia.  Continue on IV flagyl, rocephin. Continue on TPN as per general surg. Management as per general surg    AKI: baseline Cr/GFR is unknown. Cr is trending down from day prior.    Metabolic Encephalopathy:likely secondary to shock as stated above. CT head 9/19 negative. Avoid sedating agents   Hypoglycemia: resolved    Hypothyroidism: will restart levothyroxine when pt no longer NPO    DVT prophylaxis: heparin  Code Status:full  Family Communication:  Disposition Plan: depends on PT/OT recs (not consulted yet)  Status is: Inpatient  Remains inpatient appropriate because:Unsafe d/c plan, IV treatments  appropriate due to intensity of illness or inability to take PO, and Inpatient level of care appropriate due to severity of illness  Dispo: The patient is from: Home              Anticipated d/c is to: unclear              Patient currently is not medically stable to d/c.   Difficult to place patient : unclear        Level of care: Stepdown Consultants:  General surg  Cardio   Procedures:   Antimicrobials: flagyl, rocephin    Subjective: Pt c/o gas pains  Objective: Vitals:   01/23/21 0530 01/23/21 0600 01/23/21 0700 01/23/21 0800  BP: (!) 153/109 (!) 163/68 (!) 175/68   Pulse: 60 60 61 (!) 133  Resp:    (!) 26  Temp:      TempSrc:      SpO2: 96% 97% 96% 95%  Weight: 61.7 kg     Height:        Intake/Output Summary (Last 24 hours) at 01/23/2021 0842 Last data filed at 01/23/2021 0800 Gross per 24 hour  Intake 1233.43 ml  Output 1655 ml  Net -421.57 ml   Filed Weights   01/21/21 0421 01/22/21 0600 01/23/21 0530  Weight: 57.4 kg 63.5 kg 61.7 kg    Examination:  General exam: Appears calm and comfortable  Respiratory system: Clear to auscultation. Respiratory effort normal. Cardiovascular system: tachycardia. No rubs, gallops or clicks.  Gastrointestinal system: Abdomen is soft and tenderness to palpation. Hypoactive bowel sounds heard. Central nervous system: Alert and awake. Moves all extremities  Psychiatry: Judgement and insight appear abnormal. Flat mood and affect    Data  Reviewed: I have personally reviewed following labs and imaging studies  CBC: Recent Labs  Lab 01/18/2021 1422 01/12/2021 2309 01/20/21 0358 01/21/21 0343 01/22/21 0204  WBC 19.4* 3.6* 8.7 12.3* 10.4  NEUTROABS  --   --   --  10.2* 8.4*  HGB 13.2 12.2 12.2 9.8* 9.9*  HCT 40.9 35.9* 37.2 29.1* 29.8*  MCV 94.9 95.5 96.6 96.4 97.7  PLT 220 178 191 189 397   Basic Metabolic Panel: Recent Labs  Lab 01/30/2021 2309 01/20/21 0358 01/21/21 0343 01/22/21 0204 01/23/21 0419  NA  137 137 140 142 146*  K 3.6 4.1 3.6 3.3* 3.1*  CL 101 101 107 106 111  CO2 $Re'25 24 23 26 27  'rrd$ GLUCOSE 155* 113* 140* 138* 199*  BUN 63* 65* 70* 67* 54*  CREATININE 2.45* 2.60* 3.10* 2.64* 1.75*  CALCIUM 8.3* 8.6* 7.7* 7.9* 8.1*  MG 2.1 2.1 2.1 2.2 2.3  PHOS 5.4* 6.3* 4.4 3.1 1.8*   GFR: Estimated Creatinine Clearance: 18.3 mL/min (A) (by C-G formula based on SCr of 1.75 mg/dL (H)). Liver Function Tests: Recent Labs  Lab 01/17/21 0816 01/04/2021 1422 01/20/21 0358 01/22/21 0204  AST 25 33 29 31  ALT $Re'14 19 19 12  'Sxb$ ALKPHOS 68 63 44 56  BILITOT 0.7 1.1 0.8 0.7  PROT 8.1 8.6* 6.1* 6.0*  ALBUMIN 4.7 4.1 2.8* 2.3*   Recent Labs  Lab 01/17/21 0816 01/09/2021 1422  LIPASE 29 46   No results for input(s): AMMONIA in the last 168 hours. Coagulation Profile: No results for input(s): INR, PROTIME in the last 168 hours. Cardiac Enzymes: No results for input(s): CKTOTAL, CKMB, CKMBINDEX, TROPONINI in the last 168 hours. BNP (last 3 results) No results for input(s): PROBNP in the last 8760 hours. HbA1C: No results for input(s): HGBA1C in the last 72 hours. CBG: Recent Labs  Lab 01/22/21 1531 01/22/21 1916 01/22/21 2302 01/23/21 0336 01/23/21 0825  GLUCAP 107* 124* 132* 169* 163*   Lipid Profile: Recent Labs    01/22/21 0204  TRIG 153*   Thyroid Function Tests: Recent Labs    01/21/21 0805  TSH 1.821   Anemia Panel: No results for input(s): VITAMINB12, FOLATE, FERRITIN, TIBC, IRON, RETICCTPCT in the last 72 hours. Sepsis Labs: Recent Labs  Lab 01/17/21 1253 01/08/2021 1603 01/12/2021 2309  LATICACIDVEN 2.2* 2.7* 1.0    Recent Results (from the past 240 hour(s))  Culture, blood (routine x 2)     Status: None   Collection Time: 01/17/21 12:53 PM   Specimen: BLOOD  Result Value Ref Range Status   Specimen Description BLOOD RIGHT ANTECUBITAL  Final   Special Requests   Final    BOTTLES DRAWN AEROBIC AND ANAEROBIC Blood Culture results may not be optimal due to an  inadequate volume of blood received in culture bottles   Culture   Final    NO GROWTH 5 DAYS Performed at [facility], [address] [place],  [address]    Report Status 01/22/2021 FINAL  Final  Culture, blood (routine x 2)     Status: None   Collection Time: 01/17/21 12:53 PM   Specimen: BLOOD  Result Value Ref Range Status   Specimen Description BLOOD BLOOD RIGHT WRIST  Final   Special Requests   Final    BOTTLES DRAWN AEROBIC AND ANAEROBIC Blood Culture results may not be optimal due to an inadequate volume of blood received in culture bottles   Culture   Final    NO GROWTH 5  DAYS Performed at [facility], [address] [place], [place] [address]    Report Status 01/22/2021 FINAL  Final  Resp Panel by RT-PCR (Flu A&B, Covid) Nasopharyngeal Swab     Status: None   Collection Time: 01/12/2021  3:41 PM   Specimen: Nasopharyngeal Swab; Nasopharyngeal(NP) swabs in vial transport medium  Result Value Ref Range Status   SARS Coronavirus 2 by RT PCR NEGATIVE NEGATIVE Final    Comment: (NOTE) SARS-CoV-2 target nucleic acids are NOT DETECTED.  The SARS-CoV-2 RNA is generally detectable in upper respiratory specimens during the acute phase of infection. The lowest concentration of SARS-CoV-2 viral copies this assay can detect is 138 copies/mL. A negative result does not preclude SARS-Cov-2 infection and should not be used as the sole basis for treatment or other patient management decisions. A negative result may occur with  improper specimen collection/handling, submission of specimen other than nasopharyngeal swab, presence of viral mutation(s) within the areas targeted by this assay, and inadequate number of viral copies(<138 copies/mL). A negative result must be combined with clinical observations, patient history, and epidemiological information. The expected result is Negative.  Fact Sheet for Patients:   EntrepreneurPulse.com.au  Fact Sheet for Healthcare Providers:  IncredibleEmployment.be  This test is no t yet approved or cleared by the [place] FDA and  has been authorized for detection and/or diagnosis of SARS-CoV-2 by FDA under an Emergency Use Authorization (EUA). This EUA will remain  in effect (meaning this test can be used) for the duration of the COVID-19 declaration under Section 564(b)(1) of the Act, 21 U.S.C.section 360bbb-3(b)(1), unless the authorization is terminated  or revoked sooner.       Influenza A by PCR NEGATIVE NEGATIVE Final   Influenza B by PCR NEGATIVE NEGATIVE Final    Comment: (NOTE) The Xpert Xpress SARS-CoV-2/FLU/RSV plus assay is intended as an aid in the diagnosis of influenza from Nasopharyngeal swab specimens and should not be used as a sole basis for treatment. Nasal washings and aspirates are unacceptable for Xpert Xpress SARS-CoV-2/FLU/RSV testing.  Fact Sheet for Patients: EntrepreneurPulse.com.au  Fact Sheet for Healthcare Providers: IncredibleEmployment.be  This test is not yet approved or cleared by the [place] FDA and has been authorized for detection and/or diagnosis of SARS-CoV-2 by FDA under an Emergency Use Authorization (EUA). This EUA will remain in effect (meaning this test can be used) for the duration of the COVID-19 declaration under Section 564(b)(1) of the Act, 21 U.S.C. section 360bbb-3(b)(1), unless the authorization is terminated or revoked.  Performed at [facility], [address] [place], [place] [address]   MRSA Next Gen by PCR, Nasal     Status: None   Collection Time: 01/24/2021 10:36 PM   Specimen: Nasal Mucosa; Nasal Swab  Result Value Ref Range Status   MRSA by PCR Next Gen NOT DETECTED NOT DETECTED Final    Comment: (NOTE) The GeneXpert MRSA Assay (FDA approved for NASAL specimens only), is one component of a  comprehensive MRSA colonization surveillance program. It is not intended to diagnose MRSA infection nor to guide or monitor treatment for MRSA infections. Test performance is not FDA approved in patients less than 73 years old. Performed at [facility], [address] [place], [place] [address]          Radiology Studies: CT HEAD WO CONTRAST (5MM)  Result Date: 01/21/2021 CLINICAL DATA:  Altered mental status EXAM: CT HEAD WITHOUT CONTRAST TECHNIQUE: Contiguous axial images were obtained from the base  of the skull through the vertex without intravenous contrast. COMPARISON:  10/24/2004 FINDINGS: Brain: No evidence of acute infarction, hemorrhage, hydrocephalus, extra-axial collection or mass lesion/mass effect. Chronic atrophic and ischemic changes are noted stable from the prior exam. Vascular: No hyperdense vessel or unexpected calcification. Skull: Normal. Negative for fracture or focal lesion. Sinuses/Orbits: No acute finding. Other: None. IMPRESSION: Chronic atrophic and ischemic changes are noted. No acute abnormality noted. Electronically Signed   By: Inez Catalina M.D.   On: 01/21/2021 21:37   DG Chest Port 1 View  Result Date: 01/21/2021 CLINICAL DATA:  87 year old female status post PICC line placement. EXAM: PORTABLE CHEST 1 VIEW COMPARISON:  Chest x-ray 01/30/2021. FINDINGS: Previously noted endotracheal and nasogastric tubes have been removed. There is a right upper extremity PICC with tip terminating in the right atrium. Elevation of the right hemidiaphragm with opacity at the right lung base which may reflect areas of atelectasis and/or consolidation. Left lung appears clear. No pneumothorax. No pleural effusions. No evidence of pulmonary edema. Heart size is moderately enlarged. Upper mediastinal contours are within normal limits. Atherosclerotic calcifications in the thoracic aorta. Left-sided pacemaker device in place with lead tips projecting over the expected  location of the right atrium and right ventricle. Small volume of pneumoperitoneum noted beneath the right hemidiaphragm. IMPRESSION: 1. Study is positive for pneumoperitoneum beneath the right hemidiaphragm, presumably iatrogenic in the setting of recent laparoscopy on 01/08/2021. 2. Low lung volumes with elevation of the right hemidiaphragm areas of atelectasis and/or consolidation in the right lower lobe. 3. Moderate cardiomegaly. 4. Aortic atherosclerosis. Electronically Signed   By: Vinnie Langton M.D.   On: 01/21/2021 18:24   ECHOCARDIOGRAM LIMITED  Result Date: 01/22/2021    ECHOCARDIOGRAM LIMITED REPORT   Patient Name:   Vanessa Lang Date of Exam: 01/22/2021 Medical Rec #:  [id number]       Height:       70.0 in Accession #:    [id number]      Weight:       140.0 lb Date of Birth:  1922-05-11       BSA:          1.794 m Patient Age:    87 years        BP:           128/67 mmHg Patient Gender: F               HR:           59 bpm. Exam Location:  [facility] Procedure: 2D Echo, Cardiac Doppler and Color Doppler Indications:     Shock  History:         Patient has no prior history of Echocardiogram examinations.                  CHF, Pacemaker; Signs/Symptoms:Murmur.  Sonographer:     Sherrie Sport Referring Phys:  [id number] Freddi Starr Diagnosing Phys: Yolonda Kida MD IMPRESSIONS  1. Left ventricular ejection fraction, by estimation, is 50 to 55%. The left ventricle has low normal function. Left ventricular diastolic parameters are consistent with Grade II diastolic dysfunction (pseudonormalization).  2. Right ventricular systolic function is normal. The right ventricular size is mildly enlarged.  3. Left atrial size was mild to moderately dilated.  4. The mitral valve is grossly normal. Trivial mitral valve regurgitation.  5. The aortic valve is calcified. Moderate aortic valve stenosis. FINDINGS  Left Ventricle: Left ventricular ejection fraction, by estimation, is 50  to 55%. The left ventricle has  low normal function. The left ventricular internal cavity size was normal in size. There is no asymmetric left ventricular hypertrophy of the posterior segment. Left ventricular diastolic parameters are consistent with Grade II diastolic dysfunction (pseudonormalization). Right Ventricle: The right ventricular size is mildly enlarged. No increase in right ventricular wall thickness. Right ventricular systolic function is normal. Left Atrium: Left atrial size was mild to moderately dilated. Right Atrium: Right atrial size was normal in size. Pericardium: There is no evidence of pericardial effusion. Mitral Valve: The mitral valve is grossly normal. Trivial mitral valve regurgitation. MV peak gradient, 7.6 mmHg. The mean mitral valve gradient is 2.0 mmHg. Tricuspid Valve: The tricuspid valve is normal in structure. Tricuspid valve regurgitation is mild. Aortic Valve: The aortic valve is calcified. Moderate aortic stenosis is present. Aortic valve mean gradient measures 41.5 mmHg. Aortic valve peak gradient measures 68.2 mmHg. Aortic valve area, by VTI measures 0.92 cm. Pulmonic Valve: The pulmonic valve was grossly normal. Pulmonic valve regurgitation is not visualized. Aorta: The ascending aorta was not well visualized. IAS/Shunts: No atrial level shunt detected by color flow Doppler. LEFT VENTRICLE PLAX 2D LVIDd:         4.10 cm  Diastology LVIDs:         3.00 cm  LV e' medial:    4.03 cm/s LV PW:         1.40 cm  LV E/e' medial:  20.3 LV IVS:        0.90 cm  LV e' lateral:   5.55 cm/s LVOT diam:     2.00 cm  LV E/e' lateral: 14.7 LV SV:         76 LV SV Index:   43 LVOT Area:     3.14 cm  RIGHT VENTRICLE RV Basal diam:  3.20 cm RV S prime:     14.00 cm/s TAPSE (M-mode): 3.6 cm LEFT ATRIUM             Index       RIGHT ATRIUM           Index LA diam:        4.80 cm 2.68 cm/m  RA Area:     12.60 cm LA Vol (A2C):   81.0 ml 45.16 ml/m RA Volume:   29.30 ml  16.34 ml/m LA Vol (A4C):   73.9 ml 41.20 ml/m LA  Biplane Vol: 79.3 ml 44.21 ml/m  AORTIC VALVE                    PULMONIC VALVE AV Area (Vmax):    0.80 cm     PV Vmax:        0.64 m/s AV Area (Vmean):   0.77 cm     PV Peak grad:   1.7 mmHg AV Area (VTI):     0.92 cm     RVOT Peak grad: 1 mmHg AV Vmax:           413.00 cm/s AV Vmean:          300.500 cm/s AV VTI:            0.831 m AV Peak Grad:      68.2 mmHg AV Mean Grad:      41.5 mmHg LVOT Vmax:         105.00 cm/s LVOT Vmean:        73.300 cm/s LVOT VTI:  0.243 m LVOT/AV VTI ratio: 0.29  AORTA Ao Root diam: 3.10 cm MITRAL VALVE                TRICUSPID VALVE MV Area (PHT): 2.18 cm     TR Peak grad:   53.0 mmHg MV Area VTI:   1.68 cm     TR Vmax:        364.00 cm/s MV Peak grad:  7.6 mmHg MV Mean grad:  2.0 mmHg     SHUNTS MV Vmax:       1.38 m/s     Systemic VTI:  0.24 m MV Vmean:      70.6 cm/s    Systemic Diam: 2.00 cm MV Decel Time: 348 msec MV E velocity: 81.80 cm/s MV A velocity: 124.00 cm/s MV E/A ratio:  0.66 Dwayne D Callwood MD Electronically signed by Yolonda Kida MD Signature Date/Time: 01/22/2021/5:06:01 PM    Final    [place] EKG SITE RITE  Result Date: 01/21/2021 If Site Rite image not attached, placement could not be confirmed due to current cardiac rhythm.       Scheduled Meds:  chlorhexidine gluconate (MEDLINE KIT)  15 mL Mouth Rinse BID   Chlorhexidine Gluconate Cloth  6 each Topical Daily   famotidine (PEPCID) IV  20 mg Intravenous Q24H   heparin  5,000 Units Subcutaneous Q8H   insulin aspart  0-6 Units Subcutaneous Q8H   metoprolol tartrate  10 mg Intravenous Q6H   sodium chloride flush  10-40 mL Intracatheter Q12H   Continuous Infusions:  sodium chloride 10 mL/hr at 01/23/21 0600   cefTRIAXone (ROCEPHIN)  IV Stopped (01/22/21 1054)   dextrose     metronidazole Stopped (01/22/21 2331)   potassium chloride     potassium PHOSPHATE IVPB (in mmol)     TPN ADULT (ION) 60 mL/hr at 01/23/21 0600     LOS: 4 days    Time spent: 32 mins     Wyvonnia Dusky, MD [facility] Pager [phone]  If 7PM-7AM, please contact night-coverage 01/23/2021, 8:42 AM

## 2021-01-23 NOTE — Plan of Care (Addendum)
Pt made DNR (see note from palliative). HR continues to be irregular with intermittent runs of ST, PVCs, and SVTs. Cardiology following. Electrolytes replaced IV. Metoprolol dose increased to 10mg  q6h. Hydralazine given 2x today for SBP >170s. Strict NPO. TPN infusing. Foley in place. JP drain in place to bulb suction. Vanessa Lang had her 1st BM post surgery. Reports immediate relief. She does endorses significant amount of lung secretions requiring ~ q 3 hrs oral suctioning d/t weak cough. Her breathing pattern is regular with visible abdominal muscle use. CHG bath given. IV access unchanged. Sister sat with pt for most of the day.    Problem: Education: Goal: Knowledge of General Education information will improve Description: Including pain rating scale, medication(s)/side effects and non-pharmacologic comfort measures Outcome: Progressing   Problem: Health Behavior/Discharge Planning: Goal: Ability to manage health-related needs will improve Outcome: Progressing   Problem: Clinical Measurements: Goal: Ability to maintain clinical measurements within normal limits will improve Outcome: Progressing Goal: Will remain free from infection Outcome: Progressing Goal: Diagnostic test results will improve Outcome: Progressing Goal: Respiratory complications will improve Outcome: Progressing Goal: Cardiovascular complication will be avoided Outcome: Progressing   Problem: Activity: Goal: Risk for activity intolerance will decrease Outcome: Progressing   Problem: Nutrition: Goal: Adequate nutrition will be maintained Outcome: Progressing   Problem: Coping: Goal: Level of anxiety will decrease Outcome: Progressing   Problem: Elimination: Goal: Will not experience complications related to bowel motility Outcome: Progressing Goal: Will not experience complications related to urinary retention Outcome: Progressing   Problem: Pain Managment: Goal: General experience of comfort will  improve Outcome: Progressing   Problem: Safety: Goal: Ability to remain free from injury will improve Outcome: Progressing   Problem: Skin Integrity: Goal: Risk for impaired skin integrity will decrease Outcome: Progressing

## 2021-01-24 DIAGNOSIS — J9601 Acute respiratory failure with hypoxia: Secondary | ICD-10-CM | POA: Diagnosis not present

## 2021-01-24 DIAGNOSIS — R0603 Acute respiratory distress: Secondary | ICD-10-CM

## 2021-01-24 DIAGNOSIS — Z515 Encounter for palliative care: Secondary | ICD-10-CM | POA: Diagnosis not present

## 2021-01-24 DIAGNOSIS — Z7189 Other specified counseling: Secondary | ICD-10-CM | POA: Diagnosis not present

## 2021-01-24 DIAGNOSIS — K4030 Unilateral inguinal hernia, with obstruction, without gangrene, not specified as recurrent: Secondary | ICD-10-CM | POA: Diagnosis not present

## 2021-01-24 DIAGNOSIS — I499 Cardiac arrhythmia, unspecified: Secondary | ICD-10-CM | POA: Diagnosis not present

## 2021-01-24 LAB — COMPREHENSIVE METABOLIC PANEL
ALT: 9 U/L (ref 0–44)
AST: 22 U/L (ref 15–41)
Albumin: 2.5 g/dL — ABNORMAL LOW (ref 3.5–5.0)
Alkaline Phosphatase: 96 U/L (ref 38–126)
Anion gap: 11 (ref 5–15)
BUN: 52 mg/dL — ABNORMAL HIGH (ref 8–23)
CO2: 24 mmol/L (ref 22–32)
Calcium: 8.7 mg/dL — ABNORMAL LOW (ref 8.9–10.3)
Chloride: 110 mmol/L (ref 98–111)
Creatinine, Ser: 1.45 mg/dL — ABNORMAL HIGH (ref 0.44–1.00)
GFR, Estimated: 33 mL/min — ABNORMAL LOW (ref >60)
Glucose, Bld: 181 mg/dL — ABNORMAL HIGH (ref 70–99)
Potassium: 3.9 mmol/L (ref 3.5–5.1)
Sodium: 145 mmol/L (ref 135–145)
Total Bilirubin: 0.6 mg/dL (ref 0.3–1.2)
Total Protein: 6.6 g/dL (ref 6.5–8.1)

## 2021-01-24 LAB — GLUCOSE, CAPILLARY
Glucose-Capillary: 183 mg/dL — ABNORMAL HIGH (ref 70–99)
Glucose-Capillary: 118 mg/dL — ABNORMAL HIGH (ref 70–99)
Glucose-Capillary: 179 mg/dL — ABNORMAL HIGH (ref 70–99)
Glucose-Capillary: 224 mg/dL — ABNORMAL HIGH (ref 70–99)
Glucose-Capillary: 142 mg/dL — ABNORMAL HIGH (ref 70–99)
Glucose-Capillary: 131 mg/dL — ABNORMAL HIGH (ref 70–99)
Glucose-Capillary: 159 mg/dL — ABNORMAL HIGH (ref 70–99)

## 2021-01-24 LAB — PHOSPHORUS: Phosphorus: 1.6 mg/dL — ABNORMAL LOW (ref 2.5–4.6)

## 2021-01-24 LAB — PROCALCITONIN: Procalcitonin: 4.51 ng/mL

## 2021-01-24 LAB — MAGNESIUM: Magnesium: 2.2 mg/dL (ref 1.7–2.4)

## 2021-01-24 MED ORDER — LORAZEPAM 2 MG/ML IJ SOLN
0.2500 mg | Freq: Every day | INTRAMUSCULAR | Status: DC | PRN
  Administered 2021-01-24 – 2021-01-25 (×2): 0.25 mg via INTRAVENOUS
  Filled 2021-01-24 (×2): qty 1

## 2021-01-24 MED ORDER — SODIUM CHLORIDE 0.9 % IV SOLN: 1.5000 g | Freq: Four times a day (QID) | INTRAVENOUS | Status: DC

## 2021-01-24 MED ORDER — FUROSEMIDE 10 MG/ML IJ SOLN
40.0000 mg | Freq: Two times a day (BID) | INTRAMUSCULAR | Status: DC
  Administered 2021-01-24 (×2): 40 mg via INTRAVENOUS
  Filled 2021-01-24 (×2): qty 4

## 2021-01-24 MED ORDER — DILTIAZEM HCL-DEXTROSE 125-5 MG/125ML-% IV SOLN (PREMIX)
5.0000 mg/h | INTRAVENOUS | Status: DC
  Administered 2021-01-24: 5 mg/h via INTRAVENOUS
  Filled 2021-01-24: qty 125

## 2021-01-24 MED ORDER — POTASSIUM PHOSPHATES 15 MMOLE/5ML IV SOLN
30.0000 mmol | Freq: Once | INTRAVENOUS | Status: AC
  Administered 2021-01-24: 30 mmol via INTRAVENOUS
  Filled 2021-01-24: qty 10

## 2021-01-24 MED ORDER — AMIODARONE IV BOLUS ONLY 150 MG/100ML
150.0000 mg | Freq: Once | INTRAVENOUS | Status: AC
  Administered 2021-01-24: 150 mg via INTRAVENOUS
  Filled 2021-01-24: qty 100

## 2021-01-24 MED ORDER — METOPROLOL TARTRATE 5 MG/5ML IV SOLN
5.0000 mg | INTRAVENOUS | Status: DC | PRN
  Administered 2021-01-25 (×2): 5 mg via INTRAVENOUS
  Filled 2021-01-24 (×2): qty 5

## 2021-01-24 MED ORDER — POTASSIUM CHLORIDE 10 MEQ/100ML IV SOLN
10.0000 meq | INTRAVENOUS | Status: AC
Start: 2021-01-24 — End: 2021-01-24
  Administered 2021-01-24 (×4): 10 meq via INTRAVENOUS
  Filled 2021-01-24 (×4): qty 100

## 2021-01-24 MED ORDER — AMIODARONE HCL IN DEXTROSE 360-4.14 MG/200ML-% IV SOLN
60.0000 mg/h | INTRAVENOUS | Status: AC
  Administered 2021-01-24: 60 mg/h via INTRAVENOUS
  Filled 2021-01-24: qty 200

## 2021-01-24 MED ORDER — MORPHINE SULFATE (PF) 2 MG/ML IV SOLN
1.0000 mg | INTRAVENOUS | Status: DC | PRN
  Administered 2021-01-24 – 2021-01-25 (×5): 1 mg via INTRAVENOUS
  Filled 2021-01-24 (×6): qty 1

## 2021-01-24 MED ORDER — TRAVASOL 10 % IV SOLN
INTRAVENOUS | Status: DC
  Filled 2021-01-24: qty 806.4

## 2021-01-24 MED ORDER — AMIODARONE HCL IN DEXTROSE 360-4.14 MG/200ML-% IV SOLN
30.0000 mg/h | INTRAVENOUS | Status: DC
  Administered 2021-01-24 – 2021-01-25 (×2): 30 mg/h via INTRAVENOUS
  Filled 2021-01-24 (×2): qty 200

## 2021-01-24 NOTE — Progress Notes (Signed)
[facility] Cardiology    SUBJECTIVE: Resting comfortably in bed arousable denies any pain denies any shortness of breath no palpitations no chest pain   Vitals:   01/24/21 0719 01/24/21 0800 01/24/21 1000 01/24/21 1038  BP:  120/86 (!) 147/78   Pulse: 64 73 69 72  Resp: (!) 25 (!) 31 (!) 29 (!) 30  Temp:  (!) 97.5 F (36.4 C)    TempSrc:  Oral    SpO2: 99% 95% 98% 100%  Weight:      Height:         Intake/Output Summary (Last 24 hours) at 01/24/2021 1048 Last data filed at 01/24/2021 3329 Gross per 24 hour  Intake 2858.09 ml  Output 2746 ml  Net 112.09 ml      PHYSICAL EXAM  General: Well developed, well nourished, in no acute distress HEENT:  Normocephalic and atramatic Neck:  No JVD.  Lungs: Clear bilaterally to auscultation and percussion. Heart: HRRR . Normal S1 and S2 without gallops or murmurs.  Abdomen: Bowel sounds are positive, abdomen soft and non-tender  Msk:  Back normal, normal gait. Normal strength and tone for age. Extremities: No clubbing, cyanosis or edema.   Neuro: Alert and oriented X 3. Psych:  Good affect, responds appropriately   LABS: Basic Metabolic Panel: Recent Labs    01/23/21 0419 01/23/21 1656 01/23/21 2236 01/24/21 0452  NA 146*   < > 149* 145  K 3.1*   < > 3.4* 3.9  CL 111   < > 113* 110  CO2 27   < > 28 24  GLUCOSE 199*   < > 203* 181*  BUN 54*   < > 51* 52*  CREATININE 1.75*   < > 1.49* 1.45*  CALCIUM 8.1*   < > 8.3* 8.7*  MG 2.3  --   --  2.2  PHOS 1.8*  --   --  1.6*   < > = values in this interval not displayed.   Liver Function Tests: Recent Labs    01/22/21 0204 01/24/21 0452  AST 31 22  ALT 12 9  ALKPHOS 56 96  BILITOT 0.7 0.6  PROT 6.0* 6.6  ALBUMIN 2.3* 2.5*   No results for input(s): LIPASE, AMYLASE in the last 72 hours. CBC: Recent Labs    01/22/21 0204 01/23/21 1656  WBC 10.4 17.3*  NEUTROABS 8.4*  --   HGB 9.9* 11.1*  HCT 29.8* 32.2*  MCV 97.7 94.7  PLT 182 229   Cardiac Enzymes: No results  for input(s): CKTOTAL, CKMB, CKMBINDEX, TROPONINI in the last 72 hours. BNP: Invalid input(s): POCBNP D-Dimer: No results for input(s): DDIMER in the last 72 hours. Hemoglobin A1C: No results for input(s): HGBA1C in the last 72 hours. Fasting Lipid Panel: Recent Labs    01/22/21 0204  TRIG 153*   Thyroid Function Tests: No results for input(s): TSH, T4TOTAL, T3FREE, THYROIDAB in the last 72 hours.  Invalid input(s): FREET3 Anemia Panel: No results for input(s): VITAMINB12, FOLATE, FERRITIN, TIBC, IRON, RETICCTPCT in the last 72 hours.  DG Chest 1 View  Result Date: 01/23/2021 CLINICAL DATA:  Respiratory distress EXAM: CHEST  1 VIEW COMPARISON:  01/21/2021 FINDINGS: Right upper extremity PICC with tip terminating in the right atrium. Left chest cardiac device with leads in the right atrium and ventricle. Redemonstrated elevation of the right hemidiaphragm with right lung base opacity, likely associated atelectasis. Increased right upper lung opacities. The left lung appears clear. Slight blunting of the left costophrenic angle, possibly  a trace pleural effusion. The heart is moderately enlarged, unchanged mediastinal contours. Previously noted small volume pneumoperitoneum beneath the right hemidiaphragm is no longer seen. No acute osseous abnormality. IMPRESSION: 1. Increased opacities in the right upper lung, which are nonspecific but could represent infection, atelectasis, or edema. 2. Previously noted pneumoperitoneum is no longer seen, possibly secondary to semi-erect positioning. Electronically Signed   By: Wiliam Ke M.D.   On: 01/23/2021 22:22   ECHOCARDIOGRAM LIMITED  Result Date: 01/22/2021    ECHOCARDIOGRAM LIMITED REPORT   Patient Name:   Vanessa Lang Date of Exam: 01/22/2021 Medical Rec #:  [id number]       Height:       70.0 in Accession #:    [id number]      Weight:       140.0 lb Date of Birth:  1922/10/06       BSA:          1.794 m Patient Age:    87 years        BP:            128/67 mmHg Patient Gender: F               HR:           59 bpm. Exam Location:  [facility] Procedure: 2D Echo, Cardiac Doppler and Color Doppler Indications:     Shock  History:         Patient has no prior history of Echocardiogram examinations.                  CHF, Pacemaker; Signs/Symptoms:Murmur.  Sonographer:     Cristela Blue Referring Phys:  [id number] Martina Sinner Diagnosing Phys: Alwyn Pea MD IMPRESSIONS  1. Left ventricular ejection fraction, by estimation, is 50 to 55%. The left ventricle has low normal function. Left ventricular diastolic parameters are consistent with Grade II diastolic dysfunction (pseudonormalization).  2. Right ventricular systolic function is normal. The right ventricular size is mildly enlarged.  3. Left atrial size was mild to moderately dilated.  4. The mitral valve is grossly normal. Trivial mitral valve regurgitation.  5. The aortic valve is calcified. Moderate aortic valve stenosis. FINDINGS  Left Ventricle: Left ventricular ejection fraction, by estimation, is 50 to 55%. The left ventricle has low normal function. The left ventricular internal cavity size was normal in size. There is no asymmetric left ventricular hypertrophy of the posterior segment. Left ventricular diastolic parameters are consistent with Grade II diastolic dysfunction (pseudonormalization). Right Ventricle: The right ventricular size is mildly enlarged. No increase in right ventricular wall thickness. Right ventricular systolic function is normal. Left Atrium: Left atrial size was mild to moderately dilated. Right Atrium: Right atrial size was normal in size. Pericardium: There is no evidence of pericardial effusion. Mitral Valve: The mitral valve is grossly normal. Trivial mitral valve regurgitation. MV peak gradient, 7.6 mmHg. The mean mitral valve gradient is 2.0 mmHg. Tricuspid Valve: The tricuspid valve is normal in structure. Tricuspid valve regurgitation is mild. Aortic Valve: The aortic  valve is calcified. Moderate aortic stenosis is present. Aortic valve mean gradient measures 41.5 mmHg. Aortic valve peak gradient measures 68.2 mmHg. Aortic valve area, by VTI measures 0.92 cm. Pulmonic Valve: The pulmonic valve was grossly normal. Pulmonic valve regurgitation is not visualized. Aorta: The ascending aorta was not well visualized. IAS/Shunts: No atrial level shunt detected by color flow Doppler. LEFT VENTRICLE PLAX 2D LVIDd:  4.10 cm  Diastology LVIDs:         3.00 cm  LV e' medial:    4.03 cm/s LV PW:         1.40 cm  LV E/e' medial:  20.3 LV IVS:        0.90 cm  LV e' lateral:   5.55 cm/s LVOT diam:     2.00 cm  LV E/e' lateral: 14.7 LV SV:         76 LV SV Index:   43 LVOT Area:     3.14 cm  RIGHT VENTRICLE RV Basal diam:  3.20 cm RV S prime:     14.00 cm/s TAPSE (M-mode): 3.6 cm LEFT ATRIUM             Index       RIGHT ATRIUM           Index LA diam:        4.80 cm 2.68 cm/m  RA Area:     12.60 cm LA Vol (A2C):   81.0 ml 45.16 ml/m RA Volume:   29.30 ml  16.34 ml/m LA Vol (A4C):   73.9 ml 41.20 ml/m LA Biplane Vol: 79.3 ml 44.21 ml/m  AORTIC VALVE                    PULMONIC VALVE AV Area (Vmax):    0.80 cm     PV Vmax:        0.64 m/s AV Area (Vmean):   0.77 cm     PV Peak grad:   1.7 mmHg AV Area (VTI):     0.92 cm     RVOT Peak grad: 1 mmHg AV Vmax:           413.00 cm/s AV Vmean:          300.500 cm/s AV VTI:            0.831 m AV Peak Grad:      68.2 mmHg AV Mean Grad:      41.5 mmHg LVOT Vmax:         105.00 cm/s LVOT Vmean:        73.300 cm/s LVOT VTI:          0.243 m LVOT/AV VTI ratio: 0.29  AORTA Ao Root diam: 3.10 cm MITRAL VALVE                TRICUSPID VALVE MV Area (PHT): 2.18 cm     TR Peak grad:   53.0 mmHg MV Area VTI:   1.68 cm     TR Vmax:        364.00 cm/s MV Peak grad:  7.6 mmHg MV Mean grad:  2.0 mmHg     SHUNTS MV Vmax:       1.38 m/s     Systemic VTI:  0.24 m MV Vmean:      70.6 cm/s    Systemic Diam: 2.00 cm MV Decel Time: 348 msec MV E velocity:  81.80 cm/s MV A velocity: 124.00 cm/s MV E/A ratio:  0.66 Chaquetta Schlottman D Jahid Weida MD Electronically signed by Alwyn Pea MD Signature Date/Time: 01/22/2021/5:06:01 PM    Final      Echo preserved left ventricular function 50 to 55%  TELEMETRY: Currently in sinus rhythm she had bouts of SVT:  ASSESSMENT AND PLAN:  Principal Problem:   Incarcerated right inguinal hernia Active Problems:   Hypothyroid   Acute kidney injury superimposed  on CKD (HCC)   SBO (small bowel obstruction) (HCC)   Endotracheally intubated   On mechanically assisted ventilation (HCC)   SVT (supraventricular tachycardia) (HCC)   Presence of permanent cardiac pacemaker   Protein-calorie malnutrition, severe    Plan Postop abdominal surgery for incarcerated hernia recovering nicely Sick sinus syndrome will institute amiodarone therapy to help with rhythm control switch from diltiazem to metoprolol for rate management Do not recommend anticoagulation Continue conservative cardiac management Permanent pacemaker in place we will not concerned about bradycardia   Alwyn Pea, MD 01/24/2021 10:48 AM

## 2021-01-24 NOTE — Progress Notes (Signed)
Pt having intermittent SVT,ST 140-170's. Metop given x 3 during shift , Diltiazem drip started. Hydralize given prn as needed for SBP >170. Pt put on BIPAP overnight, pt with increase WOB  RR 30-40, LLL diminished on ausculation. CXR done, provider at bedside. Furosemide given once, UOP 2 L. No other concerns

## 2021-01-24 NOTE — Consult Note (Signed)
PHARMACY - TOTAL PARENTERAL NUTRITION CONSULT NOTE   Indication:  Bowel resection  Patient Measurements: Height: 5\' 10"  (177.8 cm) Weight: 61.9 kg (136 lb 7.4 oz) IBW/kg (Calculated) : 68.5 TPN AdjBW (KG): 65 Body mass index is 19.58 kg/m.  Assessment:  Patient is a 87 y/o F with medical history including CHF, diverticulitis, GERD, gout, HTN, HLD, hypothyroidism, bowel obstruction who is admitted with SBO secondary to incarcerated right inguinal hernia. Patient went for hernia repair and laparotomy with resection of small bowel x 2 on 9/17. Pharmacy consulted to initiate TPN for indication bowel resection.   Glucose / Insulin: 24 hr CBG range 70-169 Electrolytes: hypokalemia, hypophosphatemia Renal: Scr improving Hepatic: LFTs within normal limits Intake / Output: positive 3 L GI Imaging: 9/15 CT abdomen / pelvis: "Imaging features compatible with small bowel obstruction secondary to incarcerated right groin hernia." GI Surgeries / Procedures:  9/17: ExLap with small bowel resection x 2, inguinal hernia repair for obstruction and volvulus resulting in ischemia  Central access: PICC triple lumen 9/19 TPN start date: 9/19  Nutritional Goals: Goal TPN rate is 60 mL/hr (provides 80.6 g of protein and 1533 kcals per day)  RD Assessment: Estimated Needs Total Energy Estimated Needs: 1500-1700kcal/day Total Protein Estimated Needs: 75-85g/day Total Fluid Estimated Needs: 1.4-1.7L/day  Current Nutrition:  NPO  Plan:  --Continue TPN at 60 mL/hr (full rate) Dextrose: 230.4 g Amino acids: 80.6 g Lipids: 43.2 g Fluids: 1440 mL --Electrolytes in TPN: Na 50 mEq/L, K 50 mEq/L, Ca 5 mEq/L, Mg 5 mEq/L, and Phos 15 mmol/L. Cl:Ac 1:1 --Add standard MVI and trace elements to TPN Potassium 10 mEq IV x 4 given this morning and currently receiving potassium phos 30 mmol IV (suspect some element of refeeding) --Continue Sensitive q8h SSI and adjust as needed --Monitor TPN labs on Mon/Thurs,  daily until stable  10/19, PharmD, BCPS Clinical Pharmacist 01/24/2021 10:38 AM

## 2021-01-24 NOTE — Progress Notes (Signed)
Palliative:  HPI: 87 y.o. female  with past medical history of diastolic heart failure, s/p dual chamber pacemaker for sick sinus syndrome, severe aortic stenosis, hyperlipidemia admitted on 02/01/2021 with small bowel obstruction secondary to incarcerated right inguinal hernia.   I met today at Vanessa Lang along with her HCPOAs sister Vanessa Lang and friend Vanessa Lang. Vanessa Lang had a difficult night with respiratory distress requiring BiPAP, Lasix, and potential concern for aspiration. Vanessa Lang is currently off BiPAP but very labored. Her breathing worsened and she was placed back on BiPAP. While on BiPAP she complains of discomfort with mask and wants it off but then she becomes short of breath and requests mask to be put back on. Vanessa Lang and I spoke and she requests that we allow something for her nerves. Upon review of home meds she does take Xanax at home. I will order small dose of IV Lorazepam daily as needed to help her tolerate BiPAP. Family is hoping this can help her get over the hump. They do understand that her situation is tenuous and overall prognosis poor. They understand there is a high possibility that she will not thrive post-op. They still wish to give her every opportunity to improve. If she gets to a point of worsening and limited options they are open to further conversation and options. Even with BiPAP today she is still awake and able to converse and express needs with some underlying confusion. If her mental status changes and clinical decline will need to discuss goals of care further.   All questions/concerns addressed. Emotional support provided.   Exam: Thin, frail, elderly. Tachypnea and accessory muscle use off BiPAP. Abd soft, tender. Moves all extremities.   Plan: - DNR previously established.  - Continue care with hopes of gradual improvements.  - I will be off serve until Monday 01/28/21 Vanessa Lang is aware). Please call [phone] for acute palliative needs 02-15-21.    25 min  Vinie Sill, NP Palliative Medicine Team Pager [phone] (Please see [email or web address] for schedule) Team Phone [phone]    Greater than 50%  of this time was spent counseling and coordinating care related to the above assessment and plan

## 2021-01-24 NOTE — Progress Notes (Addendum)
PROGRESS NOTE    Vanessa Lang  FXO:[id number] DOB: 1923/03/03 DOA: 01/20/2021 PCP: Juluis Pitch, MD   Assessment & Plan:   Principal Problem:   Incarcerated right inguinal hernia Active Problems:   Hypothyroid   Acute kidney injury superimposed on CKD ([place])   SBO (small bowel obstruction) ([place])   Endotracheally intubated   On mechanically assisted ventilation (HCC)   SVT (supraventricular tachycardia) (HCC)   Presence of permanent cardiac pacemaker   Protein-calorie malnutrition, severe  Acute hypoxic respiratory failure: likely secondary to shock due to incarcerated hernia and small bowel resection. Extubated 9/19. Increased oxygen demand & worsening respiratory status 9/22. Repeat CXR shows increased opacities in right upper lung, could represent infection, edema or atelectasis. Started on IV lasix. Pro-cal 4.51. Will consider changing abxs if respiratory status continues to decline. Continue on supplemental oxygen and wean as tolerated   Arrhythmia: etiology unclear, SVT vs SSS. Started on IV amio drip and IV cardizem drip was d/c. Hx of pacemaker placed for SSS as per cardio. Cardio following and recs apprec  Hypokalemia: WNL today    Shock : resolved. Secondary to incarcerated hernia and small bowel resection. Differential also includes cardiogenic and adrenal insufficiency. Echo shows 50-55%, grade II diastolic dysfunction, moderate aortic stenosis. Weaned off of pressors     Incarcerated right inguinal hernia s/p laparotomy with small bowel resection x 2, repair of right inguinal hernia for obstruction and volvulus with ischemia.  Continue on IV flagyl, rocephin. Continue on TPN as per general surg. Management as per general surg    AKI: baseline Cr/GFR is unknown. Cr continues to trend down daily    Metabolic Encephalopathy: likely secondary to shock as stated above. CT head 9/19 negative. Avoid sedating agents  Hypophosphatemia: phosp repleated. Will continue to  monitor    Hypoglycemia: resolved    Hypothyroidism: will restart levothyroxine when pt no longer NPO    DVT prophylaxis: heparin  Code Status:full  Family Communication: called pt's sister but no answer so I left a message Disposition Plan: depends on PT/OT recs (not consulted yet)  Status is: Inpatient  Remains inpatient appropriate because:Unsafe d/c plan, IV treatments appropriate due to intensity of illness or inability to take PO, and Inpatient level of care appropriate due to severity of illness  Dispo: The patient is from: Home              Anticipated d/c is to: unclear              Patient currently is not medically stable to d/c.   Difficult to place patient : unclear        Level of care: Stepdown Consultants:  General surg  Cardio   Procedures:   Antimicrobials: flagyl, rocephin    Subjective: Pt c/o shortness of breath   Objective: Vitals:   01/24/21 0500 01/24/21 0600 01/24/21 0700 01/24/21 0719  BP: 137/78 109/87 (!) 154/79   Pulse: 69 61 64 64  Resp: (!) 24 (!) 31 (!) 22 (!) 25  Temp: 97.9 F (36.6 C)     TempSrc: Axillary     SpO2: 100% 100% 98% 99%  Weight:      Height:        Intake/Output Summary (Last 24 hours) at 01/24/2021 0830 Last data filed at 01/24/2021 0810 Gross per 24 hour  Intake 2868.09 ml  Output 2971 ml  Net -102.91 ml   Filed Weights   01/22/21 0600 01/23/21 0530 01/24/21 0419  Weight: 63.5 kg 61.7  kg 61.9 kg    Examination:  General exam: Appears uncomfortable  Respiratory system: diminished breath sounds b/l  Cardiovascular system: S1/S2+. No rubs or clicks Gastrointestinal system: Abd is soft, ND, & hypoactive bowel sounds  Central nervous system: Awake and alert. Moves all extremities  Psychiatry: Judgement and insight appears normal. Flat mood and affect    Data Reviewed: I have personally reviewed following labs and imaging studies  CBC: Recent Labs  Lab 01/23/2021 2309 01/20/21 0358  01/21/21 0343 01/22/21 0204 01/23/21 1656  WBC 3.6* 8.7 12.3* 10.4 17.3*  NEUTROABS  --   --  10.2* 8.4*  --   HGB 12.2 12.2 9.8* 9.9* 11.1*  HCT 35.9* 37.2 29.1* 29.8* 32.2*  MCV 95.5 96.6 96.4 97.7 94.7  PLT 178 191 189 182 818   Basic Metabolic Panel: Recent Labs  Lab 01/20/21 0358 01/21/21 0343 01/22/21 0204 01/23/21 0419 01/23/21 1656 01/23/21 2236 01/24/21 0452  NA 137 140 142 146* 147* 149* 145  K 4.1 3.6 3.3* 3.1* 3.5 3.4* 3.9  CL 101 107 106 111 112* 113* 110  CO2 _0 GLUCOSE 113* 140* 138* 199* 226* 203* 181*  BUN 65* 70* 67* 54* 49* 51* 52*  CREATININE 2.60* 3.10* 2.64* 1.75* 1.51* 1.49* 1.45*  CALCIUM 8.6* 7.7* 7.9* 8.1* 8.3* 8.3* 8.7*  MG 2.1 2.1 2.2 2.3  --   --  2.2  PHOS 6.3* 4.4 3.1 1.8*  --   --  1.6*   GFR: Estimated Creatinine Clearance: 22.2 mL/min (A) (by C-G formula based on SCr of 1.45 mg/dL (H)). Liver Function Tests: Recent Labs  Lab 01/08/2021 1422 01/20/21 0358 01/22/21 0204 01/24/21 0452  AST 33 _1 ALT _2 ALKPHOS 63 44 56 96  BILITOT 1.1 0.8 0.7 0.6  PROT 8.6* 6.1* 6.0* 6.6  ALBUMIN 4.1 2.8* 2.3* 2.5*   Recent Labs  Lab 01/26/2021 1422  LIPASE 46   No results for input(s): AMMONIA in the last 168 hours. Coagulation Profile: No results for input(s): INR, PROTIME in the last 168 hours. Cardiac Enzymes: No results for input(s): CKTOTAL, CKMB, CKMBINDEX, TROPONINI in the last 168 hours. BNP (last 3 results) No results for input(s): PROBNP in the last 8760 hours. HbA1C: No results for input(s): HGBA1C in the last 72 hours. CBG: Recent Labs  Lab 01/23/21 0825 01/23/21 1714 01/23/21 1951 01/24/21 0311 01/24/21 0553  GLUCAP 163* 183* 196* 118* 179*   Lipid Profile: Recent Labs    01/22/21 0204  TRIG 153*   Thyroid Function Tests: No results for input(s): TSH, T4TOTAL, FREET4, T3FREE, THYROIDAB in the last 72 hours.  Anemia Panel: No results for input(s): VITAMINB12, FOLATE, FERRITIN,  TIBC, IRON, RETICCTPCT in the last 72 hours. Sepsis Labs: Recent Labs  Lab 01/17/21 1253 01/09/2021 1603 01/24/2021 2309  LATICACIDVEN 2.2* 2.7* 1.0    Recent Results (from the past 240 hour(s))  Culture, blood (routine x 2)     Status: None   Collection Time: 01/17/21 12:53 PM   Specimen: BLOOD  Result Value Ref Range Status   Specimen Description BLOOD RIGHT ANTECUBITAL  Final   Special Requests   Final    BOTTLES DRAWN AEROBIC AND ANAEROBIC Blood Culture results may not be optimal due to an inadequate volume of blood received in culture bottles   Culture   Final    NO GROWTH 5 DAYS Performed at [facility], [address] [place], [place]  [address]    Report Status 01/22/2021 FINAL  Final  Culture, blood (routine x 2)     Status: None   Collection Time: 01/17/21 12:53 PM   Specimen: BLOOD  Result Value Ref Range Status   Specimen Description BLOOD BLOOD RIGHT WRIST  Final   Special Requests   Final    BOTTLES DRAWN AEROBIC AND ANAEROBIC Blood Culture results may not be optimal due to an inadequate volume of blood received in culture bottles   Culture   Final    NO GROWTH 5 DAYS Performed at [facility], [address] [place], [place] [address]    Report Status 01/22/2021 FINAL  Final  Resp Panel by RT-PCR (Flu A&B, Covid) Nasopharyngeal Swab     Status: None   Collection Time: 01/05/2021  3:41 PM   Specimen: Nasopharyngeal Swab; Nasopharyngeal(NP) swabs in vial transport medium  Result Value Ref Range Status   SARS Coronavirus 2 by RT PCR NEGATIVE NEGATIVE Final    Comment: (NOTE) SARS-CoV-2 target nucleic acids are NOT DETECTED.  The SARS-CoV-2 RNA is generally detectable in upper respiratory specimens during the acute phase of infection. The lowest concentration of SARS-CoV-2 viral copies this assay can detect is 138 copies/mL. A negative result does not preclude SARS-Cov-2 infection and should not be used as the sole basis for treatment  or other patient management decisions. A negative result may occur with  improper specimen collection/handling, submission of specimen other than nasopharyngeal swab, presence of viral mutation(s) within the areas targeted by this assay, and inadequate number of viral copies(<138 copies/mL). A negative result must be combined with clinical observations, patient history, and epidemiological information. The expected result is Negative.  Fact Sheet for Patients:  EntrepreneurPulse.com.au  Fact Sheet for Healthcare Providers:  IncredibleEmployment.be  This test is no t yet approved or cleared by the [place] FDA and  has been authorized for detection and/or diagnosis of SARS-CoV-2 by FDA under an Emergency Use Authorization (EUA). This EUA will remain  in effect (meaning this test can be used) for the duration of the COVID-19 declaration under Section 564(b)(1) of the Act, 21 U.S.C.section 360bbb-3(b)(1), unless the authorization is terminated  or revoked sooner.       Influenza A by PCR NEGATIVE NEGATIVE Final   Influenza B by PCR NEGATIVE NEGATIVE Final    Comment: (NOTE) The Xpert Xpress SARS-CoV-2/FLU/RSV plus assay is intended as an aid in the diagnosis of influenza from Nasopharyngeal swab specimens and should not be used as a sole basis for treatment. Nasal washings and aspirates are unacceptable for Xpert Xpress SARS-CoV-2/FLU/RSV testing.  Fact Sheet for Patients: EntrepreneurPulse.com.au  Fact Sheet for Healthcare Providers: IncredibleEmployment.be  This test is not yet approved or cleared by the [place] FDA and has been authorized for detection and/or diagnosis of SARS-CoV-2 by FDA under an Emergency Use Authorization (EUA). This EUA will remain in effect (meaning this test can be used) for the duration of the COVID-19 declaration under Section 564(b)(1) of the Act, 21 U.S.C. section  360bbb-3(b)(1), unless the authorization is terminated or revoked.  Performed at [facility], [address] [place],  [address]   MRSA Next Gen by PCR, Nasal     Status: None   Collection Time: 01/21/2021 10:36 PM   Specimen: Nasal Mucosa; Nasal Swab  Result Value Ref Range Status   MRSA by PCR Next Gen NOT DETECTED NOT DETECTED Final    Comment: (NOTE) The GeneXpert MRSA Assay (FDA approved for NASAL  specimens only), is one component of a comprehensive MRSA colonization surveillance program. It is not intended to diagnose MRSA infection nor to guide or monitor treatment for MRSA infections. Test performance is not FDA approved in patients less than 47 years old. Performed at [facility], [address] [place], [place] [address]          Radiology Studies: DG Chest 1 View  Result Date: 01/23/2021 CLINICAL DATA:  Respiratory distress EXAM: CHEST  1 VIEW COMPARISON:  01/21/2021 FINDINGS: Right upper extremity PICC with tip terminating in the right atrium. Left chest cardiac device with leads in the right atrium and ventricle. Redemonstrated elevation of the right hemidiaphragm with right lung base opacity, likely associated atelectasis. Increased right upper lung opacities. The left lung appears clear. Slight blunting of the left costophrenic angle, possibly a trace pleural effusion. The heart is moderately enlarged, unchanged mediastinal contours. Previously noted small volume pneumoperitoneum beneath the right hemidiaphragm is no longer seen. No acute osseous abnormality. IMPRESSION: 1. Increased opacities in the right upper lung, which are nonspecific but could represent infection, atelectasis, or edema. 2. Previously noted pneumoperitoneum is no longer seen, possibly secondary to semi-erect positioning. Electronically Signed   By: Merilyn Baba M.D.   On: 01/23/2021 22:22   ECHOCARDIOGRAM LIMITED  Result Date: 01/22/2021    ECHOCARDIOGRAM LIMITED  REPORT   Patient Name:   Vanessa Lang Date of Exam: 01/22/2021 Medical Rec #:  [id number]       Height:       70.0 in Accession #:    [id number]      Weight:       140.0 lb Date of Birth:  15-Feb-1923       BSA:          1.794 m Patient Age:    87 years        BP:           128/67 mmHg Patient Gender: F               HR:           59 bpm. Exam Location:  [facility] Procedure: 2D Echo, Cardiac Doppler and Color Doppler Indications:     Shock  History:         Patient has no prior history of Echocardiogram examinations.                  CHF, Pacemaker; Signs/Symptoms:Murmur.  Sonographer:     Sherrie Sport Referring Phys:  [id number] Freddi Starr Diagnosing Phys: Yolonda Kida MD IMPRESSIONS  1. Left ventricular ejection fraction, by estimation, is 50 to 55%. The left ventricle has low normal function. Left ventricular diastolic parameters are consistent with Grade II diastolic dysfunction (pseudonormalization).  2. Right ventricular systolic function is normal. The right ventricular size is mildly enlarged.  3. Left atrial size was mild to moderately dilated.  4. The mitral valve is grossly normal. Trivial mitral valve regurgitation.  5. The aortic valve is calcified. Moderate aortic valve stenosis. FINDINGS  Left Ventricle: Left ventricular ejection fraction, by estimation, is 50 to 55%. The left ventricle has low normal function. The left ventricular internal cavity size was normal in size. There is no asymmetric left ventricular hypertrophy of the posterior segment. Left ventricular diastolic parameters are consistent with Grade II diastolic dysfunction (pseudonormalization). Right Ventricle: The right ventricular size is mildly enlarged. No increase in right ventricular wall thickness. Right ventricular systolic function is normal. Left Atrium:  Left atrial size was mild to moderately dilated. Right Atrium: Right atrial size was normal in size. Pericardium: There is no evidence of pericardial effusion. Mitral Valve:  The mitral valve is grossly normal. Trivial mitral valve regurgitation. MV peak gradient, 7.6 mmHg. The mean mitral valve gradient is 2.0 mmHg. Tricuspid Valve: The tricuspid valve is normal in structure. Tricuspid valve regurgitation is mild. Aortic Valve: The aortic valve is calcified. Moderate aortic stenosis is present. Aortic valve mean gradient measures 41.5 mmHg. Aortic valve peak gradient measures 68.2 mmHg. Aortic valve area, by VTI measures 0.92 cm. Pulmonic Valve: The pulmonic valve was grossly normal. Pulmonic valve regurgitation is not visualized. Aorta: The ascending aorta was not well visualized. IAS/Shunts: No atrial level shunt detected by color flow Doppler. LEFT VENTRICLE PLAX 2D LVIDd:         4.10 cm  Diastology LVIDs:         3.00 cm  LV e' medial:    4.03 cm/s LV PW:         1.40 cm  LV E/e' medial:  20.3 LV IVS:        0.90 cm  LV e' lateral:   5.55 cm/s LVOT diam:     2.00 cm  LV E/e' lateral: 14.7 LV SV:         76 LV SV Index:   43 LVOT Area:     3.14 cm  RIGHT VENTRICLE RV Basal diam:  3.20 cm RV S prime:     14.00 cm/s TAPSE (M-mode): 3.6 cm LEFT ATRIUM             Index       RIGHT ATRIUM           Index LA diam:        4.80 cm 2.68 cm/m  RA Area:     12.60 cm LA Vol (A2C):   81.0 ml 45.16 ml/m RA Volume:   29.30 ml  16.34 ml/m LA Vol (A4C):   73.9 ml 41.20 ml/m LA Biplane Vol: 79.3 ml 44.21 ml/m  AORTIC VALVE                    PULMONIC VALVE AV Area (Vmax):    0.80 cm     PV Vmax:        0.64 m/s AV Area (Vmean):   0.77 cm     PV Peak grad:   1.7 mmHg AV Area (VTI):     0.92 cm     RVOT Peak grad: 1 mmHg AV Vmax:           413.00 cm/s AV Vmean:          300.500 cm/s AV VTI:            0.831 m AV Peak Grad:      68.2 mmHg AV Mean Grad:      41.5 mmHg LVOT Vmax:         105.00 cm/s LVOT Vmean:        73.300 cm/s LVOT VTI:          0.243 m LVOT/AV VTI ratio: 0.29  AORTA Ao Root diam: 3.10 cm MITRAL VALVE                TRICUSPID VALVE MV Area (PHT): 2.18 cm     TR Peak grad:    53.0 mmHg MV Area VTI:   1.68 cm     TR Vmax:  364.00 cm/s MV Peak grad:  7.6 mmHg MV Mean grad:  2.0 mmHg     SHUNTS MV Vmax:       1.38 m/s     Systemic VTI:  0.24 m MV Vmean:      70.6 cm/s    Systemic Diam: 2.00 cm MV Decel Time: 348 msec MV E velocity: 81.80 cm/s MV A velocity: 124.00 cm/s MV E/A ratio:  0.66 Dwayne D Callwood MD Electronically signed by Yolonda Kida MD Signature Date/Time: 01/22/2021/5:06:01 PM    Final         Scheduled Meds:  chlorhexidine gluconate (MEDLINE KIT)  15 mL Mouth Rinse BID   Chlorhexidine Gluconate Cloth  6 each Topical Daily   famotidine (PEPCID) IV  20 mg Intravenous Q24H   heparin  5,000 Units Subcutaneous Q8H   insulin aspart  0-9 Units Subcutaneous Q6H   metoprolol tartrate  10 mg Intravenous Q6H   sodium chloride flush  10-40 mL Intracatheter Q12H   Continuous Infusions:  sodium chloride Stopped (01/23/21 1705)   amiodarone     cefTRIAXone (ROCEPHIN)  IV Stopped (01/23/21 0956)   dextrose     diltiazem (CARDIZEM) infusion 15 mg/hr (01/24/21 0810)   metronidazole 500 mg (01/24/21 0000)   potassium PHOSPHATE IVPB (in mmol) 85 mL/hr at 01/24/21 0810   TPN ADULT (ION) 60 mL/hr at 01/24/21 0810     LOS: 5 days    Time spent: 30 mins     Wyvonnia Dusky, MD [facility] Pager [phone]  If 7PM-7AM, please contact night-coverage 01/24/2021, 8:30 AM

## 2021-01-24 NOTE — Progress Notes (Addendum)
[facility] SURGICAL PROGRESS NOTE  Hospital Day(s): 5.   Post op day(s): 5 Days Post-Op.   Interval History:  Patient seen and examined Overnight, continued having issues with intermittent SVT to 140-170 bpm, needed BiPAP as well Continues to endorse mild incisional soreness; some nausea No fever, chills, emesis Renal function improved; sCr - 1.45; UO - 2.9L Mild hypophosphatemia to 1.6 Surgical drain output 220 ccs; serous  She is on TPN  Vital signs in last 24 hours: [min-max] current  Temp:  [97.8 F (36.6 C)-98.9 F (37.2 C)] 97.9 F (36.6 C) (09/22 0500) Pulse Rate:  [35-148] 64 (09/22 0719) Resp:  [18-54] 25 (09/22 0719) BP: (109-189)/(64-116) 109/87 (09/22 0600) SpO2:  [93 %-100 %] 99 % (09/22 0719) FiO2 (%):  [30 %] 30 % (09/22 0500) Weight:  [61.9 kg] 61.9 kg (09/22 0419)     Height: 5\' 10"  (177.8 cm) Weight: 61.9 kg BMI (Calculated): 19.58   Intake/Output last 2 shifts:  09/21 0701 - 09/22 0700 In: 2386.6 [I.V.:1306.6; IV Piggyback:1080] Out: 3151 [Urine:2930; Drains:220; Stool:1]   Physical Exam:  Constitutional: alert, cooperative and no distress  Respiratory: breathing non-labored at rest; on [place] Cardiovascular: Tachycardic to 150 Gastrointestinal: Soft, incisional soreness, non-distended, no rebound/guarding. Surgical drain in RLQ with serous output; I did strip some clots from tubing today  Integumentary: Laparoscopic and laparotomy incisions are CDI with staples and honeycomb, no appreciable erythema   Labs:  CBC Latest Ref Rng & Units 01/23/2021 01/22/2021 01/21/2021  WBC 4.0 - 10.5 K/uL 17.3(H) 10.4 12.3(H)  Hemoglobin 12.0 - 15.0 g/dL 11.1(L) 9.9(L) 9.8(L)  Hematocrit 36.0 - 46.0 % 32.2(L) 29.8(L) 29.1(L)  Platelets 150 - 400 K/uL 229 182 189   CMP Latest Ref Rng & Units 01/24/2021 01/23/2021 01/23/2021  Glucose 70 - 99 mg/dL 01/04/2021) 338(S) 505(L)  BUN 8 - 23 mg/dL 976(B) 34(L) 93(X)  Creatinine 0.44 - 1.00 mg/dL 90(W) 4.09(B) 3.53(G)   Sodium 135 - 145 mmol/L 145 149(H) 147(H)  Potassium 3.5 - 5.1 mmol/L 3.9 3.4(L) 3.5  Chloride 98 - 111 mmol/L 110 113(H) 112(H)  CO2 22 - 32 mmol/L 24 28 26   Calcium 8.9 - 10.3 mg/dL 9.92(E) 8.3(L) 8.3(L)  Total Protein 6.5 - 8.1 g/dL 6.6 - -  Total Bilirubin 0.3 - 1.2 mg/dL 0.6 - -  Alkaline Phos 38 - 126 U/L 96 - -  AST 15 - 41 U/L 22 - -  ALT 0 - 44 U/L 9 - -     Imaging studies: No new pertinent imaging studies   Assessment/Plan:  87 y.o. female 5 Days Post-Op s/p exploratory laparotomy, small bowel resections x2, open repair of right inguinal hernia for obstruction and volvulus resulting in ischemia   - Continue NPO until certain bowel function returned; she will need to be NPO for a few days given her small bowel anastomoses x2 - Continue TPN  - Continue IV Abx (Ceftriaxone + Flagyl)  - Monitor abdominal examination; on-going bowel function             - Monitor renal function; improved and making urine; continue foley for now  - Engage therapies in next 24-48 hours    - Okay to continue chemical DVT prophylaxis   - Transfer to floor one medically stable   All of the above findings and recommendations were discussed with the patient, and the medical team, and all of patient's questions were answered to her expressed satisfaction.  -- 2.6(S, PA-C [facility] 01/24/2021, 7:20  AM [phone] M-F: 7am - 4pm

## 2021-01-25 ENCOUNTER — Inpatient Hospital Stay: Payer: Medicare Other

## 2021-01-25 DIAGNOSIS — N189 Chronic kidney disease, unspecified: Secondary | ICD-10-CM | POA: Diagnosis not present

## 2021-01-25 DIAGNOSIS — K4030 Unilateral inguinal hernia, with obstruction, without gangrene, not specified as recurrent: Secondary | ICD-10-CM | POA: Diagnosis not present

## 2021-01-25 DIAGNOSIS — J9601 Acute respiratory failure with hypoxia: Secondary | ICD-10-CM

## 2021-01-25 DIAGNOSIS — N179 Acute kidney failure, unspecified: Secondary | ICD-10-CM

## 2021-01-25 LAB — CBC
HCT: 27.1 % — ABNORMAL LOW (ref 36.0–46.0)
Hemoglobin: 9 g/dL — ABNORMAL LOW (ref 12.0–15.0)
MCH: 31.9 pg (ref 26.0–34.0)
MCHC: 33.2 g/dL (ref 30.0–36.0)
MCV: 96.1 fL (ref 80.0–100.0)
Platelets: 244 10*3/uL (ref 150–400)
RBC: 2.82 MIL/uL — ABNORMAL LOW (ref 3.87–5.11)
RDW: 14 % (ref 11.5–15.5)
WBC: 23.3 10*3/uL — ABNORMAL HIGH (ref 4.0–10.5)
nRBC: 0.5 % — ABNORMAL HIGH (ref 0.0–0.2)

## 2021-01-25 LAB — PHOSPHORUS: Phosphorus: 4.3 mg/dL (ref 2.5–4.6)

## 2021-01-25 LAB — BASIC METABOLIC PANEL
Anion gap: 17 — ABNORMAL HIGH (ref 5–15)
BUN: 66 mg/dL — ABNORMAL HIGH (ref 8–23)
CO2: 23 mmol/L (ref 22–32)
Calcium: 8.6 mg/dL — ABNORMAL LOW (ref 8.9–10.3)
Chloride: 109 mmol/L (ref 98–111)
Creatinine, Ser: 1.77 mg/dL — ABNORMAL HIGH (ref 0.44–1.00)
GFR, Estimated: 26 mL/min — ABNORMAL LOW (ref >60)
Glucose, Bld: 149 mg/dL — ABNORMAL HIGH (ref 70–99)
Potassium: 4.2 mmol/L (ref 3.5–5.1)
Sodium: 149 mmol/L — ABNORMAL HIGH (ref 135–145)

## 2021-01-25 LAB — MAGNESIUM: Magnesium: 2.1 mg/dL (ref 1.7–2.4)

## 2021-01-25 LAB — GLUCOSE, CAPILLARY
Glucose-Capillary: 171 mg/dL — ABNORMAL HIGH (ref 70–99)
Glucose-Capillary: 126 mg/dL — ABNORMAL HIGH (ref 70–99)

## 2021-01-25 MED ORDER — HALOPERIDOL LACTATE 5 MG/ML IJ SOLN: 0.5000 mg | INTRAMUSCULAR | Status: DC | PRN

## 2021-01-25 MED ORDER — HALOPERIDOL LACTATE 2 MG/ML PO CONC
0.5000 mg | ORAL | Status: DC | PRN
  Filled 2021-01-25: qty 0.3

## 2021-01-25 MED ORDER — GLYCOPYRROLATE 0.2 MG/ML IJ SOLN: 0.2000 mg | INTRAMUSCULAR | Status: DC | PRN

## 2021-01-25 MED ORDER — GLYCOPYRROLATE 1 MG PO TABS
1.0000 mg | ORAL_TABLET | ORAL | Status: DC | PRN
  Filled 2021-01-25: qty 1

## 2021-01-25 MED ORDER — HALOPERIDOL 0.5 MG PO TABS
0.5000 mg | ORAL_TABLET | ORAL | Status: DC | PRN
  Filled 2021-01-25: qty 1

## 2021-01-25 MED ORDER — SODIUM CHLORIDE 0.9 % IV BOLUS
500.0000 mL | Freq: Once | INTRAVENOUS | Status: AC
  Administered 2021-01-25: 500 mL via INTRAVENOUS

## 2021-01-25 MED ORDER — MORPHINE 100MG IN NS 100ML (1MG/ML) PREMIX INFUSION
5.0000 mg/h | INTRAVENOUS | Status: DC
  Administered 2021-01-25: 5 mg/h via INTRAVENOUS
  Filled 2021-01-25: qty 100

## 2021-02-02 NOTE — Progress Notes (Signed)
Chaplain Maggie offered support to pt and her sister. Room was made for storytelling, prayer and hospitality. Visitation was appreciated. Pt's sister noted, "Thank you for being here. I needed you."

## 2021-02-02 NOTE — Progress Notes (Signed)
[facility] Cardiology    SUBJECTIVE: Currently appears to be in critical condition not responsive to my questioning on BiPAP CPAP with tachypnea relative hypotension as well as tachycardia not able to respond to any of my questions   Vitals:   01/27/2021 0400 01/04/2021 0430 01/03/2021 0500 01/16/2021 0600  BP: 127/71 102/62 100/64 103/64  Pulse: 85 74 80 80  Resp: (!) 22 (!) 24 (!) 23 (!) 25  Temp: 98.5 F (36.9 C)     TempSrc: Axillary     SpO2: 98% 99% 100% 97%  Weight:   61.9 kg   Height:         Intake/Output Summary (Last 24 hours) at 01/06/2021 8250 Last data filed at 01/31/2021 5397 Gross per 24 hour  Intake 3265.15 ml  Output 1875 ml  Net 1390.15 ml      PHYSICAL EXAM  General: Well developed, well nourished, in no acute distress HEENT:  Normocephalic and atramatic Neck:  No JVD.  Lungs: Clear bilaterally to auscultation and percussion. Heart: HRRR . Normal S1 and S2 without gallops or murmurs.  Abdomen: Bowel sounds are positive, abdomen soft and non-tender  Msk:  Back normal, normal gait. Normal strength and tone for age. Extremities: No clubbing, cyanosis or edema.   Neuro: Alert and oriented X 3. Psych:  Good affect, responds appropriately   LABS: Basic Metabolic Panel: Recent Labs    01/23/21 0419 01/23/21 1656 01/23/21 2236 01/24/21 0452  NA 146*   < > 149* 145  K 3.1*   < > 3.4* 3.9  CL 111   < > 113* 110  CO2 27   < > 28 24  GLUCOSE 199*   < > 203* 181*  BUN 54*   < > 51* 52*  CREATININE 1.75*   < > 1.49* 1.45*  CALCIUM 8.1*   < > 8.3* 8.7*  MG 2.3  --   --  2.2  PHOS 1.8*  --   --  1.6*   < > = values in this interval not displayed.   Liver Function Tests: Recent Labs    01/24/21 0452  AST 22  ALT 9  ALKPHOS 96  BILITOT 0.6  PROT 6.6  ALBUMIN 2.5*   No results for input(s): LIPASE, AMYLASE in the last 72 hours. CBC: Recent Labs    01/23/21 1656  WBC 17.3*  HGB 11.1*  HCT 32.2*  MCV 94.7  PLT 229   Cardiac Enzymes: No results for  input(s): CKTOTAL, CKMB, CKMBINDEX, TROPONINI in the last 72 hours. BNP: Invalid input(s): POCBNP D-Dimer: No results for input(s): DDIMER in the last 72 hours. Hemoglobin A1C: No results for input(s): HGBA1C in the last 72 hours. Fasting Lipid Panel: No results for input(s): CHOL, HDL, LDLCALC, TRIG, CHOLHDL, LDLDIRECT in the last 72 hours. Thyroid Function Tests: No results for input(s): TSH, T4TOTAL, T3FREE, THYROIDAB in the last 72 hours.  Invalid input(s): FREET3 Anemia Panel: No results for input(s): VITAMINB12, FOLATE, FERRITIN, TIBC, IRON, RETICCTPCT in the last 72 hours.  DG Chest 1 View  Result Date: 01/23/2021 CLINICAL DATA:  Respiratory distress EXAM: CHEST  1 VIEW COMPARISON:  01/21/2021 FINDINGS: Right upper extremity PICC with tip terminating in the right atrium. Left chest cardiac device with leads in the right atrium and ventricle. Redemonstrated elevation of the right hemidiaphragm with right lung base opacity, likely associated atelectasis. Increased right upper lung opacities. The left lung appears clear. Slight blunting of the left costophrenic angle, possibly a trace pleural effusion. The  heart is moderately enlarged, unchanged mediastinal contours. Previously noted small volume pneumoperitoneum beneath the right hemidiaphragm is no longer seen. No acute osseous abnormality. IMPRESSION: 1. Increased opacities in the right upper lung, which are nonspecific but could represent infection, atelectasis, or edema. 2. Previously noted pneumoperitoneum is no longer seen, possibly secondary to semi-erect positioning. Electronically Signed   By: Wiliam Ke M.D.   On: 01/23/2021 22:22     Echo normal left ventricular function ejection fraction 50 and 55%  TELEMETRY: Rapid atrial fibrillation rate of 05/05/2018 nonspecific findings:  ASSESSMENT AND PLAN:  Principal Problem:   Incarcerated right inguinal hernia Active Problems:   Hypothyroid   Acute kidney injury superimposed  on CKD (HCC)   SBO (small bowel obstruction) (HCC)   Endotracheally intubated   On mechanically assisted ventilation (HCC)   SVT (supraventricular tachycardia) (HCC)   Presence of permanent cardiac pacemaker   Protein-calorie malnutrition, severe    Plan Postop incarcerated hernia surgery Atrial fibrillation rapid ventricular response on amiodarone will increase low-dose metoprolol except there is some hypotension Permanent pacemaker in place but no evidence of bradycardia Patient's had significant arrhythmias in my opinion some atrial fibrillation possibly some SVT hopefully will be better controlled on amiodarone but now needs more rate control but I think with her postoperative state that has been difficult because she is tachycardic Respiratory support for what appears to be mild respiratory failure Cute on chronic renal insufficiency   Alwyn Pea, MD 02-05-21 7:09 AM

## 2021-02-02 NOTE — Progress Notes (Signed)
No changes from previous assessment. Attempted to wean patient from BiPAP, pt became very agitated, tachypnea. Sat dropped to 70% . Pt will continue on BiPAP 02/09/29% for now

## 2021-02-02 NOTE — Progress Notes (Signed)
[facility] SURGICAL PROGRESS NOTE  Hospital Day(s): 6.   Post op day(s): 6 Days Post-Op.   Interval History:  Patient seen and examined Overnight, continued issues with agitation, hypoxia requiring BiPAP, intermittent tachycardia  This morning, she remains on BiPAP, tachycardic and irregular Unable to participate in history Labs are pending UO - 1.8L Surgical drain output 75 ccs; serous  She is on TPN ? Documented BM yesterday  Vital signs in last 24 hours: [min-max] current  Temp:  [97.4 F (36.3 C)-99.3 F (37.4 C)] 98.5 F (36.9 C) (09/23 0400) Pulse Rate:  [40-98] 80 (09/23 0600) Resp:  [21-43] 25 (09/23 0600) BP: (94-148)/(59-114) 103/64 (09/23 0600) SpO2:  [92 %-100 %] 97 % (09/23 0600) FiO2 (%):  [30 %-40 %] 40 % (09/23 0600) Weight:  [61.9 kg] 61.9 kg (09/23 0500)     Height: 5\' 10"  (177.8 cm) Weight: 61.9 kg BMI (Calculated): 19.58   Intake/Output last 2 shifts:  09/22 0701 - 09/23 0700 In: 3265.2 [I.V.:2153.6; IV Piggyback:1111.6] Out: 1875 [Urine:1800; Drains:75]   Physical Exam:  Constitutional: alert, non-participatory; in distress Respiratory: Breathing appears labored; on BiPAP Cardiovascular: Tachycardic to 150; irregular Gastrointestinal: Soft, unable to reliably assess tenderness, non-distended, no rebound/guarding. Surgical drain in RLQ with serous output;  Integumentary: Laparoscopic and laparotomy incisions are CDI with staples and honeycomb, no appreciable erythema   Labs:  CBC Latest Ref Rng & Units 01/23/2021 01/22/2021 01/21/2021  WBC 4.0 - 10.5 K/uL 17.3(H) 10.4 12.3(H)  Hemoglobin 12.0 - 15.0 g/dL 11.1(L) 9.9(L) 9.8(L)  Hematocrit 36.0 - 46.0 % 32.2(L) 29.8(L) 29.1(L)  Platelets 150 - 400 K/uL 229 182 189   CMP Latest Ref Rng & Units 01/24/2021 01/23/2021 01/23/2021  Glucose 70 - 99 mg/dL 01/13/2021) 025(K) 270(W)  BUN 8 - 23 mg/dL 237(S) 28(B) 15(V)  Creatinine 0.44 - 1.00 mg/dL 76(H) 6.07(P) 7.10(G)  Sodium 135 - 145 mmol/L 145  149(H) 147(H)  Potassium 3.5 - 5.1 mmol/L 3.9 3.4(L) 3.5  Chloride 98 - 111 mmol/L 110 113(H) 112(H)  CO2 22 - 32 mmol/L 24 28 26   Calcium 8.9 - 10.3 mg/dL 2.69(S) 8.3(L) 8.3(L)  Total Protein 6.5 - 8.1 g/dL 6.6 - -  Total Bilirubin 0.3 - 1.2 mg/dL 0.6 - -  Alkaline Phos 38 - 126 U/L 96 - -  AST 15 - 41 U/L 22 - -  ALT 0 - 44 U/L 9 - -     Imaging studies: No new pertinent imaging studies   Assessment/Plan:  87 y.o. female 6 Days Post-Op s/p exploratory laparotomy, small bowel resections x2, open repair of right inguinal hernia for obstruction and volvulus resulting in ischemia   - Although there is documented bowel movements, she is certainly not alert enough for PO intake. Continue to reassess if condition improves - Continue TPN; at goal  - Continue IV Abx (Ceftriaxone + Flagyl)  - Monitor abdominal examination; on-going bowel function             - Monitor renal function; improved and making urine; continue foley for now  - Engage therapies if feasible  - Okay to continue chemical DVT prophylaxis  All of the above findings and recommendations were discussed with the patient, and the medical team, and all of patient's questions were answered to her expressed satisfaction.  -- 8.5(I, PA-C [facility] 02/19/2021, 7:37 AM [phone] M-F: 7am - 4pm

## 2021-02-02 NOTE — Discharge Summary (Addendum)
Death Summary  Vanessa Lang KGY:[id number] DOB: 1922-10-07 DOA: Feb 10, 2021  PCP: Dorothey Baseman, MD   Admit date: Feb 10, 2021 Date of Death: Feb 16, 2021  Final Diagnoses:  Principal Problem:   Incarcerated right inguinal hernia Active Problems:   Hypothyroid   Acute kidney injury superimposed on CKD (HCC)   SBO (small bowel obstruction) (HCC)   Endotracheally intubated   On mechanically assisted ventilation (HCC)   SVT (supraventricular tachycardia) (HCC)   Presence of permanent cardiac pacemaker   Protein-calorie malnutrition, severe     History of present illness:  HPI was taken from Dr. Claudine Mouton: Vanessa Lang is a 87 y.o. female with a recent ED visit, just 2 days ago for the same at that time it was successfully reduced.  Unfortunately she had quite immediate recurrence of symptoms and progressive associated nausea and eventually biliary emesis and recurrence of the right groin pain.  Apparently she had a an emergency right inguinal hernia repair 2015.  She required a small bowel resection at that time, and no mesh was utilized.  She has had no flatus or bowel movement.  Denies fevers, chills, dyspnea, chest pain.     I reviewed her CT scan from September 15, today her acute renal injury appears to have worsened.   She has history of HTN, HLP, CHF, Pacemaker placement, and is also s/p prior right inguinal hernia/exlap as mentioned above in 2015, thyroidectomy, and hysterectomy.  She also has had admissions for GI bleed related to diverticular bleed.  As per Dr. Francine Graven: 10-Feb-2021 patient taken urgently to the OR for incarcerated right inguinal hernia repair and small bowel resection x 2, transferred to ICU requiring mechanical ventilation support 9/18 remains on vent 9/19 extubated and off pressors     Hospital course from Dr. Mayford Knife 9/21-09/09/2020: Unfortunately pt continued to decline after I began seeing the pt. Pt was found to have an arrhythmia SVT vs SSS. Pt was  given IV metoprolol w/o much improvement, pt was then started on IV cardizem drip still w/o much improvement and the cardizem drip was changed to IV amio drip. Of note, pt's respiratory status began to decline and pt was placed on BiPAP and IV lasix without much improvement. Repeat CXR on 9/21 showed Increased opacities in the right upper lung, which are nonspecific but could represent infection, atelectasis, or edema. Previously noted pneumoperitoneum is no longer seen, possibly secondary to semi-erect positioning. After a discussion w/ the pt's sister, Vanessa Lang, she decided to make the pt comfort care only. Unfortunately, the pt passed away at 11:10 am.     Hospital Course:   Acute hypoxic respiratory failure: likely secondary to shock due to incarcerated hernia and small bowel resection. Extubated 9/19. Increased oxygen demand & worsening respiratory status 9/22. Repeat CXR shows increased opacities in right upper lung, could represent infection, edema or atelectasis. Continue on IV lasix. Pro-cal 4.51. Will consider changing abxs if respiratory status continues to decline. Continue on supplemental oxygen and wean as tolerated    Arrhythmia: etiology unclear, SVT vs SSS. Continue on IV amio drip. Hx of pacemaker placed for SSS as per cardio. Cardio following and recs apprec   Hypokalemia: WNL today    Shock : resolved. Secondary to incarcerated hernia and small bowel resection. Differential also includes cardiogenic and adrenal insufficiency. Echo shows 50-55%, grade II diastolic dysfunction, moderate aortic stenosis. Weaned off of pressors     Incarcerated right inguinal hernia s/p laparotomy with small bowel resection x 2, repair of right inguinal hernia  for obstruction and volvulus with ischemia.  Continue on IV flagyl, rocephin. Continue on TPN as per general surg. Management as per general surg    AKI: baseline Cr/GFR is unknown.Cr is trending up today    Metabolic Encephalopathy: likely  secondary to shock as stated above. CT head 9/19 negative. Avoid sedating agents   Hypophosphatemia: WNL today    Hypoglycemia: resolved    Hypothyroidism: will restart levothyroxine when pt no longer NPO    Time: time of death 11:10AM  >30 mins  Signed:  Charise Killian  [facility] 01/14/2021, 5:04 PM

## 2021-02-02 NOTE — Plan of Care (Signed)
Patient not progressing toward gaol, unable to wean off BiPAP, pt is very agitated and pulling on tubes and lines when awake. PRN ativan and morphine given x 2 for agitation and pain. As soon med's wears off, Pt with labored breathing, tachypnea, tachy HR 150-160's.  Pt continued on amio gtt, metoprolol give x2 for increase HR. Family was updated last night at bedside.

## 2021-02-02 DEATH — deceased
